# Patient Record
Sex: Female | Born: 1960 | Race: White | Hispanic: No | Marital: Married | Smoking: Former smoker
Health system: Southern US, Community
[De-identification: ages and names within clinical notes are randomized; demographics above are authoritative.]

## PROBLEM LIST (undated history)

## (undated) DIAGNOSIS — I10 Essential (primary) hypertension: Secondary | ICD-10-CM

## (undated) DIAGNOSIS — G629 Polyneuropathy, unspecified: Secondary | ICD-10-CM

## (undated) DIAGNOSIS — K219 Gastro-esophageal reflux disease without esophagitis: Secondary | ICD-10-CM

## (undated) DIAGNOSIS — F419 Anxiety disorder, unspecified: Secondary | ICD-10-CM

## (undated) DIAGNOSIS — J9601 Acute respiratory failure with hypoxia: Secondary | ICD-10-CM

## (undated) DIAGNOSIS — E119 Type 2 diabetes mellitus without complications: Secondary | ICD-10-CM

## (undated) DIAGNOSIS — F329 Major depressive disorder, single episode, unspecified: Secondary | ICD-10-CM

## (undated) DIAGNOSIS — T4145XA Adverse effect of unspecified anesthetic, initial encounter: Secondary | ICD-10-CM

## (undated) DIAGNOSIS — I341 Nonrheumatic mitral (valve) prolapse: Secondary | ICD-10-CM

## (undated) DIAGNOSIS — R6521 Severe sepsis with septic shock: Secondary | ICD-10-CM

## (undated) DIAGNOSIS — D649 Anemia, unspecified: Secondary | ICD-10-CM

## (undated) DIAGNOSIS — D125 Benign neoplasm of sigmoid colon: Secondary | ICD-10-CM

## (undated) DIAGNOSIS — J45909 Unspecified asthma, uncomplicated: Secondary | ICD-10-CM

## (undated) DIAGNOSIS — D696 Thrombocytopenia, unspecified: Secondary | ICD-10-CM

## (undated) DIAGNOSIS — R109 Unspecified abdominal pain: Secondary | ICD-10-CM

## (undated) DIAGNOSIS — R000 Tachycardia, unspecified: Secondary | ICD-10-CM

## (undated) DIAGNOSIS — J449 Chronic obstructive pulmonary disease, unspecified: Secondary | ICD-10-CM

## (undated) DIAGNOSIS — G4730 Sleep apnea, unspecified: Secondary | ICD-10-CM

## (undated) DIAGNOSIS — N200 Calculus of kidney: Secondary | ICD-10-CM

## (undated) DIAGNOSIS — M1990 Unspecified osteoarthritis, unspecified site: Secondary | ICD-10-CM

## (undated) DIAGNOSIS — I493 Ventricular premature depolarization: Secondary | ICD-10-CM

## (undated) DIAGNOSIS — F32A Depression, unspecified: Secondary | ICD-10-CM

## (undated) DIAGNOSIS — A419 Sepsis, unspecified organism: Secondary | ICD-10-CM

## (undated) DIAGNOSIS — K5792 Diverticulitis of intestine, part unspecified, without perforation or abscess without bleeding: Secondary | ICD-10-CM

## (undated) DIAGNOSIS — M5432 Sciatica, left side: Secondary | ICD-10-CM

## (undated) DIAGNOSIS — T8859XA Other complications of anesthesia, initial encounter: Secondary | ICD-10-CM

## (undated) DIAGNOSIS — K5732 Diverticulitis of large intestine without perforation or abscess without bleeding: Secondary | ICD-10-CM

## (undated) DIAGNOSIS — R339 Retention of urine, unspecified: Secondary | ICD-10-CM

## (undated) HISTORY — DX: Diverticulitis of large intestine without perforation or abscess without bleeding: K57.32

## (undated) HISTORY — DX: Acute respiratory failure with hypoxia: J96.01

## (undated) HISTORY — DX: Unspecified abdominal pain: R10.9

## (undated) HISTORY — DX: Ventricular premature depolarization: I49.3

## (undated) HISTORY — PX: IMAGE GUIDED SINUS SURGERY: SHX6570

## (undated) HISTORY — PX: GANGLION CYST EXCISION: SHX1691

## (undated) HISTORY — DX: Calculus of kidney: N20.0

## (undated) HISTORY — DX: Benign neoplasm of sigmoid colon: D12.5

## (undated) HISTORY — DX: Retention of urine, unspecified: R33.9

## (undated) HISTORY — DX: Tachycardia, unspecified: R00.0

## (undated) HISTORY — DX: Gastro-esophageal reflux disease without esophagitis: K21.9

## (undated) HISTORY — DX: Anxiety disorder, unspecified: F41.9

## (undated) HISTORY — DX: Essential (primary) hypertension: I10

## (undated) HISTORY — PX: ABDOMINAL HYSTERECTOMY: SHX81

## (undated) HISTORY — DX: Thrombocytopenia, unspecified: D69.6

## (undated) HISTORY — DX: Anemia, unspecified: D64.9

## (undated) HISTORY — PX: ROTATOR CUFF REPAIR: SHX139

## (undated) HISTORY — DX: Nonrheumatic mitral (valve) prolapse: I34.1

## (undated) HISTORY — PX: COLECTOMY: SHX59

## (undated) HISTORY — DX: Depression, unspecified: F32.A

## (undated) HISTORY — DX: Major depressive disorder, single episode, unspecified: F32.9

---

## 2004-10-25 ENCOUNTER — Ambulatory Visit: Payer: Self-pay | Admitting: Internal Medicine

## 2005-02-24 ENCOUNTER — Emergency Department: Payer: Self-pay | Admitting: Unknown Physician Specialty

## 2005-04-10 ENCOUNTER — Emergency Department: Payer: Self-pay | Admitting: Emergency Medicine

## 2005-04-10 ENCOUNTER — Other Ambulatory Visit: Payer: Self-pay

## 2005-05-03 ENCOUNTER — Emergency Department: Payer: Self-pay | Admitting: Emergency Medicine

## 2005-06-02 ENCOUNTER — Ambulatory Visit (HOSPITAL_COMMUNITY): Admission: RE | Admit: 2005-06-02 | Discharge: 2005-06-02 | Payer: Self-pay | Admitting: Neurosurgery

## 2005-07-02 ENCOUNTER — Ambulatory Visit (HOSPITAL_COMMUNITY): Admission: RE | Admit: 2005-07-02 | Discharge: 2005-07-03 | Payer: Self-pay | Admitting: Neurosurgery

## 2005-08-28 ENCOUNTER — Ambulatory Visit: Payer: Self-pay

## 2005-12-31 ENCOUNTER — Encounter: Admission: RE | Admit: 2005-12-31 | Discharge: 2005-12-31 | Payer: Self-pay | Admitting: Neurosurgery

## 2006-04-01 ENCOUNTER — Ambulatory Visit: Payer: Self-pay

## 2006-06-18 ENCOUNTER — Ambulatory Visit: Payer: Self-pay | Admitting: Internal Medicine

## 2006-08-25 HISTORY — PX: AUGMENTATION MAMMAPLASTY: SUR837

## 2006-10-13 ENCOUNTER — Ambulatory Visit: Payer: Self-pay | Admitting: Specialist

## 2007-02-24 ENCOUNTER — Ambulatory Visit: Payer: Self-pay | Admitting: Urology

## 2007-04-02 ENCOUNTER — Ambulatory Visit: Payer: Self-pay | Admitting: Urology

## 2007-04-02 ENCOUNTER — Emergency Department: Payer: Self-pay | Admitting: Emergency Medicine

## 2007-04-02 ENCOUNTER — Other Ambulatory Visit: Payer: Self-pay

## 2007-04-08 ENCOUNTER — Ambulatory Visit: Payer: Self-pay | Admitting: Urology

## 2007-04-22 ENCOUNTER — Ambulatory Visit: Payer: Self-pay | Admitting: Urology

## 2007-06-22 ENCOUNTER — Ambulatory Visit: Payer: Self-pay | Admitting: Internal Medicine

## 2007-07-15 ENCOUNTER — Ambulatory Visit: Payer: Self-pay | Admitting: Urology

## 2007-07-31 ENCOUNTER — Ambulatory Visit: Payer: Self-pay | Admitting: Cardiothoracic Surgery

## 2007-07-31 ENCOUNTER — Inpatient Hospital Stay (HOSPITAL_COMMUNITY): Admission: EM | Admit: 2007-07-31 | Discharge: 2007-08-02 | Payer: Self-pay | Admitting: Emergency Medicine

## 2007-08-06 ENCOUNTER — Ambulatory Visit: Payer: Self-pay | Admitting: Cardiothoracic Surgery

## 2007-08-06 ENCOUNTER — Encounter: Admission: RE | Admit: 2007-08-06 | Discharge: 2007-08-06 | Payer: Self-pay | Admitting: Cardiothoracic Surgery

## 2007-08-13 ENCOUNTER — Ambulatory Visit: Payer: Self-pay | Admitting: Cardiothoracic Surgery

## 2007-08-13 ENCOUNTER — Encounter: Admission: RE | Admit: 2007-08-13 | Discharge: 2007-08-13 | Payer: Self-pay | Admitting: Cardiothoracic Surgery

## 2007-10-18 ENCOUNTER — Emergency Department: Payer: Self-pay | Admitting: Unknown Physician Specialty

## 2007-12-15 ENCOUNTER — Ambulatory Visit: Payer: Self-pay | Admitting: Urology

## 2008-01-10 ENCOUNTER — Ambulatory Visit: Payer: Self-pay | Admitting: Chiropractor

## 2008-01-10 ENCOUNTER — Emergency Department: Payer: Self-pay | Admitting: Internal Medicine

## 2008-03-28 IMAGING — CR DG ABDOMEN 1V
1 series · 2 of 2 positions shown · non-contrast
Comparison: none

REASON FOR EXAM: NEPHROLITHISIS PT NEED FILMS
COMMENTS:

[Series 1: view not recorded · 0.17mm/px · 2 of 2 slices shown]
[im 1/2]
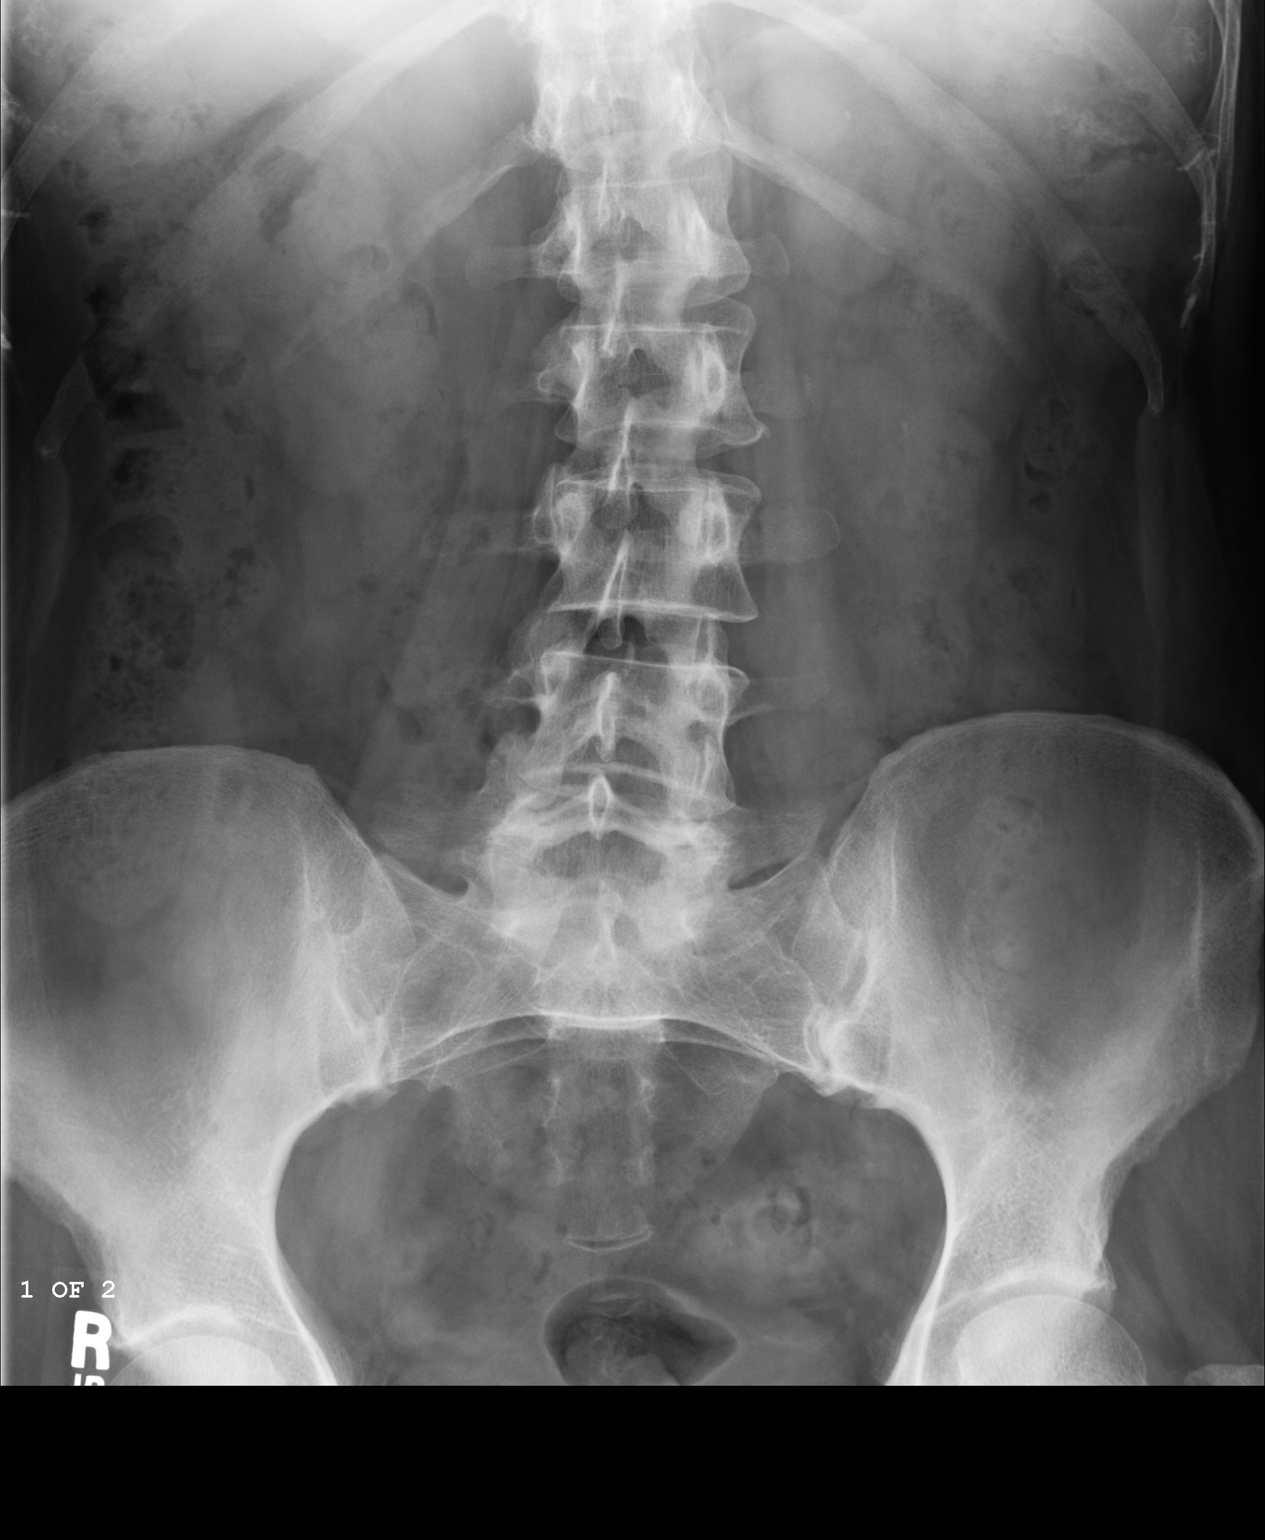
[im 2/2]
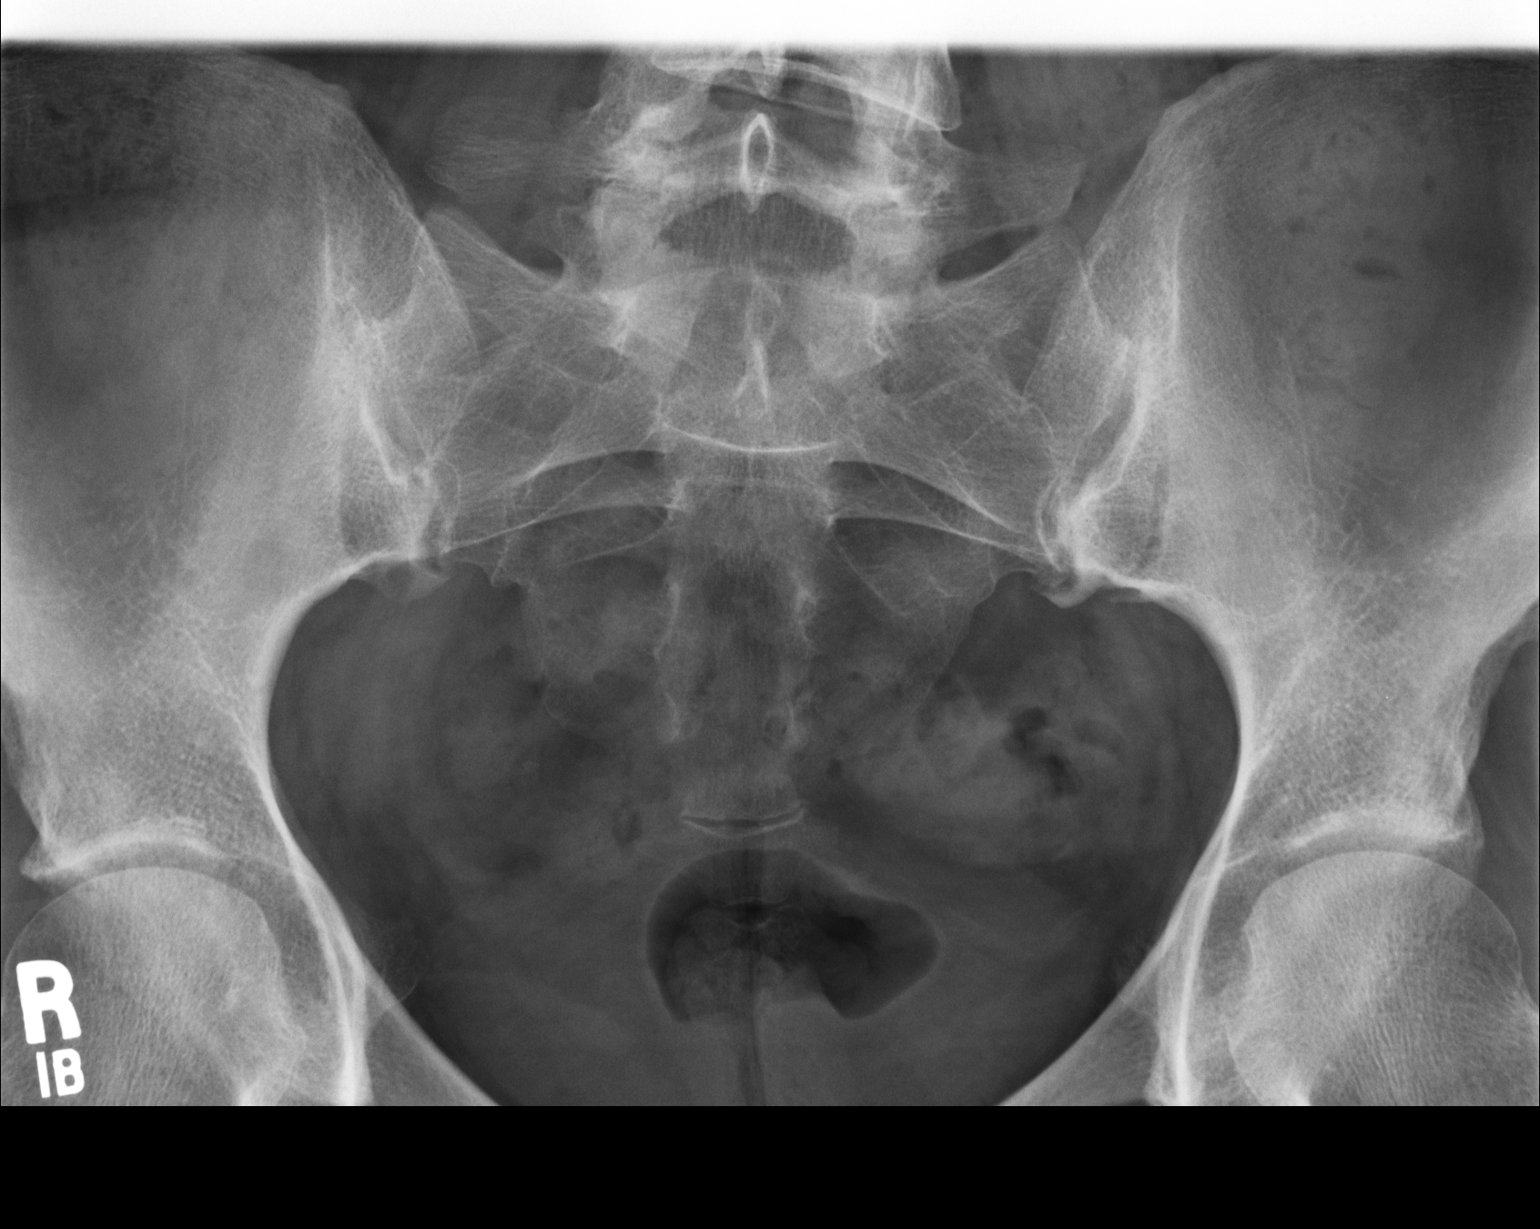

[2 of 2 positions shown; findings below may reference images not displayed]

PROCEDURE:     DXR - DXR KIDNEY URETER BLADDER  - April 22, 2007 [DATE]

RESULT:     Comparison is made to the prior exam of 04/08/07. The previously
present calcifications at the lower pole of the LEFT kidney are less
prominent. There are still a few tiny sand-like calcifications present in
this region.  Additionally, there is again noted a tiny 2-3 mm calcification
in the upper pole of the LEFT kidney. No definite ureteral calcifications
are seen. No RIGHT renal stones are identified on plain film examination.
IMPRESSION: Please see above.

## 2008-05-25 ENCOUNTER — Ambulatory Visit: Payer: Self-pay | Admitting: Urology

## 2008-07-19 IMAGING — CR DG CHEST 2V
2 series · 2 of 2 positions shown · non-contrast
Comparison: 08/06/07.

CLINICAL DATA: Left pneumothorax, short of breath, follow-up.
 CHEST - 2 VIEWS:

[w chest pa]
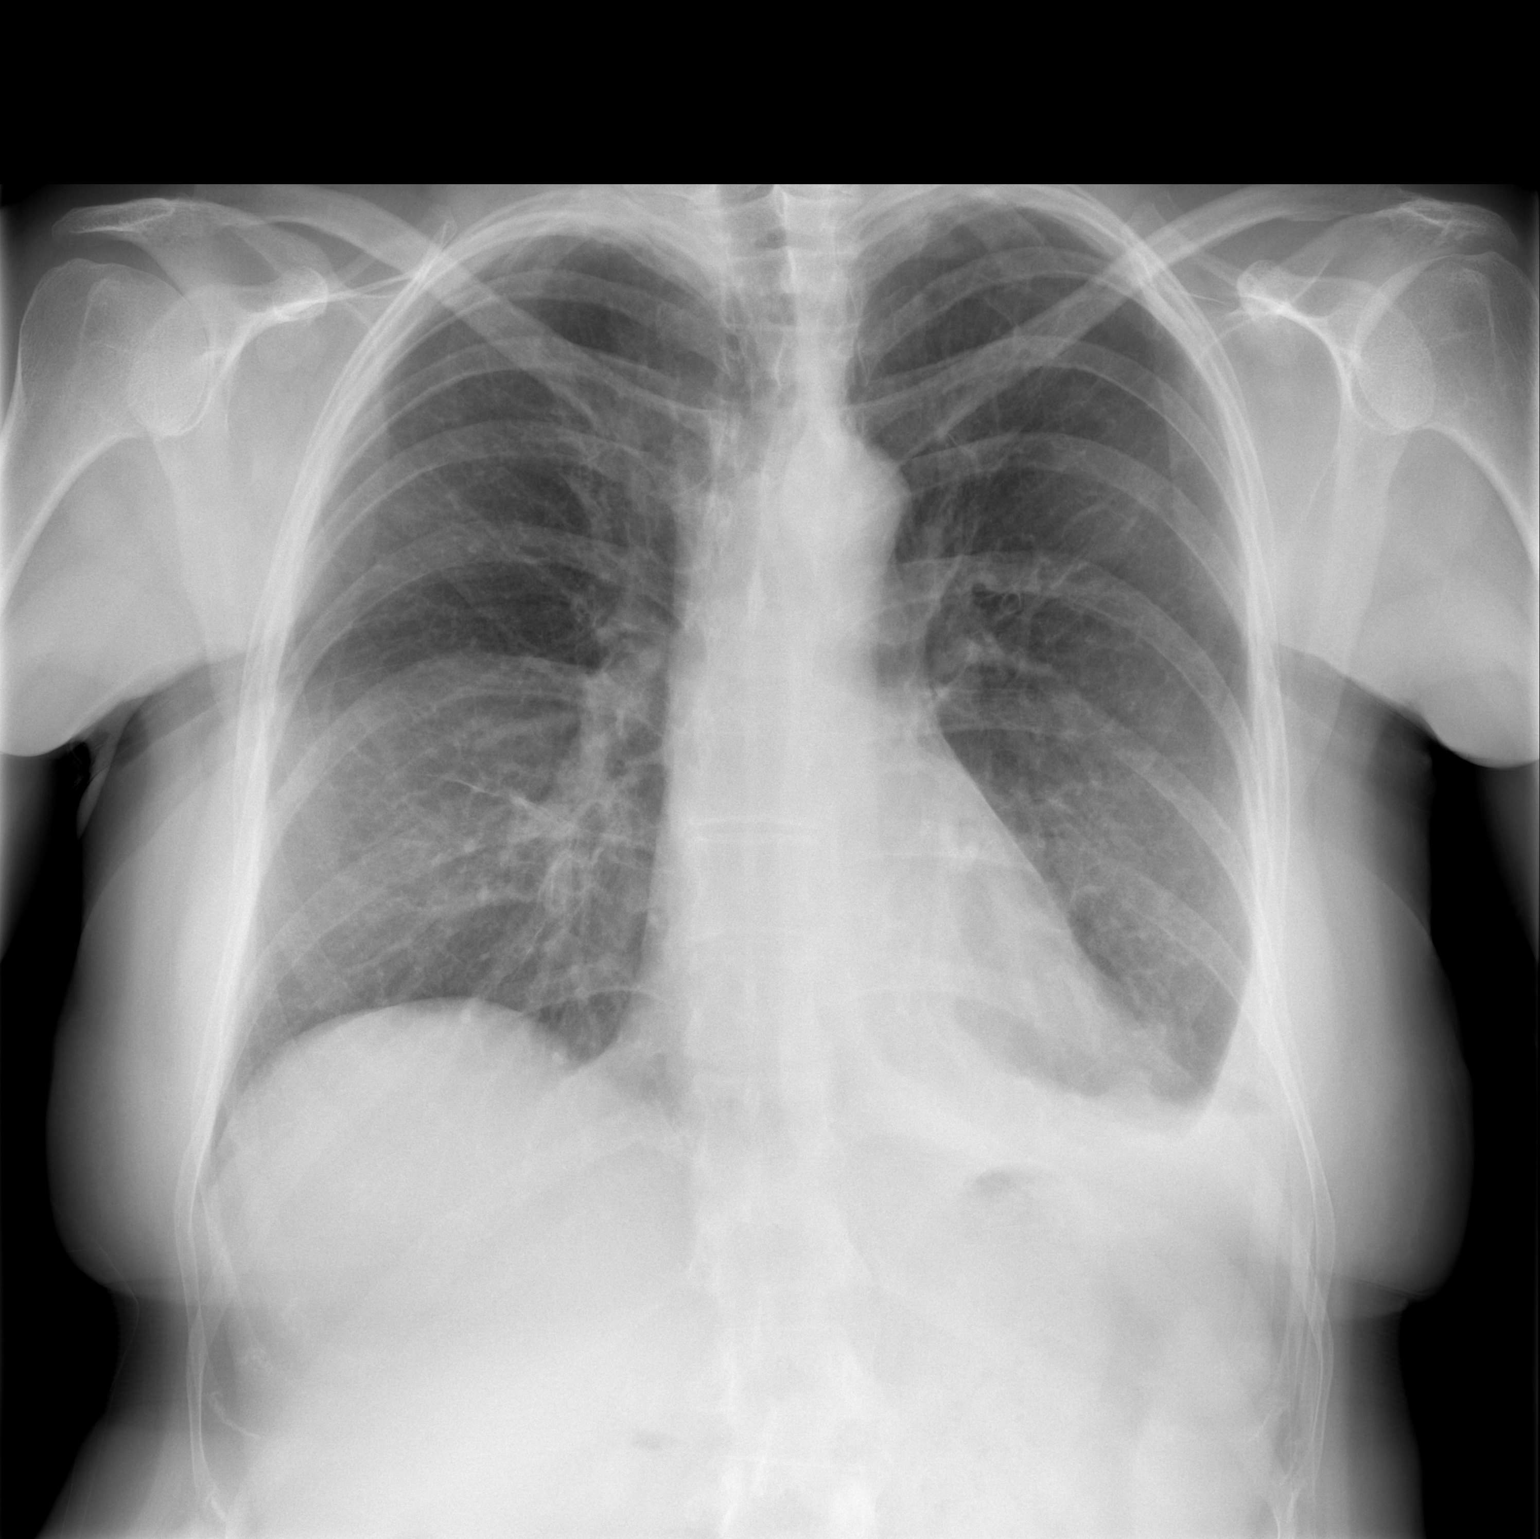

[w chest lat]
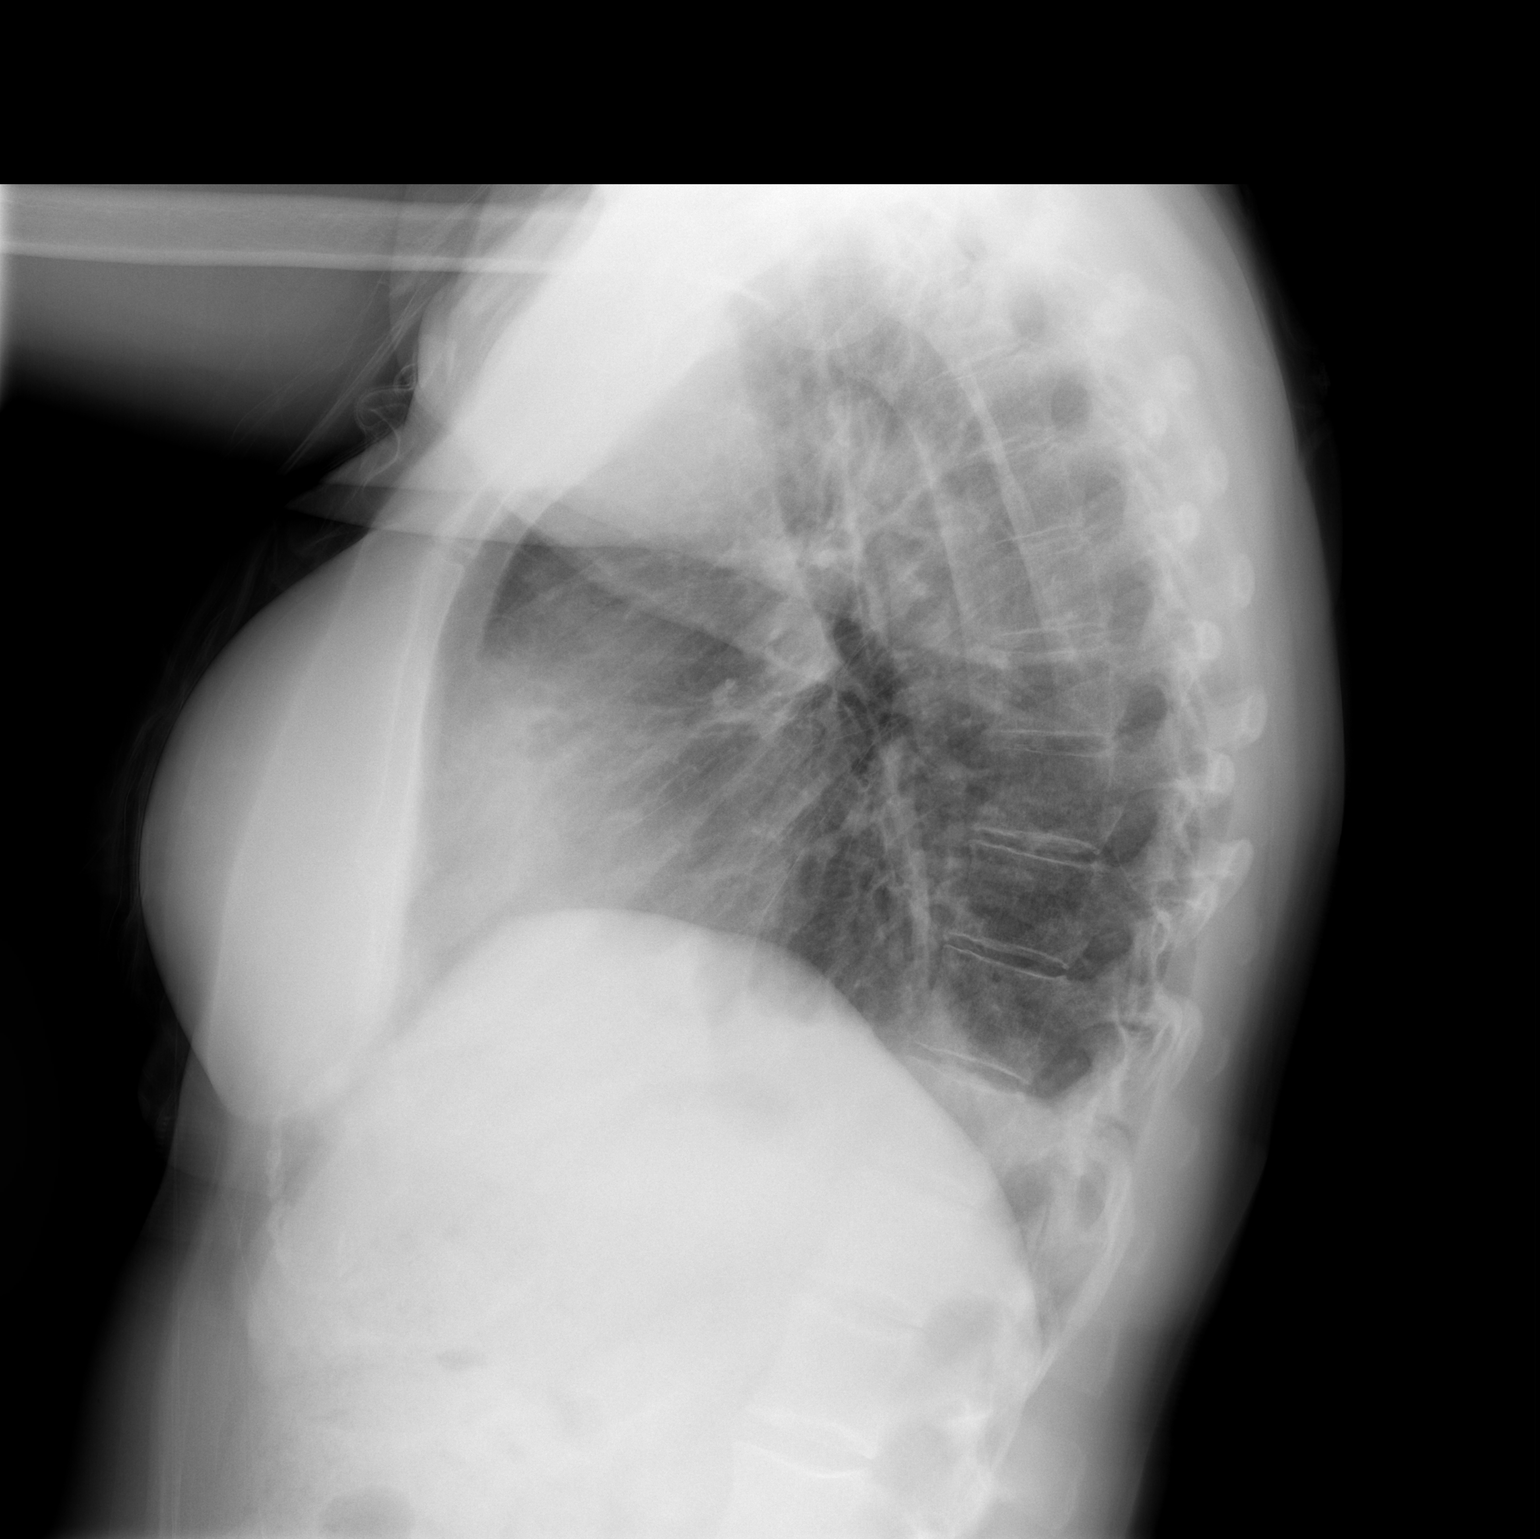

[2 of 2 positions shown; findings below may reference images not displayed]

FINDINGS: Two views of the chest show resolution of the small left pneumothorax noted previously.  Aeration has improved with decreasing basilar atelectasis.  A small left pleural effusion remains.  Heart size is stable.
IMPRESSION: 1.  No pneumothorax.
 2.  Improve aeration.
 3.  Small left effusion remains.

## 2008-12-15 ENCOUNTER — Ambulatory Visit: Payer: Self-pay | Admitting: Internal Medicine

## 2011-01-07 NOTE — Consult Note (Signed)
NAME:  Grace Hester, Grace Hester            ACCOUNT NO.:  [id number]   MEDICAL RECORD NO.:  [id number]          PATIENT TYPE:  INP   LOCATION:  3730                         FACILITY:  [facility]   PHYSICIAN:  Brantley Persons, M.D.DATE OF BIRTH:  Jan 07, 1961   DATE OF CONSULTATION:  07/30/2007  DATE OF DISCHARGE:                                 CONSULTATION   BRIEF HISTORY OF PRESENT ILLNESS:  The patient is a 50 year old  Caucasian female who is status post bilateral augmentation mammoplasty  surgery.  She presents this evening with complaints of some difficulty  breathing, as well as left lower chest/diaphragm pain.  The patient was  sent to the radiology department where a chest x-ray indicates that she  has a small left with pneumothorax.  She is therefore evaluated and will  be admitted.   PAST MEDICAL HISTORY:  1. History of mitral valve prolapse with a tachyarrhythmia that is      well controlled with verapamil.  2. History of kidney stones that in the past have needed removal of      the kidney stones, as well as lithotripsy.  3. History of allergies.  4. History of occasional ankle edema that is controlled with      hydrochlorothiazide.  (Denies other cardiac, lung, liver or kidney disease.)   PAST SURGICAL HISTORY:  1. Excision of ganglion cyst left wrist 8-9 years ago.  2. September 2004 - kidney stone removal.  3. September 2008 - lithotripsy for kidney stones.  4. January 2005 - hysterectomy with removal of one ovary.  5. 2007 - lumbar laminectomy.   CURRENT MEDICATIONS:  1. Verapamil 180 mg p.o. daily.  2. Lexapro 10 mg one p.o. b.i.d.  3. Hydrochlorothiazide 12.5 mg p.o. daily.  4. Fexofenadine 80 mg p.o. daily.  5. Potassium supplement 10 mEq daily.  6. Xanax 0.5 mg p.o. daily p.r.n.  7. Vitamin C 1000 mg p.o. daily.   ALLERGIES:  1. LATEX BAND-AIDS.  2. CATS.  3. DOGS.   SOCIAL HISTORY:  The patient quit smoking about 4 years ago, but she  used to smoke about a  half a pack per day of cigarettes.  Social alcohol  use.   PHYSICAL EXAMINATION:  GENERAL:  Well-developed, well-nourished 46-year-  old Caucasian female in no acute distress.  HEENT:  Normocephalic.  Pupils are equal, round and reactive to light.  Extraocular movements intact.  Oropharynx without erythema.  NECK:  Supple, nontender.  CHEST/LUNGS:  Slightly decreased breath sounds at the left apex with a  few wheezes, but breath sounds are present bilaterally.  Also a few  slightly decreased breath sounds present at the left base.  Right lung  with normal breath sounds.  The patient is wearing her postoperative  support bra from the surgery.  HEART:  Rate and rhythm regular.  EXTREMITIES:  2+ radial and dorsalis pedis pulses present.  NEUROLOGIC:  Cranial nerves II-XII grossly intact.  No focal motor or  sensory deficits.   ACCESSORY CLINICAL DATA:  PA and lateral chest x-ray indicates an  approximately 10-15% left apical pneumothorax, a small amount of fluid  present  at the left lung base, mild atelectasis and some mild increased  pulmonary fluid/edema.   IMPRESSION:  Left pneumothorax with mild pulmonary edema and  atelectasis.  Since the patient is a little clinically symptomatic from  the pneumothorax, it will be appropriate to admit her.  The question is  whether or not to place a chest tube.  Dr. Kathlee Nations Trigt was  consulted, and he agrees that at this time she does not need a chest  tube.  Rather, the patient will be admitted to a 23-hour observation  bed, and her chest x-ray will be repeated in the morning.  She will also  be kept on supplemental oxygen overnight for comfort, as well as to  possibly help the pneumothorax be absorbed.  Due to the pulmonary edema  that is present, I will also give the patient some IV Lasix to help  remove the intraoperative fluid overload that she most likely received.  The patient will be admitted to a telemetry bed to monitor her cardiac   status, as well as she will be kept on pulse oximetry.  When she came in  to the ER, she had an O2 saturation of 92% on room air.  With  supplemental oxygen, she goes up to 96% on 2 liters of oxygen.  If any  point the patient's condition should worsen, then a chest tube may need  to be placed.  She will also be given proper pain relief to assist with  allowing her to breathe better and hopefully help re-expand her lungs.  The patient will also be given an incentive spirometer and encouraged to  use it frequently.  Dr. Donata Clay will be consulting on the patient  while she is in the hospital.  In the ER, she is a comfortable after  being given pain medications, and some of the anxiety that she had with  her shortness of breath has significantly improved.  The patient is  admitted to a 23-hour observation bed, and we will follow her in the  hospital.           ______________________________  Brantley Persons, M.D.     MC/MEDQ  D:  08/01/2007  T:  08/01/2007  Job:  [id number]

## 2011-01-07 NOTE — Discharge Summary (Signed)
NAME:  Grace Hester, Grace Hester            ACCOUNT NO.:  [id number]   MEDICAL RECORD NO.:  [id number]          PATIENT TYPE:  INP   LOCATION:  3730                         FACILITY:  [facility]   PHYSICIAN:  Kerin Perna, M.D.  DATE OF BIRTH:  1961/02/17   DATE OF ADMISSION:  07/30/2007  DATE OF DISCHARGE:                               DISCHARGE SUMMARY   PHYSICIAN REQUESTING CONSULTATION:  Dr. Sherald Hess.   REASON FOR CONSULTATION:  Left pneumothorax.   CHIEF COMPLAINT:  Shortness of breath and chest discomfort.   HISTORY OF PRESENT ILLNESS:  I was asked to evaluate this 50 year old  white female for evaluation and therapy of a recently diagnosed left 10%  to 15% pneumothorax occurring after a bilateral breast augmentation  operation by Dr. Sherald Hess earlier today.  The procedure went  uneventfully under general anesthesia.  There has been no prior history  of pulmonary disease, asthma, thoracic trauma or prior spontaneous  pneumothorax.  Following discharge home, the patient developed some  shortness of breath and left chest discomfort and was evaluated in the  emergency room, where her oxygen saturation on room air was 91%.  A  chest x-ray was taken, which showed a small left apical 10% to 15%  pneumothorax with a small air-fluid level at the costophrenic angle  noted on the lateral view.  There was perhaps some mild interstitial  edema, although the heavy thoracic dressings and breast prostheses  affected the appearance of the lungs on chest x-ray.  The patient was  admitted to the hospital for overnight observation and a followup chest  x-ray was taken this morning.  This shows a stable left 15% pneumothorax  without increased air-fluid level and, in fact, the air-fluid level  appears to be resolving.  She still has discomfort and some pleuritic  pain on the left,  as well as generalized anterior chest wall pain from  the breast implant procedure.  She has a fairly restrictive  thoracic  binder on as well and the incentive spirometer volume is 700-750 mL.   PAST MEDICAL HISTORY:  1. Depression.  2. Mitral valve prolapse.   ALLERGIES:  NO KNOWN DRUG ALLERGIES.   SOCIAL HISTORY:  Nonsmoker, nondrinker, married.   FAMILY HISTORY:  Negative for spontaneous pneumothorax.   REVIEW OF SYSTEMS:  No cardiac arrhythmia associated with mitral valve  prolapse.  No difficulty swallowing or dental problems.  No recent falls  or trauma.  No history of asthma, recent URI or history of abnormal  chest x-ray in the past.  No history of neurologic problems vascular  disease, or endocrine disorder.   PHYSICAL EXAM:  VITAL SIGNS:  Blood pressure is 128/86, pulse 94, sinus,  respirations 20.  Saturation on 2 L is 97% to 98%.  GENERAL APPEARANCE:  This is a middle-aged white female in her hospital  room, anxious and with some pain and discomfort from her chest wall  surgery.  She has a tight thoracic binder around her chest  circumferentially.  HEENT:  Normocephalic.  NECK:  Without crepitus, JVD or mass.  LUNGS:  Breath sounds are clear and equal.  CARDIAC:  Exam reveals regular rhythm without S3 gallop or murmur.  ABDOMEN:  Soft.  EXTREMITIES:  Reveal warm extremities with good pulses and no edema or  tenderness.  NEUROLOGIC:  Exam is intact.   LABORATORY DATA:  I reviewed the chest x-rays from last night and this  morning and she has a stable to small left pneumothorax, probably  related to positive pressure ventilation or increased intrathoracic  pressure that developed during the procedure.   I would recommend conservative management with serial x-rays and  observation and maintain nasal cannula oxygen to help reabsorb the  pneumothorax.  I would not recommend chest tube placement because of the  small size of the pneumothorax, its stable appearance on serial x-ray,  and logistic difficulties of placing a chest tube with the large chest  binder dressing in place.   I feel that the condition should improve over  the next 24-48 hours so that she could followed as an outpatient.   Thank you for the consultation.      Kerin Perna, M.D.  Electronically Signed     PV/MEDQ  D:  07/31/2007  T:  08/01/2007  Job:  [id number]

## 2011-01-07 NOTE — Assessment & Plan Note (Signed)
OFFICE VISIT   Grace Hester, Grace Hester  DOB:  [birth year]                                        August 06, 2007  CHART #:  [id number]   CURRENT PROBLEMS:  1. Left pneumothorax associated with bilateral breast augmentation      07/30/2007 (10-15%).  2. History of depression.  3. History of mitral valve prolapse.   HISTORY OF PRESENT ILLNESS:  The patient is a 50 year old nonsmoker who  underwent bilateral breast augmentation by Dr. Brantley Persons  07/30/2007.  Postoperatively, she had left pleuritic chest pain and  chest x-ray showed a left 10% pneumothorax.  She was hospitalized and  observed over the next 72 hours with improvement in symptoms,  improvement in oxygen saturation, and decreased pleuritic pain.  She  returns now for a followup chest x-ray.  Overall, she feels stronger.  She has been using her incentive spirometer and takes a walk daily.   PHYSICAL EXAM:  Blood pressure 130/80, pulse 98 and regular,  respirations 18, saturation 94% on room air.  She is alert and oriented.  Her breath sounds are clear and equal.  Her pulse is regular and her  neurologic exam is intact.   PA and lateral chest x-ray today shows improved left pneumothorax with a  pneumothorax now at the apex less than 5%.  She has some mild basalar  atelectasis right greater than left lung.   IMPRESSION AND PLAN:  Her spontaneous pneumothorax is gradually  improving.  She can continue her rehab activities, but avoid heavy  lifting, and she will be checked with an x-ray in 1 week.   Kerin Perna, M.Hester.  Electronically Signed   PV/MEDQ  Hester:  08/06/2007  T:  08/06/2007  Job:  [id number]   cc:   Brantley Persons, M.Hester.

## 2011-01-07 NOTE — Assessment & Plan Note (Signed)
OFFICE VISIT   Grace Hester, Grace Hester  DOB:  1961/07/25                                        August 13, 2007  CHART #:  [id number]   Patient presents to our office today for followup of a left pneumothorax  associated with bilateral breast augmentation on July 30, 2007.   Patient is without complaints today.  Denies any chest pain or shortness  of breath.  She is up ambulating well.  Has a follow-up appointment with  Dr. Sherald Hess today.   PHYSICAL EXAMINATION:  Vitals:  Blood pressure 118/72, pulse 107,  respirations 18, O2 sats 96%.  Respiratory:  Clear to auscultation  bilaterally.  Cardiac:  Regular rate and rhythm.  S1 and S2 noted.  Extremities:  All warm to touch.   STUDIES:  Patient's chest x-ray today, August 13, 2007.  The chest x-  ray is clear.  No pneumothorax noted.  No acute findings noted.   IMPRESSION/PLAN:  Patient is seen status post left pneumothorax  associated with bilateral breast augmentation.  On x-ray today, this  appears to have healed completely.  The lung has reexpanded.  Discussed  with patient.  Will release her from our office.  If develops any  complications or questions, such as shortness of breath or chest pain,  contact us.  Patient is to continue following with her plastic surgeon  as directed.   Kerin Perna, M.Hester.  Electronically Signed   KMD/MEDQ  Hester:  08/13/2007  T:  08/14/2007  Job:  [id number]   cc:   Brantley Persons, M.Hester.

## 2011-01-10 NOTE — Op Note (Signed)
NAME:  Grace Hester, Grace Hester NO.:  [id number]   MEDICAL RECORD NO.:  [id number]           PATIENT TYPE:   LOCATION:                                 FACILITY:   PHYSICIAN:  Reinaldo Meeker, M.D.      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   PROCEDURE:  Microdissection L4-5 disk and L5 nerve root.   SURGEON:  Reinaldo Meeker, M.D.   ASSISTANT:  Kathaleen Maser. Pool, M.D.   PROCEDURE IN DETAIL:  After being placed in the prone position, prepped and  draped in the usual sterile fashion. A low-exposure x-ray was taken prior to  incision to identify the appropriate level. A midline incision was made  above the spinous processes of L4 and L5.  Using the Bovie cutting current  the incision was carried down to the spinous processes.  Subperiosteal  dissection was then carried out along the left side of the spinous processes  and lamina and a self-retaining retractor was placed for exposure. X-rays  showed approach at the appropriate level.   Using a high-speed drill the inferior one-half of the L4 lamina and the  medial one-third of the facet joint was removed.  A drill was then used to  remove the superior one-third of the L5 lamina.  The residual bone and  ligamenta flava were removed in a piecemeal fashion.  The microscope was  draped and brought out onto the field, and __________ remainder of the case.   Using micro dissectors, the lateral aspect of the thecal sac and L5 nerve  root were identified.  Physical coagulation was carried out at the L5 nerve  root. At this point, inspection was carried out in all directions for any  evidence of residual compression, and none could be identified.  Large  amounts of irrigation were  carried out.  Interbody staples were placed on  the skin.  Sterile dressing was then applied; and the patient was extubated  and taken to recovery room in stable condition.           ______________________________  Reinaldo Meeker, M.D.     ROK/MEDQ  D:  07/02/2005  T:  07/02/2005  Job:  [id number]

## 2011-01-10 NOTE — Discharge Summary (Signed)
NAME:  Grace Hester, Grace Hester            ACCOUNT NO.:  [id number]   MEDICAL RECORD NO.:  [id number]          PATIENT TYPE:  INP   LOCATION:  3730                         FACILITY:  [facility]   PHYSICIAN:  Brantley Persons, M.D.DATE OF BIRTH:  February 04, 1961   DATE OF ADMISSION:  07/30/2007  DATE OF DISCHARGE:  08/02/2007                               DISCHARGE SUMMARY   ADMITTING DIAGNOSIS:  Left apical pneumothorax.   DISCHARGE DIAGNOSIS:  Residual left apical pneumothorax is clinically  stable.   BRIEF HISTORY OF PRESENT ILLNESS:  The patient is a 50 year old,  Caucasian female, who is status post bilateral augmentation mammoplasty  surgery on July 30, 2007.  She presented on the evening of July 30, 2007, with complaints of difficulty breathing as well as left lower  chest/diaphragmatic pain.  She was seen in the ER for evaluation.  The  procedure went uneventfully under general anesthesia.  After having been  discharged home, the patient had developed some shortness of breath as  well as a left chest discomfort.  On room air, her oxygenation was 91%.  A chest x-ray was taken, which showed a small, apical, 10 to 15%  pneumothorax with a small air fluid level at the left costophrenic angle  noted on the lateral view.  There was also some possible mild  interstitial edema present.  The patient was admitted to the hospital  for overnight observation and a followup chest x-ray was ordered for the  following morning.  She was also placed on cardiac monitoring as well as  supplemental oxygen by nasal cannula.  This helped with reabsorption of  the pneumothorax.   PAST MEDICAL HISTORY:  1. History of mitral valve prolapse with a tachyarrhythmia that is      well-controlled with Verapamil.  2. History of kidney stones and in the past have needed removal of the      kidney stones as well as lithotripsy.  3. History of allergies.  4. History of occasional ankle edema that is controlled  with      hydrochlorothiazide.  5. Denies other cardiac, lung, liver, or kidney disease.   PAST SURGICAL HISTORY:  1. Excision of ganglion cyst, left wrist, eight to nine years ago.  2. September of 2004, kidney stone removal.  3. September of 2008, lithotripsy for kidney stones.  4. January of 2005, hysterectomy with removal of one ovary.  5. 2007, lumbar laminectomy.   CURRENT MEDICATIONS:  1. Verapamil 180 mg p.o. daily.  2. Lexapro 10 mg p.o. b.i.d.  3. Hydrochlorothiazide 12.5 mg p.o. daily.  4. Fexofenadine 80 mg p.o. daily.  5. Potassium supplement 10 mEq daily.  6. Xanax 0.5 mg daily p.o. p.r.n.  7. Vitamin C 1000 mg p.o. daily.   ALLERGIES:  1. LATEX BAND-AID'S.  2. CATS.  3. DOGS.   SOCIAL HISTORY:  The patient quit smoking about four years ago, but she  used to smoke about a half a pack per day of cigarettes.  Social alcohol  use.   HOSPITAL COURSE:  The patient was initially admitted overnight for  observation.  A followup chest x-ray the  morning after admission noted  that she had a persistent left apical pneumothorax along with some mild  pulmonary edema.  She had been given IV Lasix to help with the diuresis,  and although the interstitial edema has been improved, there is still  some present.  However, she did remain a little clinically symptomatic  in that she was still a little short of breath, although she did have  clear breath sounds bilaterally.  She showed no evidence of any cardiac  arrhythmia or instability on her telemetry.  It was decided to change  the patient to a regular admission and continue to monitor her in the  hospital with supplemental nasal oxygen by cannula.  Also, a followup  chest x-ray was going to be once again performed.  We continued with the  patient's ambulation in the hospital.  She did have some issues with  pain control, and once we were able to get her pain under better control  she was able to breathe better and was much  more comfortable.  She did  have surgery on the 5th and so, of course, she had typical pain related  to her breast surgery.  By the morning of August 02, 2007, the patient  was feeling good.  Her pain was under good control.  She was able to  breathe easy and was not short of breath.  She had minimal complaints of  left-sided chest pain and just had appropriate postoperative breast pain  related to her surgery.  On chest x-ray, the left apical pneumothorax  appeared stable and possibly smaller.  She had maintained good oxygen  saturations off of her supplemental oxygen.  Her breath sounds were  clear.  Dr. Donata Clay had been consulted on the patient at the time of  her admission, and he felt that the patient did not need to have a chest  tube inserted as her pneumothorax was stable and the patient was stable,  although she did exhibit some clinical symptoms.  But he felt that it  would be appropriate for her to be discharged home today and that he  would be happy to follow her up clinically in his office later this week  with a repeat chest x-ray and physical exam.  I will follow the patient  as well.  We therefore decided to discharge her home on August 02, 2007.  At the time of her discharge, she was clinically stable without  any difficulty in breathing.  Followup appointment will be with Dr. Donata Clay on Friday, and she will then come to my office on Friday after  having seen Dr. Donata Clay that morning.  The patient is instructed to  call our office or return to the emergency room should she experience  any difficulty in breathing or increase in her left-sided chest pain.           ______________________________  Brantley Persons, M.D.     MC/MEDQ  D:  09/29/2007  T:  09/29/2007  Job:  [id number]

## 2011-04-09 ENCOUNTER — Ambulatory Visit: Payer: Self-pay | Admitting: Family Medicine

## 2011-06-02 LAB — DIFFERENTIAL
Basophils Absolute: 0
Basophils Relative: 0
Eosinophils Absolute: 0 — ABNORMAL LOW
Eosinophils Relative: 0
Lymphocytes Relative: 4 — ABNORMAL LOW
Lymphs Abs: 0.5 — ABNORMAL LOW
Monocytes Absolute: 0.3
Monocytes Relative: 2 — ABNORMAL LOW
Neutro Abs: 13.8 — ABNORMAL HIGH
Neutrophils Relative %: 94 — ABNORMAL HIGH

## 2011-06-02 LAB — BASIC METABOLIC PANEL
BUN: 6
BUN: 7
CO2: 21
CO2: 31
Calcium: 9.3
Calcium: 9.4
Chloride: 101
Chloride: 101
Creatinine, Ser: 0.64
Creatinine, Ser: 0.77
GFR calc Af Amer: >60
GFR calc Af Amer: >60
GFR calc non Af Amer: >60
GFR calc non Af Amer: >60
Glucose, Bld: 106 — ABNORMAL HIGH
Glucose, Bld: 187 — ABNORMAL HIGH
Potassium: 3.4 — ABNORMAL LOW
Potassium: 3.7
Sodium: 134 — ABNORMAL LOW
Sodium: 141

## 2011-06-02 LAB — CBC
HCT: 42
Hemoglobin: 14.6
MCHC: 34.8
MCV: 89.7
Platelets: 222
RBC: 4.68
RDW: 12.6
WBC: 14.7 — ABNORMAL HIGH

## 2011-06-02 LAB — D-DIMER, QUANTITATIVE: D-Dimer, Quant: 1.08 — ABNORMAL HIGH

## 2011-12-19 ENCOUNTER — Ambulatory Visit: Payer: Self-pay | Admitting: Gastroenterology

## 2011-12-22 LAB — PATHOLOGY REPORT

## 2012-01-05 ENCOUNTER — Ambulatory Visit: Payer: Self-pay | Admitting: Family Medicine

## 2012-05-10 ENCOUNTER — Emergency Department: Payer: Self-pay | Admitting: *Deleted

## 2012-05-10 LAB — URINALYSIS, COMPLETE
Bilirubin,UR: NEGATIVE
Blood: NEGATIVE
Glucose,UR: NEGATIVE mg/dL (ref 0–75)
Ketone: NEGATIVE
Nitrite: POSITIVE
Ph: 6 (ref 4.5–8.0)
Protein: 30
RBC,UR: 19 /HPF (ref 0–5)
Specific Gravity: 1.028 (ref 1.003–1.030)
Squamous Epithelial: 1
WBC UR: 46 /HPF (ref 0–5)

## 2012-06-17 ENCOUNTER — Emergency Department: Payer: Self-pay | Admitting: Internal Medicine

## 2012-08-02 ENCOUNTER — Emergency Department: Payer: Self-pay | Admitting: Emergency Medicine

## 2012-08-02 LAB — CBC
HCT: 40.9 % (ref 35.0–47.0)
HGB: 14.4 g/dL (ref 12.0–16.0)
MCH: 30.8 pg (ref 26.0–34.0)
MCHC: 35.3 g/dL (ref 32.0–36.0)
MCV: 87 fL (ref 80–100)
Platelet: 141 10*3/uL — ABNORMAL LOW (ref 150–440)
RBC: 4.69 10*6/uL (ref 3.80–5.20)
RDW: 14.2 % (ref 11.5–14.5)
WBC: 8.1 10*3/uL (ref 3.6–11.0)

## 2012-08-02 LAB — COMPREHENSIVE METABOLIC PANEL
Albumin: 4.1 g/dL (ref 3.4–5.0)
Alkaline Phosphatase: 94 U/L (ref 50–136)
Anion Gap: 9 (ref 7–16)
BUN: 9 mg/dL (ref 7–18)
Bilirubin,Total: 0.7 mg/dL (ref 0.2–1.0)
Calcium, Total: 8.9 mg/dL (ref 8.5–10.1)
Chloride: 108 mmol/L — ABNORMAL HIGH (ref 98–107)
Co2: 25 mmol/L (ref 21–32)
Creatinine: 0.57 mg/dL — ABNORMAL LOW (ref 0.60–1.30)
EGFR (African American): >60
EGFR (Non-African Amer.): >60
Glucose: 157 mg/dL — ABNORMAL HIGH (ref 65–99)
Osmolality: 285 (ref 275–301)
Potassium: 3.5 mmol/L (ref 3.5–5.1)
SGOT(AST): 25 U/L (ref 15–37)
SGPT (ALT): 28 U/L (ref 12–78)
Sodium: 142 mmol/L (ref 136–145)
Total Protein: 7.1 g/dL (ref 6.4–8.2)

## 2012-08-03 LAB — URINALYSIS, COMPLETE
Bilirubin,UR: NEGATIVE
Glucose,UR: NEGATIVE mg/dL (ref 0–75)
Ketone: NEGATIVE
Leukocyte Esterase: NEGATIVE
Nitrite: NEGATIVE
Ph: 6 (ref 4.5–8.0)
Protein: 100
RBC,UR: 55 /HPF (ref 0–5)
Squamous Epithelial: 6
WBC UR: 3 /HPF (ref 0–5)

## 2012-08-03 LAB — CK TOTAL AND CKMB (NOT AT ARMC)
CK, Total: 50 U/L (ref 21–215)
CK-MB: <0.5 ng/mL — ABNORMAL LOW (ref 0.5–3.6)

## 2012-08-03 LAB — LIPASE, BLOOD: Lipase: 149 U/L (ref 73–393)

## 2012-08-03 LAB — TROPONIN I: Troponin-I: <0.02 ng/mL

## 2012-08-06 DIAGNOSIS — N3946 Mixed incontinence: Secondary | ICD-10-CM | POA: Insufficient documentation

## 2012-09-15 DIAGNOSIS — Z87442 Personal history of urinary calculi: Secondary | ICD-10-CM | POA: Insufficient documentation

## 2013-02-07 ENCOUNTER — Ambulatory Visit: Payer: Self-pay | Admitting: Urology

## 2013-02-07 DIAGNOSIS — N200 Calculus of kidney: Secondary | ICD-10-CM

## 2013-02-07 DIAGNOSIS — M549 Dorsalgia, unspecified: Secondary | ICD-10-CM | POA: Insufficient documentation

## 2013-02-07 HISTORY — DX: Calculus of kidney: N20.0

## 2013-03-01 ENCOUNTER — Ambulatory Visit: Payer: Self-pay | Admitting: Orthopedic Surgery

## 2013-07-15 ENCOUNTER — Ambulatory Visit: Payer: Self-pay | Admitting: Podiatry

## 2013-08-04 ENCOUNTER — Ambulatory Visit: Payer: Self-pay | Admitting: Podiatry

## 2013-08-04 HISTORY — PX: FLEXOR TENDON REPAIR: SHX1651

## 2013-08-15 ENCOUNTER — Ambulatory Visit: Payer: Self-pay | Admitting: Urology

## 2013-08-25 DIAGNOSIS — A419 Sepsis, unspecified organism: Secondary | ICD-10-CM

## 2013-08-25 HISTORY — PX: KIDNEY STONE SURGERY: SHX686

## 2013-08-25 HISTORY — DX: Sepsis, unspecified organism: A41.9

## 2013-10-16 ENCOUNTER — Emergency Department: Payer: Self-pay | Admitting: Emergency Medicine

## 2013-10-16 LAB — CBC
HCT: 39.2 % (ref 35.0–47.0)
HGB: 13.6 g/dL (ref 12.0–16.0)
MCH: 30.3 pg (ref 26.0–34.0)
MCHC: 34.7 g/dL (ref 32.0–36.0)
MCV: 87 fL (ref 80–100)
Platelet: 128 10*3/uL — ABNORMAL LOW (ref 150–440)
RBC: 4.5 10*6/uL (ref 3.80–5.20)
RDW: 13.8 % (ref 11.5–14.5)
WBC: 5.2 10*3/uL (ref 3.6–11.0)

## 2013-10-16 LAB — COMPREHENSIVE METABOLIC PANEL
Albumin: 4 g/dL (ref 3.4–5.0)
Alkaline Phosphatase: 76 U/L
Anion Gap: 5 — ABNORMAL LOW (ref 7–16)
BUN: 9 mg/dL (ref 7–18)
Bilirubin,Total: 0.6 mg/dL (ref 0.2–1.0)
Calcium, Total: 8.7 mg/dL (ref 8.5–10.1)
Chloride: 103 mmol/L (ref 98–107)
Co2: 30 mmol/L (ref 21–32)
Creatinine: 0.8 mg/dL (ref 0.60–1.30)
EGFR (African American): >60
EGFR (Non-African Amer.): >60
Glucose: 133 mg/dL — ABNORMAL HIGH (ref 65–99)
Osmolality: 276 (ref 275–301)
Potassium: 3.3 mmol/L — ABNORMAL LOW (ref 3.5–5.1)
SGOT(AST): 31 U/L (ref 15–37)
SGPT (ALT): 36 U/L (ref 12–78)
Sodium: 138 mmol/L (ref 136–145)
Total Protein: 7.1 g/dL (ref 6.4–8.2)

## 2014-01-04 DIAGNOSIS — K219 Gastro-esophageal reflux disease without esophagitis: Secondary | ICD-10-CM

## 2014-01-04 DIAGNOSIS — R079 Chest pain, unspecified: Secondary | ICD-10-CM | POA: Insufficient documentation

## 2014-01-04 HISTORY — DX: Gastro-esophageal reflux disease without esophagitis: K21.9

## 2014-04-17 DIAGNOSIS — R000 Tachycardia, unspecified: Secondary | ICD-10-CM

## 2014-04-17 DIAGNOSIS — R531 Weakness: Secondary | ICD-10-CM | POA: Insufficient documentation

## 2014-04-17 HISTORY — DX: Tachycardia, unspecified: R00.0

## 2014-07-05 DIAGNOSIS — R300 Dysuria: Secondary | ICD-10-CM | POA: Insufficient documentation

## 2014-07-05 DIAGNOSIS — R339 Retention of urine, unspecified: Secondary | ICD-10-CM | POA: Insufficient documentation

## 2014-07-05 HISTORY — DX: Retention of urine, unspecified: R33.9

## 2014-07-07 DIAGNOSIS — N3000 Acute cystitis without hematuria: Secondary | ICD-10-CM | POA: Insufficient documentation

## 2014-07-17 ENCOUNTER — Emergency Department: Payer: Self-pay | Admitting: Emergency Medicine

## 2014-07-17 LAB — TSH: Thyroid Stimulating Horm: 1.39 u[IU]/mL

## 2014-07-17 LAB — CBC WITH DIFFERENTIAL/PLATELET
Basophil #: 0 10*3/uL (ref 0.0–0.1)
Basophil %: 0.5 %
Eosinophil #: 0.1 10*3/uL (ref 0.0–0.7)
Eosinophil %: 1.6 %
HCT: 42.5 % (ref 35.0–47.0)
HGB: 14.7 g/dL (ref 12.0–16.0)
Lymphocyte #: 1.4 10*3/uL (ref 1.0–3.6)
Lymphocyte %: 26.3 %
MCH: 30.7 pg (ref 26.0–34.0)
MCHC: 34.5 g/dL (ref 32.0–36.0)
MCV: 89 fL (ref 80–100)
Monocyte #: 0.3 x10 3/mm (ref 0.2–0.9)
Monocyte %: 6.7 %
Neutrophil #: 3.4 10*3/uL (ref 1.4–6.5)
Neutrophil %: 64.9 %
Platelet: 145 10*3/uL — ABNORMAL LOW (ref 150–440)
RBC: 4.79 10*6/uL (ref 3.80–5.20)
RDW: 13.9 % (ref 11.5–14.5)
WBC: 5.2 10*3/uL (ref 3.6–11.0)

## 2014-07-17 LAB — BASIC METABOLIC PANEL
Anion Gap: 7 (ref 7–16)
BUN: 9 mg/dL (ref 7–18)
Calcium, Total: 9.4 mg/dL (ref 8.5–10.1)
Chloride: 104 mmol/L (ref 98–107)
Co2: 28 mmol/L (ref 21–32)
Creatinine: 0.71 mg/dL (ref 0.60–1.30)
EGFR (African American): >60
EGFR (Non-African Amer.): >60
Glucose: 142 mg/dL — ABNORMAL HIGH (ref 65–99)
Osmolality: 279 (ref 275–301)
Potassium: 3.9 mmol/L (ref 3.5–5.1)
Sodium: 139 mmol/L (ref 136–145)

## 2014-07-17 LAB — URINALYSIS, COMPLETE
Bacteria: NONE SEEN
Bilirubin,UR: NEGATIVE
Blood: NEGATIVE
Glucose,UR: NEGATIVE mg/dL (ref 0–75)
Hyaline Cast: 2
Ketone: NEGATIVE
Leukocyte Esterase: NEGATIVE
Nitrite: NEGATIVE
Ph: 7 (ref 4.5–8.0)
Protein: NEGATIVE
RBC,UR: 13 /HPF (ref 0–5)
Specific Gravity: 1.013 (ref 1.003–1.030)
Squamous Epithelial: <1
WBC UR: 1 /HPF (ref 0–5)

## 2014-07-17 LAB — CLOSTRIDIUM DIFFICILE(ARMC)

## 2014-07-17 LAB — TROPONIN I: Troponin-I: <0.02 ng/mL

## 2014-09-18 DIAGNOSIS — R6521 Severe sepsis with septic shock: Secondary | ICD-10-CM

## 2014-09-18 DIAGNOSIS — A419 Sepsis, unspecified organism: Secondary | ICD-10-CM | POA: Insufficient documentation

## 2014-09-20 DIAGNOSIS — D696 Thrombocytopenia, unspecified: Secondary | ICD-10-CM | POA: Insufficient documentation

## 2014-09-20 DIAGNOSIS — J9601 Acute respiratory failure with hypoxia: Secondary | ICD-10-CM

## 2014-09-20 HISTORY — DX: Acute respiratory failure with hypoxia: J96.01

## 2014-09-20 HISTORY — DX: Thrombocytopenia, unspecified: D69.6

## 2014-09-27 DIAGNOSIS — A498 Other bacterial infections of unspecified site: Secondary | ICD-10-CM | POA: Insufficient documentation

## 2015-01-10 DIAGNOSIS — I10 Essential (primary) hypertension: Secondary | ICD-10-CM

## 2015-01-10 HISTORY — DX: Essential (primary) hypertension: I10

## 2015-02-28 ENCOUNTER — Other Ambulatory Visit: Payer: Self-pay | Admitting: Family Medicine

## 2015-02-28 DIAGNOSIS — Z9882 Breast implant status: Secondary | ICD-10-CM

## 2015-02-28 DIAGNOSIS — Z1231 Encounter for screening mammogram for malignant neoplasm of breast: Secondary | ICD-10-CM

## 2015-03-02 ENCOUNTER — Other Ambulatory Visit: Payer: Self-pay | Admitting: Family Medicine

## 2015-03-02 ENCOUNTER — Ambulatory Visit
Admission: RE | Admit: 2015-03-02 | Discharge: 2015-03-02 | Disposition: A | Discharge status: Home / Self Care | Payer: 59 | Source: Ambulatory Visit | Attending: Family Medicine | Admitting: Family Medicine

## 2015-03-02 DIAGNOSIS — Z9882 Breast implant status: Secondary | ICD-10-CM | POA: Diagnosis not present

## 2015-03-02 DIAGNOSIS — Z1231 Encounter for screening mammogram for malignant neoplasm of breast: Secondary | ICD-10-CM | POA: Insufficient documentation

## 2015-03-26 ENCOUNTER — Ambulatory Visit
Admission: RE | Admit: 2015-03-26 | Discharge: 2015-03-26 | Disposition: A | Discharge status: Home / Self Care | Payer: 59 | Source: Ambulatory Visit | Attending: Chiropractor | Admitting: Chiropractor

## 2015-03-26 ENCOUNTER — Other Ambulatory Visit: Payer: Self-pay | Admitting: Chiropractor

## 2015-03-26 DIAGNOSIS — M25512 Pain in left shoulder: Secondary | ICD-10-CM | POA: Diagnosis not present

## 2015-03-26 DIAGNOSIS — M25511 Pain in right shoulder: Secondary | ICD-10-CM

## 2015-03-26 DIAGNOSIS — I10 Essential (primary) hypertension: Secondary | ICD-10-CM | POA: Insufficient documentation

## 2015-04-28 DIAGNOSIS — J189 Pneumonia, unspecified organism: Secondary | ICD-10-CM | POA: Insufficient documentation

## 2015-05-16 ENCOUNTER — Other Ambulatory Visit: Payer: Self-pay | Admitting: Family Medicine

## 2015-05-16 ENCOUNTER — Ambulatory Visit
Admission: RE | Admit: 2015-05-16 | Discharge: 2015-05-16 | Disposition: A | Discharge status: Home / Self Care | Payer: 59 | Source: Ambulatory Visit | Attending: Family Medicine | Admitting: Family Medicine

## 2015-05-16 DIAGNOSIS — Z8701 Personal history of pneumonia (recurrent): Secondary | ICD-10-CM | POA: Insufficient documentation

## 2015-05-16 DIAGNOSIS — J189 Pneumonia, unspecified organism: Secondary | ICD-10-CM

## 2015-05-16 DIAGNOSIS — Z09 Encounter for follow-up examination after completed treatment for conditions other than malignant neoplasm: Secondary | ICD-10-CM | POA: Diagnosis not present

## 2015-07-12 DIAGNOSIS — N3020 Other chronic cystitis without hematuria: Secondary | ICD-10-CM | POA: Insufficient documentation

## 2015-10-25 ENCOUNTER — Ambulatory Visit
Admission: RE | Admit: 2015-10-25 | Discharge: 2015-10-25 | Disposition: A | Discharge status: Home / Self Care | Payer: 59 | Source: Ambulatory Visit | Attending: Family Medicine | Admitting: Family Medicine

## 2015-10-25 ENCOUNTER — Other Ambulatory Visit: Payer: Self-pay | Admitting: Family Medicine

## 2015-10-25 DIAGNOSIS — R05 Cough: Secondary | ICD-10-CM

## 2015-10-25 DIAGNOSIS — R059 Cough, unspecified: Secondary | ICD-10-CM

## 2016-01-15 DIAGNOSIS — K591 Functional diarrhea: Secondary | ICD-10-CM | POA: Insufficient documentation

## 2016-01-15 DIAGNOSIS — R1011 Right upper quadrant pain: Secondary | ICD-10-CM | POA: Insufficient documentation

## 2016-02-13 ENCOUNTER — Other Ambulatory Visit: Payer: Self-pay

## 2016-02-13 DIAGNOSIS — D649 Anemia, unspecified: Secondary | ICD-10-CM | POA: Insufficient documentation

## 2016-02-13 DIAGNOSIS — I493 Ventricular premature depolarization: Secondary | ICD-10-CM | POA: Insufficient documentation

## 2016-02-13 DIAGNOSIS — I341 Nonrheumatic mitral (valve) prolapse: Secondary | ICD-10-CM | POA: Insufficient documentation

## 2016-02-13 DIAGNOSIS — F419 Anxiety disorder, unspecified: Secondary | ICD-10-CM | POA: Insufficient documentation

## 2016-02-14 ENCOUNTER — Ambulatory Visit (INDEPENDENT_AMBULATORY_CARE_PROVIDER_SITE_OTHER): Payer: 59 | Admitting: Gastroenterology

## 2016-02-14 ENCOUNTER — Encounter: Payer: Self-pay | Admitting: Gastroenterology

## 2016-02-14 ENCOUNTER — Other Ambulatory Visit: Payer: Self-pay

## 2016-02-14 VITALS — BP 148/86 | HR 86 | Temp 97.0°F | Ht 71.0 in | Wt 226.0 lb

## 2016-02-14 DIAGNOSIS — R197 Diarrhea, unspecified: Secondary | ICD-10-CM | POA: Diagnosis not present

## 2016-02-14 DIAGNOSIS — R112 Nausea with vomiting, unspecified: Secondary | ICD-10-CM

## 2016-02-14 DIAGNOSIS — R1011 Right upper quadrant pain: Secondary | ICD-10-CM

## 2016-02-14 NOTE — Progress Notes (Signed)
Gastroenterology Consultation  Referring Provider:     Lynnell Jude, MD Primary Care Physician:  Lynnell Jude, MD Primary Gastroenterologist:  Dr. Allen Norris     Reason for Consultation:     Multiple GI symptoms        HPI:   Grace Hester is a 55 y.o. y/o female referred for consultation & management of Multiple GI symptoms by Dr. Clemmie Krill, Lynnell Jude, MD.  This patient comes in today with a history of nausea and vomiting with fatty foods or greasy foods. The patient also reports that she had an ultrasound that showed an enlarged liver. The patient has had diarrhea for the last 6 months. She denies any unexplained weight loss. The patient's history includes lithotripsy about a year ago which resulted in sepsis and admission to the ICU. The patient states that she now has a watery bowel movement every one to 1-1/2 hours. She also reports that she will wake up a few times during the night to have a bowel movement. She will sometimes lose some liquid when she passes gas. The patient also has abdominal pain in the right side of her abdomen. The patient states she had an ultrasound that showed an enlarged liver. This pain is made better by her gabapentin which she takes for her leg. There is no report of any black stools or bloody stools. She also denies any fevers or chills.  Past Medical History  Diagnosis Date  . Abdominal pain   . Anemia   . Anxiety   . Hypertension   . Kidney stones   . GERD (gastroesophageal reflux disease)   . Billowing mitral valve   . PVC (premature ventricular contraction)   . Depression     Past Surgical History  Procedure Laterality Date  . Augmentation mammaplasty  2008    Prior to Admission medications   Medication Sig Start Date End Date Taking? Authorizing Provider  ARIPiprazole (ABILIFY) 2 MG tablet Take 2 mg by mouth daily.  06/18/15  Yes Historical Provider, MD  esomeprazole (NEXIUM) 20 MG capsule Take 20 mg by mouth daily at 12 noon.    Yes  Historical Provider, MD  fexofenadine (ALLEGRA) 180 MG tablet Take 180 mg by mouth daily.    Yes Historical Provider, MD  gabapentin (NEURONTIN) 300 MG capsule Take 300 mg by mouth 3 (three) times daily.  10/30/14  Yes Historical Provider, MD  hydrochlorothiazide (HYDRODIURIL) 12.5 MG tablet Take 12.5 mg by mouth 2 (two) times daily.  04/08/15  Yes Historical Provider, MD  metoprolol succinate (TOPROL-XL) 50 MG 24 hr tablet Take 50 mg by mouth daily.  12/29/14  Yes Historical Provider, MD  sertraline (ZOLOFT) 100 MG tablet Take 100 mg by mouth at bedtime.  10/30/14  Yes Historical Provider, MD    Family History  Problem Relation Age of Onset  . Heart disease Mother   . Stroke Mother   . Heart disease Father      Social History  Substance Use Topics  . Smoking status: Former Smoker    Quit date: 01/27/2000  . Smokeless tobacco: Never Used  . Alcohol Use: No    Allergies as of 02/14/2016 - Review Complete 02/14/2016  Allergen Reaction Noted  . Latex Other (See Comments) 02/13/2016  . Tape  02/13/2016    Review of Systems:    All systems reviewed and negative except where noted in HPI.   Physical Exam:  BP 148/86 mmHg  Pulse 86  Temp(Src) 97  F (36.1 C) (Oral)  Ht 5\' 11"  (1.803 m)  Wt 226 lb (102.513 kg)  BMI 31.53 kg/m2 No LMP recorded. Patient has had a hysterectomy. Psych:  Alert and cooperative. Normal mood and affect. General:   Alert,  Well-developed, well-nourished, pleasant and cooperative in NAD Head:  Normocephalic and atraumatic. Eyes:  Sclera clear, no icterus.   Conjunctiva pink. Ears:  Normal auditory acuity. Nose:  No deformity, discharge, or lesions. Mouth:  No deformity or lesions,oropharynx pink & moist. Neck:  Supple; no masses or thyromegaly. Lungs:  Respirations even and unlabored.  Clear throughout to auscultation.   No wheezes, crackles, or rhonchi. No acute distress. Heart:  Regular rate and rhythm; no murmurs, clicks, rubs, or gallops. Abdomen:   Normal bowel sounds.  No bruits.  Soft, Positive tenderness with 1 finger palpation while raising the legs 6 inches above the exam table, nd non-distended without masses, hepatosplenomegaly or hernias noted.  No guarding or rebound tenderness.  Positive Carnett sign.   Rectal:  Deferred.  Msk:  Symmetrical without gross deformities.  Good, equal movement & strength bilaterally. Pulses:  Normal pulses noted. Extremities:  No clubbing or edema.  No cyanosis. Neurologic:  Alert and oriented x3;  grossly normal neurologically. Skin:  Intact without significant lesions or rashes.  No jaundice. Lymph Nodes:  No significant cervical adenopathy. Psych:  Alert and cooperative. Normal mood and affect.  Imaging Studies: No results found.  Assessment and Plan:   [name] is a 55 y.o. y/o female who comes in today with abdominal pain that is clearly musculoskeletal and reproducible with 1 finger palpation with raising the legs of the patient 6 inches above the exam table. The patient's pain is exacerbated with just the straight leg lift without even palpating the abdominal wall. The patient does have nausea and vomiting to fatty foods and has had 6 months of diarrhea. For this the patient will be set up for an EGD and colonoscopy. The patient will also have a gallbladder emptying study to see if her gallbladder is doing well. She will also have her blood sent off for possible causes of abnormal liver ultrasound.   Note: This dictation was prepared with Dragon dictation along with smaller phrase technology. Any transcriptional errors that result from this process are unintentional.

## 2016-02-15 ENCOUNTER — Other Ambulatory Visit: Payer: Self-pay

## 2016-02-15 LAB — HEPATIC FUNCTION PANEL
ALT: 29 IU/L (ref 0–32)
AST: 27 IU/L (ref 0–40)
Albumin: 4.7 g/dL (ref 3.5–5.5)
Alkaline Phosphatase: 78 IU/L (ref 39–117)
BILIRUBIN TOTAL: 0.5 mg/dL (ref 0.0–1.2)
Bilirubin, Direct: 0.14 mg/dL (ref 0.00–0.40)
Total Protein: 6.6 g/dL (ref 6.0–8.5)

## 2016-02-15 LAB — MITOCHONDRIAL ANTIBODIES: Mitochondrial Ab: 5.6 Units (ref 0.0–20.0)

## 2016-02-15 LAB — HEPATITIS A ANTIBODY, TOTAL: HEP A TOTAL AB: POSITIVE — AB

## 2016-02-15 LAB — ANTI-SMOOTH MUSCLE ANTIBODY, IGG: SMOOTH MUSCLE AB: 13 U (ref 0–19)

## 2016-02-15 LAB — ALPHA-1-ANTITRYPSIN: A1 ANTITRYPSIN: 141 mg/dL (ref 90–200)

## 2016-02-15 LAB — HEPATITIS B SURFACE ANTIBODY,QUALITATIVE: HEP B SURFACE AB, QUAL: NONREACTIVE

## 2016-02-15 LAB — HEPATITIS B SURFACE ANTIGEN: HEP B S AG: NEGATIVE

## 2016-02-15 LAB — HEPATITIS C ANTIBODY

## 2016-02-15 LAB — ANA: Anti Nuclear Antibody(ANA): NEGATIVE

## 2016-02-15 LAB — CERULOPLASMIN: CERULOPLASMIN: 40.6 mg/dL — AB (ref 19.0–39.0)

## 2016-02-20 ENCOUNTER — Telehealth: Payer: Self-pay

## 2016-02-20 NOTE — Telephone Encounter (Signed)
-----   Message from Lucilla Lame, MD sent at 02/18/2016 12:43 PM EDT ----- Let the patient know that her liver enzymes were normal as was all the other blood work for her liver disease.

## 2016-02-20 NOTE — Telephone Encounter (Signed)
LVM with lab results. Advised pt to return my call with any questions.

## 2016-02-27 ENCOUNTER — Ambulatory Visit: Payer: 59 | Admitting: Gastroenterology

## 2016-03-01 IMAGING — CR DG SHOULDER 2+V*L*
1 series · 3 of 3 positions shown · non-contrast
Comparison: Chest x-ray dated October 16, 2013 which revealed
portions of the shoulders

CLINICAL DATA: Bilateral shoulder pain numbness and weakness with
week grip and limited range of motion

EXAM:
LEFT SHOULDER - 2+ VIEW; RIGHT SHOULDER - 2+ VIEW

[Series 1: dg shoulder left · 0.14mm/px · 3 of 3 slices shown]
[im 1/3]
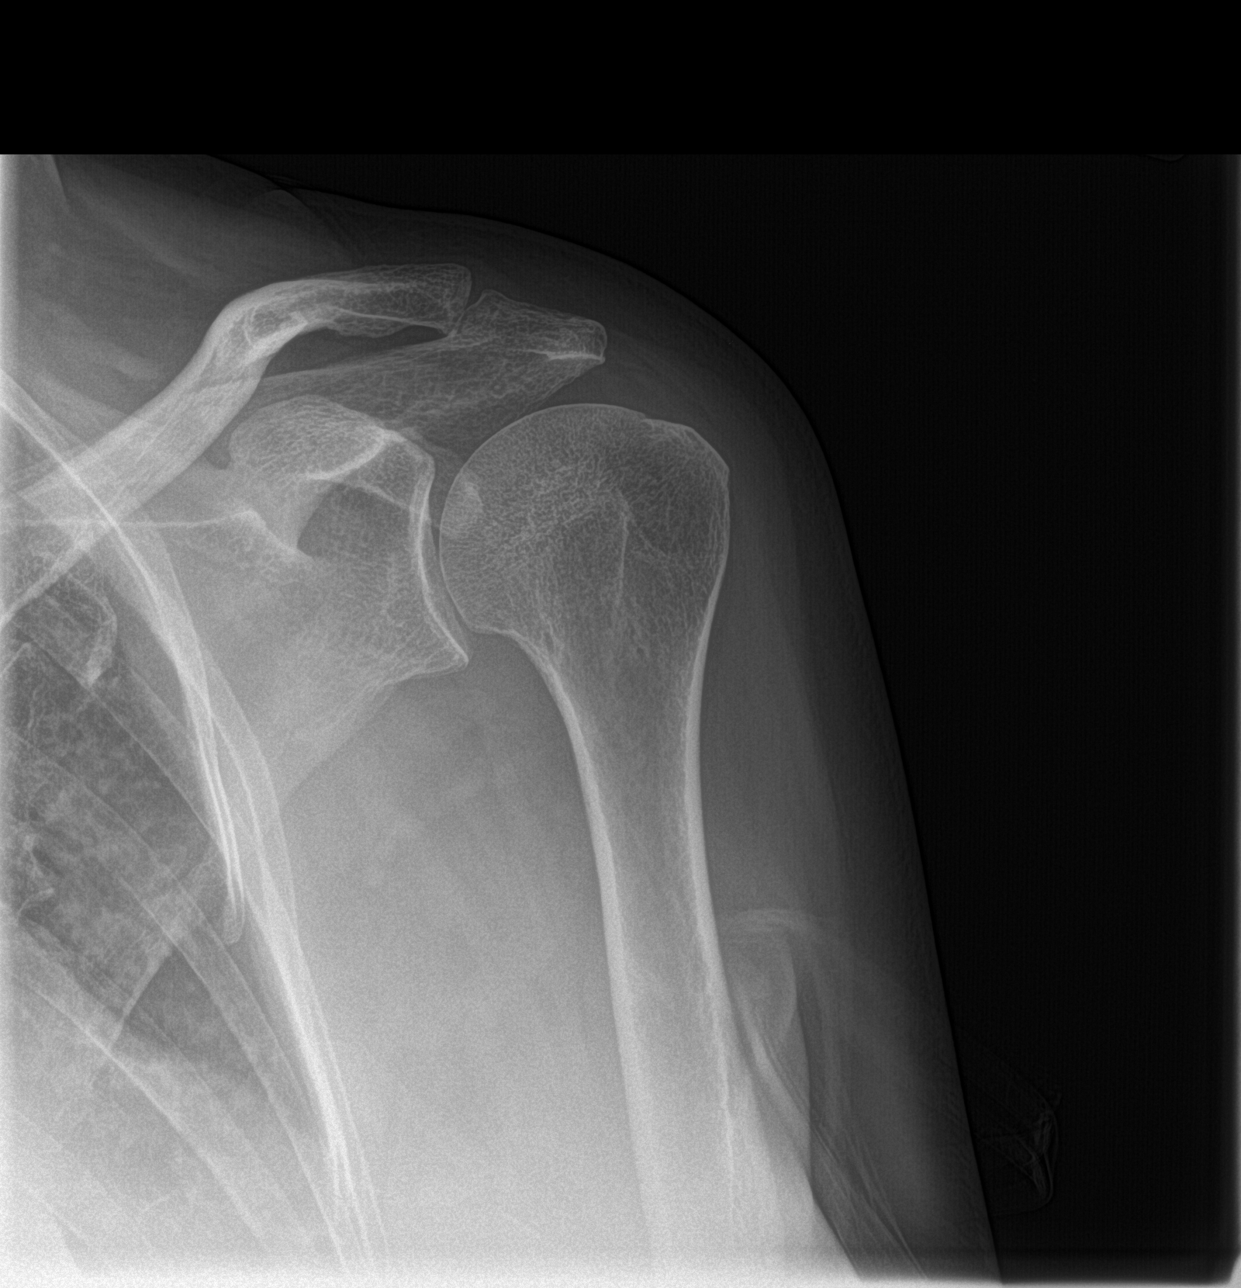
[im 2/3]
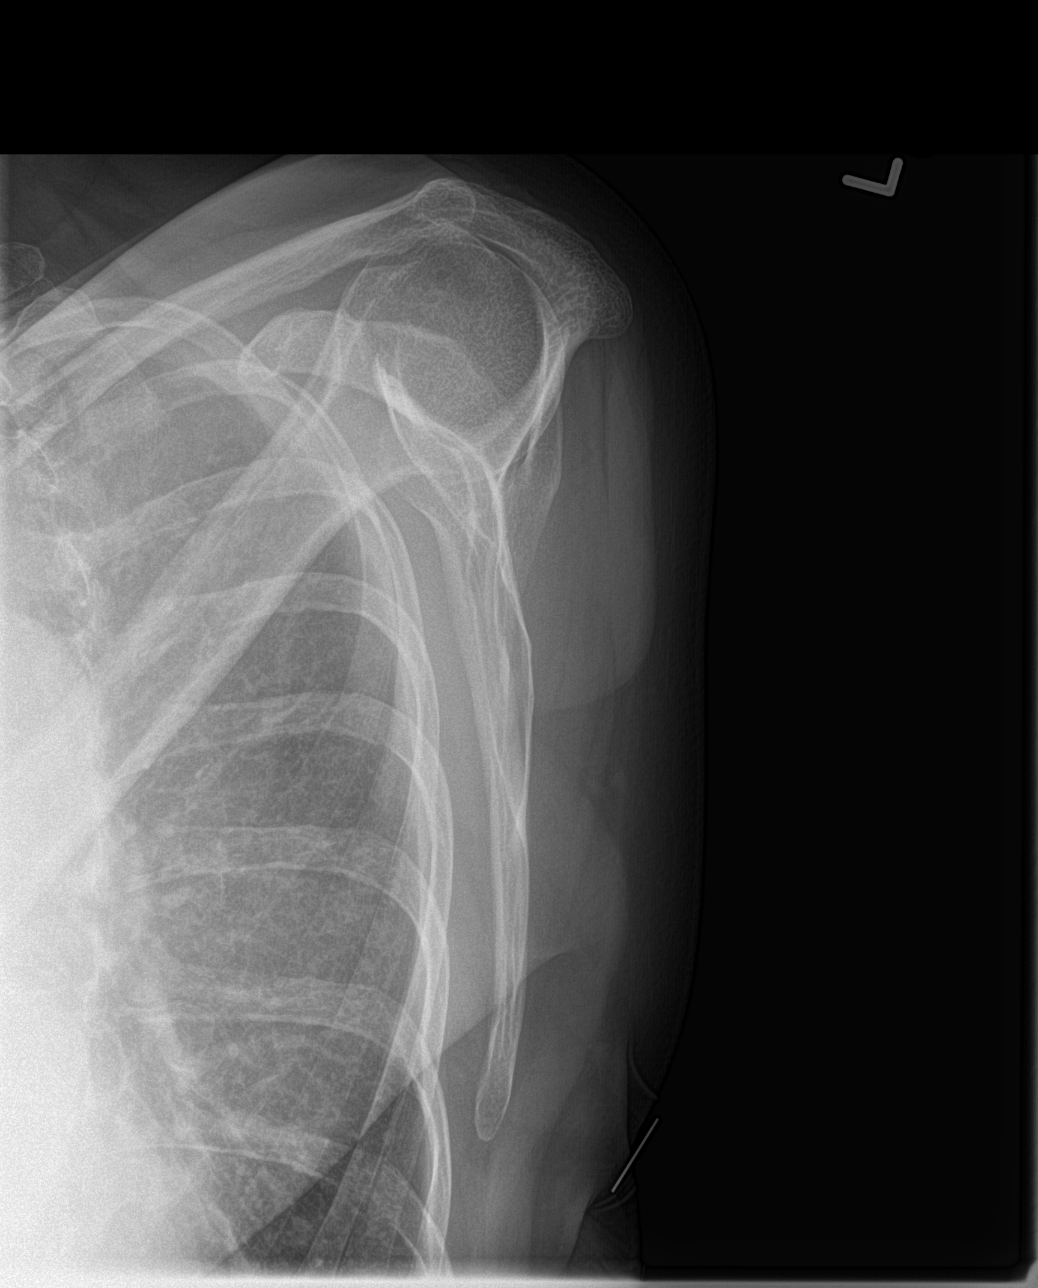
[im 3/3]
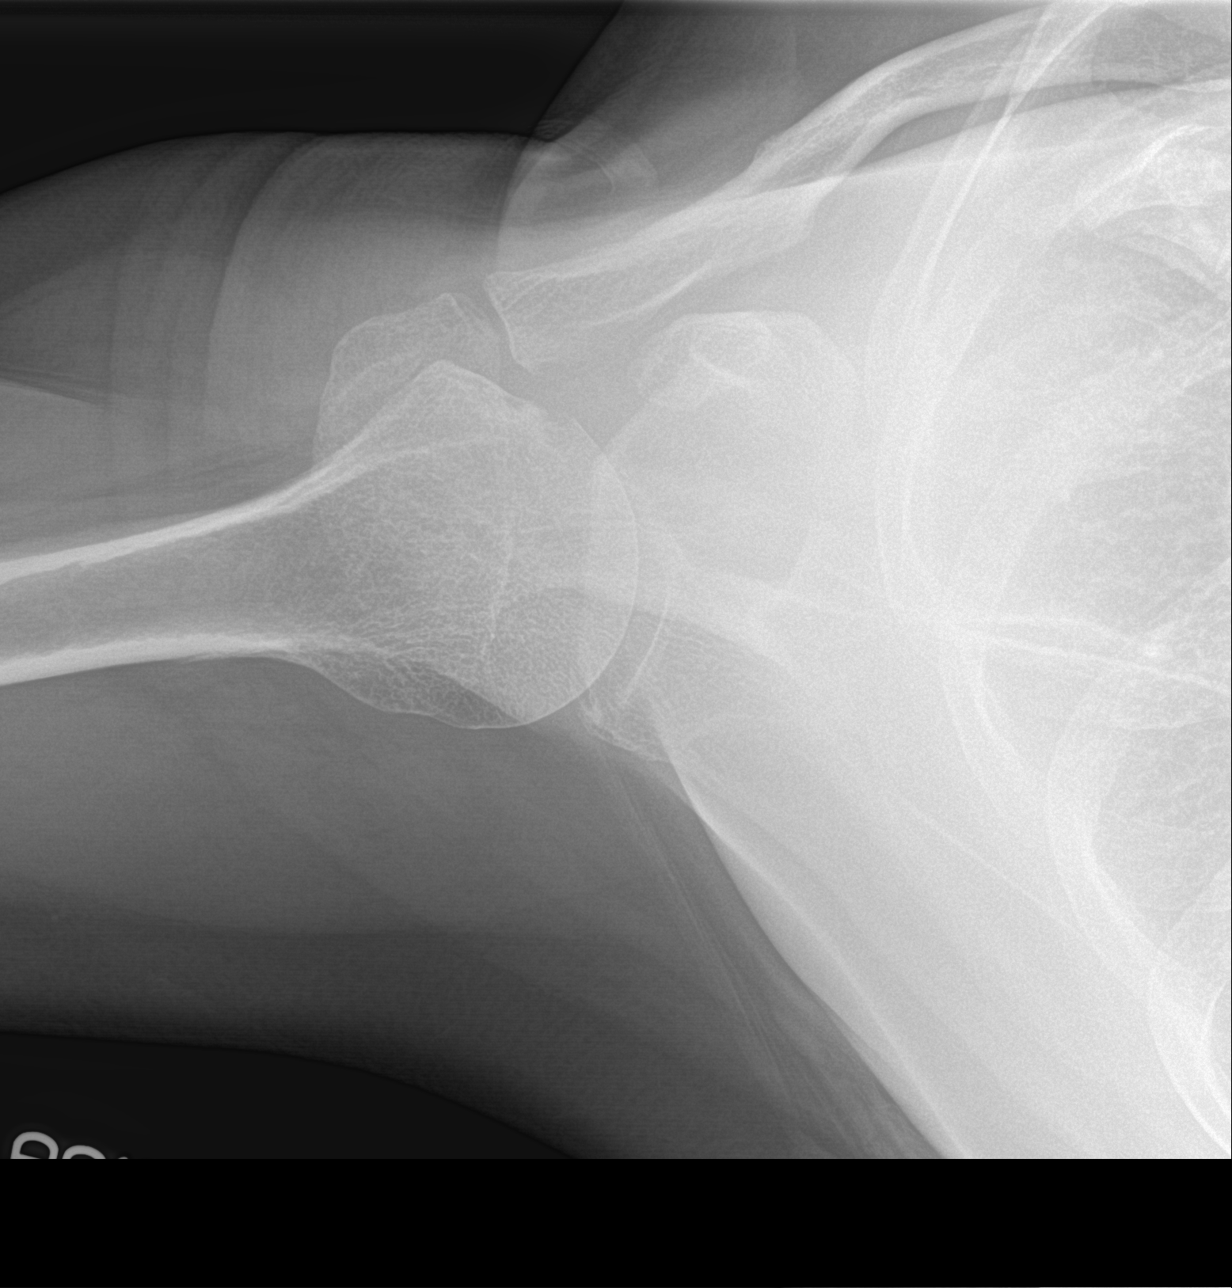

[3 of 3 positions shown; findings below may reference images not displayed]

FINDINGS: Right shoulder: The bones are adequately mineralized. The joint
spaces are preserved. The subacromial subdeltoid space is normal.
The observed portions of the right clavicle and upper right ribs are
normal. No scapular abnormality is observed.

Left shoulder: The bones are adequately mineralized. There is no
fracture nor dislocation. The joint spaces are reasonably well
maintained subacromial subdeltoid space is normal. The observed
portions of the left clavicle and upper left ribs are normal. The
scapula is unremarkable.
IMPRESSION: There is no acute or significant chronic bony abnormality of the
right shoulder.

## 2016-03-07 ENCOUNTER — Encounter: Payer: Self-pay | Admitting: *Deleted

## 2016-03-13 NOTE — Discharge Instructions (Signed)

## 2016-03-14 ENCOUNTER — Ambulatory Visit
Admission: RE | Admit: 2016-03-14 | Discharge: 2016-03-14 | Disposition: A | Discharge status: Home / Self Care | Payer: 59 | Source: Ambulatory Visit | Attending: Gastroenterology | Admitting: Gastroenterology

## 2016-03-14 ENCOUNTER — Encounter
Admission: RE | Disposition: A | Discharge status: Home / Self Care | Payer: Self-pay | Source: Ambulatory Visit | Attending: Gastroenterology

## 2016-03-14 ENCOUNTER — Ambulatory Visit: Payer: 59 | Admitting: Anesthesiology

## 2016-03-14 DIAGNOSIS — Z87442 Personal history of urinary calculi: Secondary | ICD-10-CM | POA: Insufficient documentation

## 2016-03-14 DIAGNOSIS — F329 Major depressive disorder, single episode, unspecified: Secondary | ICD-10-CM | POA: Insufficient documentation

## 2016-03-14 DIAGNOSIS — K5730 Diverticulosis of large intestine without perforation or abscess without bleeding: Secondary | ICD-10-CM | POA: Diagnosis not present

## 2016-03-14 DIAGNOSIS — K3189 Other diseases of stomach and duodenum: Secondary | ICD-10-CM | POA: Diagnosis not present

## 2016-03-14 DIAGNOSIS — R197 Diarrhea, unspecified: Secondary | ICD-10-CM | POA: Insufficient documentation

## 2016-03-14 DIAGNOSIS — Z87891 Personal history of nicotine dependence: Secondary | ICD-10-CM | POA: Insufficient documentation

## 2016-03-14 DIAGNOSIS — D125 Benign neoplasm of sigmoid colon: Secondary | ICD-10-CM | POA: Insufficient documentation

## 2016-03-14 DIAGNOSIS — K219 Gastro-esophageal reflux disease without esophagitis: Secondary | ICD-10-CM | POA: Insufficient documentation

## 2016-03-14 DIAGNOSIS — K641 Second degree hemorrhoids: Secondary | ICD-10-CM | POA: Insufficient documentation

## 2016-03-14 DIAGNOSIS — K2950 Unspecified chronic gastritis without bleeding: Secondary | ICD-10-CM | POA: Insufficient documentation

## 2016-03-14 DIAGNOSIS — K2970 Gastritis, unspecified, without bleeding: Secondary | ICD-10-CM | POA: Diagnosis not present

## 2016-03-14 DIAGNOSIS — I10 Essential (primary) hypertension: Secondary | ICD-10-CM | POA: Diagnosis not present

## 2016-03-14 DIAGNOSIS — F419 Anxiety disorder, unspecified: Secondary | ICD-10-CM | POA: Diagnosis not present

## 2016-03-14 DIAGNOSIS — Z862 Personal history of diseases of the blood and blood-forming organs and certain disorders involving the immune mechanism: Secondary | ICD-10-CM | POA: Insufficient documentation

## 2016-03-14 DIAGNOSIS — G709 Myoneural disorder, unspecified: Secondary | ICD-10-CM | POA: Diagnosis not present

## 2016-03-14 DIAGNOSIS — K529 Noninfective gastroenteritis and colitis, unspecified: Secondary | ICD-10-CM | POA: Diagnosis present

## 2016-03-14 DIAGNOSIS — Z91040 Latex allergy status: Secondary | ICD-10-CM | POA: Diagnosis not present

## 2016-03-14 HISTORY — DX: Severe sepsis with septic shock: R65.21

## 2016-03-14 HISTORY — PX: COLONOSCOPY WITH PROPOFOL: SHX5780

## 2016-03-14 HISTORY — DX: Adverse effect of unspecified anesthetic, initial encounter: T41.45XA

## 2016-03-14 HISTORY — DX: Sepsis, unspecified organism: A41.9

## 2016-03-14 HISTORY — PX: POLYPECTOMY: SHX5525

## 2016-03-14 HISTORY — PX: ESOPHAGOGASTRODUODENOSCOPY (EGD) WITH PROPOFOL: SHX5813

## 2016-03-14 HISTORY — DX: Sciatica, left side: M54.32

## 2016-03-14 HISTORY — DX: Other complications of anesthesia, initial encounter: T88.59XA

## 2016-03-14 SURGERY — COLONOSCOPY WITH PROPOFOL
Anesthesia: Monitor Anesthesia Care | Wound class: Contaminated

## 2016-03-14 MED ORDER — LACTATED RINGERS IV SOLN
INTRAVENOUS | Status: DC
  Administered 2016-03-14: 09:00:00 via INTRAVENOUS

## 2016-03-14 MED ORDER — GLYCOPYRROLATE 0.2 MG/ML IJ SOLN
INTRAMUSCULAR | Status: DC | PRN
  Administered 2016-03-14: 0.2 mg via INTRAVENOUS

## 2016-03-14 MED ORDER — ACETAMINOPHEN 325 MG PO TABS: 325.0000 mg | ORAL_TABLET | ORAL | Status: DC | PRN

## 2016-03-14 MED ORDER — ACETAMINOPHEN 160 MG/5ML PO SOLN: 325.0000 mg | ORAL | Status: DC | PRN

## 2016-03-14 MED ORDER — ONDANSETRON HCL 4 MG/2ML IJ SOLN: 4.0000 mg | Freq: Once | INTRAMUSCULAR | Status: DC | PRN

## 2016-03-14 MED ORDER — PROPOFOL 10 MG/ML IV BOLUS
INTRAVENOUS | Status: DC | PRN
  Administered 2016-03-14 (×2): 30 mg via INTRAVENOUS
  Administered 2016-03-14: 20 mg via INTRAVENOUS
  Administered 2016-03-14: 40 mg via INTRAVENOUS
  Administered 2016-03-14 (×3): 30 mg via INTRAVENOUS
  Administered 2016-03-14 (×3): 20 mg via INTRAVENOUS
  Administered 2016-03-14: 30 mg via INTRAVENOUS
  Administered 2016-03-14: 20 mg via INTRAVENOUS
  Administered 2016-03-14: 70 mg via INTRAVENOUS
  Administered 2016-03-14: 30 mg via INTRAVENOUS
  Administered 2016-03-14: 10 mg via INTRAVENOUS
  Administered 2016-03-14: 20 mg via INTRAVENOUS
  Administered 2016-03-14: 30 mg via INTRAVENOUS
  Administered 2016-03-14 (×2): 40 mg via INTRAVENOUS
  Administered 2016-03-14: 20 mg via INTRAVENOUS
  Administered 2016-03-14 (×2): 30 mg via INTRAVENOUS
  Administered 2016-03-14: 10 mg via INTRAVENOUS
  Administered 2016-03-14: 20 mg via INTRAVENOUS
  Administered 2016-03-14: 30 mg via INTRAVENOUS

## 2016-03-14 MED ORDER — STERILE WATER FOR IRRIGATION IR SOLN
Status: DC | PRN
  Administered 2016-03-14: 11:00:00

## 2016-03-14 MED ORDER — LIDOCAINE HCL (CARDIAC) 20 MG/ML IV SOLN
INTRAVENOUS | Status: DC | PRN
  Administered 2016-03-14: 50 mg via INTRAVENOUS

## 2016-03-14 SURGICAL SUPPLY — 35 items
BALLN DILATOR 10-12 8 (BALLOONS)
BALLN DILATOR 12-15 8 (BALLOONS)
BALLN DILATOR 15-18 8 (BALLOONS)
BALLN DILATOR CRE 0-12 8 (BALLOONS)
BALLN DILATOR ESOPH 8 10 CRE (MISCELLANEOUS) IMPLANT
BALLOON DILATOR 12-15 8 (BALLOONS) IMPLANT
BALLOON DILATOR 15-18 8 (BALLOONS) IMPLANT
BALLOON DILATOR CRE 0-12 8 (BALLOONS) IMPLANT
BLOCK BITE 60FR ADLT L/F GRN (MISCELLANEOUS) ×4 IMPLANT
CANISTER SUCT 1200ML W/VALVE (MISCELLANEOUS) ×4 IMPLANT
CLIP HMST 235XBRD CATH ROT (MISCELLANEOUS) IMPLANT
CLIP RESOLUTION 360 11X235 (MISCELLANEOUS)
FCP ESCP3.2XJMB 240X2.8X (MISCELLANEOUS)
FORCEPS BIOP RAD 4 LRG CAP 4 (CUTTING FORCEPS) ×2 IMPLANT
FORCEPS BIOP RJ4 240 W/NDL (MISCELLANEOUS)
FORCEPS ESCP3.2XJMB 240X2.8X (MISCELLANEOUS) IMPLANT
GOWN CVR UNV OPN BCK APRN NK (MISCELLANEOUS) ×4 IMPLANT
GOWN ISOL THUMB LOOP REG UNIV (MISCELLANEOUS) ×8
INJECTOR VARIJECT VIN23 (MISCELLANEOUS) IMPLANT
KIT DEFENDO VALVE AND CONN (KITS) IMPLANT
KIT ENDO PROCEDURE OLY (KITS) ×4 IMPLANT
MARKER SPOT ENDO TATTOO 5ML (MISCELLANEOUS) IMPLANT
PAD GROUND ADULT SPLIT (MISCELLANEOUS) ×2 IMPLANT
PROBE APC STR FIRE (PROBE) IMPLANT
RETRIEVER NET PLAT FOOD (MISCELLANEOUS) IMPLANT
RETRIEVER NET ROTH 2.5X230 LF (MISCELLANEOUS) ×4 IMPLANT
SNARE SHORT THROW 13M SML OVAL (MISCELLANEOUS) ×2 IMPLANT
SNARE SHORT THROW 30M LRG OVAL (MISCELLANEOUS) IMPLANT
SNARE SNG USE RND 15MM (INSTRUMENTS) IMPLANT
SPOT EX ENDOSCOPIC TATTOO (MISCELLANEOUS)
SYR INFLATION 60ML (SYRINGE) IMPLANT
TRAP ETRAP POLY (MISCELLANEOUS) ×2 IMPLANT
VARIJECT INJECTOR VIN23 (MISCELLANEOUS)
WATER STERILE IRR 250ML POUR (IV SOLUTION) ×4 IMPLANT
WIRE CRE 18-20MM 8CM F G (MISCELLANEOUS) IMPLANT

## 2016-03-14 NOTE — Op Note (Signed)
[facility] Gastroenterology Patient Name: Grace Hester Procedure Date: 03/14/2016 10:26 AM MRN: [id number] Account #: [id number] Date of Birth: Jul 05, 1961 Admit Type: Outpatient Age: 55 Room: [facility] OR ROOM 01 Gender: Female Note Status: Finalized Procedure:            Colonoscopy Indications:          Chronic diarrhea Providers:            Lucilla Lame MD, MD Referring MD:         Lynnell Jude (Referring MD) Medicines:            Propofol per Anesthesia Complications:        No immediate complications. Procedure:            Pre-Anesthesia Assessment:                       - Prior to the procedure, a History and Physical was                        performed, and patient medications and allergies were                        reviewed. The patient's tolerance of previous                        anesthesia was also reviewed. The risks and benefits of                        the procedure and the sedation options and risks were                        discussed with the patient. All questions were                        answered, and informed consent was obtained. Prior                        Anticoagulants: The patient has taken no previous                        anticoagulant or antiplatelet agents. ASA Grade                        Assessment: II - A patient with mild systemic disease.                        After reviewing the risks and benefits, the patient was                        deemed in satisfactory condition to undergo the                        procedure.                       After obtaining informed consent, the colonoscope was                        passed under direct vision. Throughout the procedure,  the patient's blood pressure, pulse, and oxygen                        saturations were monitored continuously. The Olympus CF                        H180AL colonoscope (S#: [id number]) was introduced through   the anus and advanced to the the terminal ileum. The                        colonoscopy was performed without difficulty. The                        patient tolerated the procedure well. The quality of                        the bowel preparation was excellent. Findings:      The perianal and digital rectal examinations were normal.      A 7 mm polyp was found in the sigmoid colon. The polyp was sessile. The       polyp was removed with a hot snare. Resection and retrieval were       complete.      A 3 mm polyp was found in the sigmoid colon. The polyp was sessile. The       polyp was removed with a cold biopsy forceps. Resection and retrieval       were complete.      Multiple small-mouthed diverticula were found in the sigmoid colon.      Non-bleeding internal hemorrhoids were found during retroflexion. The       hemorrhoids were Grade II (internal hemorrhoids that prolapse but reduce       spontaneously). Impression:           - One 7 mm polyp in the sigmoid colon, removed with a                        hot snare. Resected and retrieved.                       - One 3 mm polyp in the sigmoid colon, removed with a                        cold biopsy forceps. Resected and retrieved.                       - Diverticulosis in the sigmoid colon.                       - Non-bleeding internal hemorrhoids. Recommendation:       - Await pathology results. Procedure Code(s):    --- Professional ---                       [phone], Colonoscopy, flexible; with removal of tumor(s),                        polyp(s), or other lesion(s) by snare technique                       [id number], 59, Colonoscopy, flexible; with biopsy, single  or multiple Diagnosis Code(s):    --- Professional ---                       K52.9, Noninfective gastroenteritis and colitis,                        unspecified                       D12.5, Benign neoplasm of sigmoid colon CPT copyright 2016 American  Medical Association. All rights reserved. The codes documented in this report are preliminary and upon coder review may  be revised to meet current compliance requirements. Lucilla Lame MD, MD 03/14/2016 11:08:15 AM This report has been signed electronically. Number of Addenda: 0 Note Initiated On: 03/14/2016 10:26 AM Scope Withdrawal Time: 0 hours 13 minutes 7 seconds  Total Procedure Duration: 0 hours 19 minutes 57 seconds       [facility]

## 2016-03-14 NOTE — Anesthesia Preprocedure Evaluation (Signed)
Anesthesia Evaluation  Patient identified by MRN, date of birth, ID band  Reviewed: Allergy & Precautions, NPO status , Patient's Chart, lab work & pertinent test results  Airway Mallampati: II  TM Distance: >3 FB Neck ROM: Full    Dental no notable dental hx.    Pulmonary former smoker,    Pulmonary exam normal        Cardiovascular hypertension, Pt. on home beta blockers and Pt. on medications Normal cardiovascular exam     Neuro/Psych PSYCHIATRIC DISORDERS Anxiety Depression  Neuromuscular disease    GI/Hepatic GERD  Medicated and Controlled,  Endo/Other  negative endocrine ROS  Renal/GU negative Renal ROS     Musculoskeletal negative musculoskeletal ROS (+)   Abdominal   Peds  Hematology negative hematology ROS (+)   Anesthesia Other Findings   Reproductive/Obstetrics                             Anesthesia Physical Anesthesia Plan  ASA: II  Anesthesia Plan: MAC   Post-op Pain Management:    Induction: Intravenous  Airway Management Planned:   Additional Equipment:   Intra-op Plan:   Post-operative Plan:   Informed Consent: I have reviewed the patients History and Physical, chart, labs and discussed the procedure including the risks, benefits and alternatives for the proposed anesthesia with the patient or authorized representative who has indicated his/her understanding and acceptance.     Plan Discussed with: CRNA  Anesthesia Plan Comments:         Anesthesia Quick Evaluation

## 2016-03-14 NOTE — H&P (Signed)
Grace Lame, MD [address] [place], [place] [address] Phone: [phone] Fax : [phone]  Primary Care Physician:  Grace Jude, MD Primary Gastroenterologist:  Dr. Allen Hester  Pre-Procedure History & Physical: HPI:  Grace Hester is a 55 y.o. female is here for an endoscopy and colonoscopy.   Past Medical History  Diagnosis Date  . Abdominal pain   . Anemia   . Anxiety   . Hypertension   . Kidney stones   . GERD (gastroesophageal reflux disease)   . Billowing mitral valve   . PVC (premature ventricular contraction)   . Depression   . Complication of anesthesia     pneumonia after anesthesia  . Septic shock ([place]) 2015  . Sciatica of left side     Past Surgical History  Procedure Laterality Date  . Augmentation mammaplasty  2008  . Abdominal hysterectomy    . Rotator cuff repair Left   . Flexor tendon repair Left 08/04/13    Dr. Elvina Mattes, [facility]  . Kidney stone surgery  2015    Prior to Admission medications   Medication Sig Start Date End Date Taking? Authorizing Provider  ARIPiprazole (ABILIFY) 2 MG tablet Take 2 mg by mouth daily.  06/18/15  Yes Historical Provider, MD  esomeprazole (NEXIUM) 20 MG capsule Take 20 mg by mouth daily at 12 noon.    Yes Historical Provider, MD  fexofenadine (ALLEGRA) 180 MG tablet Take 180 mg by mouth daily.    Yes Historical Provider, MD  gabapentin (NEURONTIN) 300 MG capsule Take 300 mg by mouth 3 (three) times daily.  10/30/14  Yes Historical Provider, MD  hydrochlorothiazide (HYDRODIURIL) 12.5 MG tablet Take 12.5 mg by mouth 2 (two) times daily.  04/08/15  Yes Historical Provider, MD  metoprolol succinate (TOPROL-XL) 50 MG 24 hr tablet Take 50 mg by mouth daily.  12/29/14  Yes Historical Provider, MD  sertraline (ZOLOFT) 100 MG tablet Take 100 mg by mouth at bedtime.  10/30/14  Yes Historical Provider, MD    Allergies as of 02/15/2016 - Review Complete 02/14/2016  Allergen Reaction Noted  . Latex Other (See Comments)  02/13/2016  . Tape  02/13/2016    Family History  Problem Relation Age of Onset  . Heart disease Mother   . Stroke Mother   . Heart disease Father     Social History   Social History  . Marital Status: Married    Spouse Name: N/A  . Number of Children: N/A  . Years of Education: N/A   Occupational History  . Not on file.   Social History Main Topics  . Smoking status: Former Smoker    Quit date: 01/27/2000  . Smokeless tobacco: Never Used  . Alcohol Use: No  . Drug Use: No  . Sexual Activity: Not on file   Other Topics Concern  . Not on file   Social History Narrative    Review of Systems: See HPI, otherwise negative ROS  Physical Exam: BP 118/83 mmHg  Pulse 81  Temp(Src) 97.8 F (36.6 C) (Temporal)  Ht 5\' 11"  (1.803 m)  Wt 216 lb (97.977 kg)  BMI 30.14 kg/m2  SpO2 96% General:   Alert,  pleasant and cooperative in NAD Head:  Normocephalic and atraumatic. Neck:  Supple; no masses or thyromegaly. Lungs:  Clear throughout to auscultation.    Heart:  Regular rate and rhythm. Abdomen:  Soft, nontender and nondistended. Normal bowel sounds, without guarding, and without rebound.   Neurologic:  Alert  and  oriented x4;  grossly normal neurologically.  Impression/Plan: Grace Hester is here for an endoscopy and colonoscopy to be performed for nausea and diarrhea  Risks, benefits, limitations, and alternatives regarding  endoscopy and colonoscopy have been reviewed with the patient.  Questions have been answered.  All parties agreeable.   Grace Lame, MD  03/14/2016, 10:15 AM

## 2016-03-14 NOTE — Op Note (Signed)
[facility] Gastroenterology Patient Name: Grace Hester Procedure Date: 03/14/2016 10:27 AM MRN: [id number] Account #: [id number] Date of Birth: 05/21/1961 Admit Type: Outpatient Age: 55 Room: [facility] OR ROOM 01 Gender: Female Note Status: Finalized Procedure:            Upper GI endoscopy Indications:          Diarrhea Providers:            Lucilla Lame MD, MD Referring MD:         Lynnell Jude (Referring MD) Medicines:            Propofol per Anesthesia Complications:        No immediate complications. Procedure:            Pre-Anesthesia Assessment:                       - Prior to the procedure, a History and Physical was                        performed, and patient medications and allergies were                        reviewed. The patient's tolerance of previous                        anesthesia was also reviewed. The risks and benefits of                        the procedure and the sedation options and risks were                        discussed with the patient. All questions were                        answered, and informed consent was obtained. Prior                        Anticoagulants: The patient has taken no previous                        anticoagulant or antiplatelet agents. ASA Grade                        Assessment: II - A patient with mild systemic disease.                        After reviewing the risks and benefits, the patient was                        deemed in satisfactory condition to undergo the                        procedure.                       After obtaining informed consent, the endoscope was                        passed under direct vision. Throughout the procedure,  the patient's blood pressure, pulse, and oxygen                        saturations were monitored continuously. The was                        introduced through the mouth, and advanced to the                        second part of  duodenum. The upper GI endoscopy was                        accomplished without difficulty. The patient tolerated                        the procedure well. Findings:      The examined esophagus was normal.      Localized mild inflammation characterized by erythema was found in the       gastric antrum. Biopsies were taken with a cold forceps for histology.      Diffuse mildly erythematous mucosa without active bleeding and with no       stigmata of bleeding was found in the duodenal bulb. Impression:           - Normal esophagus.                       - Gastritis. Biopsied.                       - Erythematous duodenopathy. Recommendation:       - Await pathology results. Procedure Code(s):    --- Professional ---                       [phone], Esophagogastroduodenoscopy, flexible, transoral;                        with biopsy, single or multiple Diagnosis Code(s):    --- Professional ---                       R19.7, Diarrhea, unspecified                       K29.70, Gastritis, unspecified, without bleeding                       K31.89, Other diseases of stomach and duodenum CPT copyright 2016 American Medical Association. All rights reserved. The codes documented in this report are preliminary and upon coder review may  be revised to meet current compliance requirements. Lucilla Lame MD, MD 03/14/2016 12:24:15 PM This report has been signed electronically. Number of Addenda: 0 Note Initiated On: 03/14/2016 10:27 AM Total Procedure Duration: 0 hours 5 minutes 17 seconds       [facility]

## 2016-03-14 NOTE — Anesthesia Procedure Notes (Signed)
Procedure Name: MAC Performed by: Katora Fini Pre-anesthesia Checklist: Patient identified, Emergency Drugs available, Suction available, Timeout performed and Patient being monitored Patient Re-evaluated:Patient Re-evaluated prior to inductionOxygen Delivery Method: Nasal cannula Placement Confirmation: positive ETCO2       

## 2016-03-14 NOTE — Anesthesia Postprocedure Evaluation (Signed)
Anesthesia Post Note  Patient: Grace Hester  Procedure(s) Performed: Procedure(s) (LRB): COLONOSCOPY WITH PROPOFOL (N/A) ESOPHAGOGASTRODUODENOSCOPY (EGD) WITH PROPOFOL (N/A) POLYPECTOMY  Patient location during evaluation: PACU Anesthesia Type: MAC Level of consciousness: awake and alert and oriented Pain management: pain level controlled Vital Signs Assessment: post-procedure vital signs reviewed and stable Respiratory status: spontaneous breathing and nonlabored ventilation Cardiovascular status: stable Postop Assessment: no signs of nausea or vomiting and adequate PO intake Anesthetic complications: no    Estill Batten

## 2016-03-14 NOTE — Transfer of Care (Signed)
Immediate Anesthesia Transfer of Care Note  Patient: Grace Hester  Procedure(s) Performed: Procedure(s): COLONOSCOPY WITH PROPOFOL (N/A) ESOPHAGOGASTRODUODENOSCOPY (EGD) WITH PROPOFOL (N/A) POLYPECTOMY  Patient Location: PACU  Anesthesia Type: MAC  Level of Consciousness: awake, alert  and patient cooperative  Airway and Oxygen Therapy: Patient Spontanous Breathing and Patient connected to supplemental oxygen  Post-op Assessment: Post-op Vital signs reviewed, Patient's Cardiovascular Status Stable, Respiratory Function Stable, Patent Airway and No signs of Nausea or vomiting  Post-op Vital Signs: Reviewed and stable  Complications: No apparent anesthesia complications

## 2016-03-17 ENCOUNTER — Encounter: Payer: Self-pay | Admitting: Gastroenterology

## 2016-03-18 ENCOUNTER — Encounter: Payer: Self-pay | Admitting: Gastroenterology

## 2016-03-21 ENCOUNTER — Telehealth: Payer: Self-pay

## 2016-03-21 NOTE — Telephone Encounter (Signed)
-----   Message from Lucilla Lame, MD sent at 03/19/2016  7:55 AM EDT ----- The patient had a adenoma in the colon and multiple other findings that needs reviewed in the office. She needs a repeat colonoscopy in 5 years.

## 2016-03-21 NOTE — Telephone Encounter (Signed)
Pt scheduled for a follow up appt with Dr. Allen Norris on 05/01/16.

## 2016-04-03 ENCOUNTER — Ambulatory Visit
Admission: EM | Admit: 2016-04-03 | Discharge: 2016-04-03 | Disposition: A | Discharge status: Home / Self Care | Payer: 59 | Attending: Family Medicine | Admitting: Family Medicine

## 2016-04-03 ENCOUNTER — Encounter: Payer: Self-pay | Admitting: Emergency Medicine

## 2016-04-03 DIAGNOSIS — J069 Acute upper respiratory infection, unspecified: Secondary | ICD-10-CM | POA: Diagnosis not present

## 2016-04-03 MED ORDER — ALBUTEROL SULFATE HFA 108 (90 BASE) MCG/ACT IN AERS: 1.0000 | INHALATION_SPRAY | Freq: Four times a day (QID) | RESPIRATORY_TRACT | 0 refills | Status: AC | PRN

## 2016-04-03 MED ORDER — HYDROCOD POLST-CPM POLST ER 10-8 MG/5ML PO SUER: 5.0000 mL | Freq: Two times a day (BID) | ORAL | 0 refills | Status: DC

## 2016-04-03 MED ORDER — BENZONATATE 200 MG PO CAPS: 200.0000 mg | ORAL_CAPSULE | Freq: Three times a day (TID) | ORAL | 0 refills | Status: DC

## 2016-04-03 NOTE — ED Triage Notes (Signed)
Patient c/o cough and chest congestion, sinus pressure and congestion and HAs for the past 3 days.

## 2016-04-03 NOTE — ED Provider Notes (Signed)
CSN: [id number]     Arrival date & time 04/03/16  1340 History   First MD Initiated Contact with Patient 04/03/16 1444     Chief Complaint  Patient presents with  . Facial Pain  . Nasal Congestion   (Consider location/radiation/quality/duration/timing/severity/associated sxs/prior Treatment) HPI   This a 55 year old female who presents with 2 day history of cough and chest congestion with sinus pressure and headaches. This started about 3 days ago. She states that prior to this her brother was sick and they had accompanied each other on a 6 hour trip to the beach. At that time he was coughing and sneezing using an inhaler. She has not had a fever or chills. She states that her right sinus is tender and painful particularly when she bends forward. Her primary care physician is Dr. Clemmie Krill who is out of town this week   Past Medical History:  Diagnosis Date  . Abdominal pain   . Anemia   . Anxiety   . Billowing mitral valve   . Complication of anesthesia    pneumonia after anesthesia  . Depression   . GERD (gastroesophageal reflux disease)   . Hypertension   . Kidney stones   . PVC (premature ventricular contraction)   . Sciatica of left side   . Septic shock ([place]) 2015   Past Surgical History:  Procedure Laterality Date  . ABDOMINAL HYSTERECTOMY    . AUGMENTATION MAMMAPLASTY  2008  . COLONOSCOPY WITH PROPOFOL N/A 03/14/2016   Procedure: COLONOSCOPY WITH PROPOFOL;  Surgeon: Lucilla Lame, MD;  Location: [place];  Service: Endoscopy;  Laterality: N/A;  . ESOPHAGOGASTRODUODENOSCOPY (EGD) WITH PROPOFOL N/A 03/14/2016   Procedure: ESOPHAGOGASTRODUODENOSCOPY (EGD) WITH PROPOFOL;  Surgeon: Lucilla Lame, MD;  Location: [place];  Service: Endoscopy;  Laterality: N/A;  . FLEXOR TENDON REPAIR Left 08/04/13   Dr. Elvina Mattes, [facility]  . KIDNEY STONE SURGERY  2015  . POLYPECTOMY  03/14/2016   Procedure: POLYPECTOMY;  Surgeon: Lucilla Lame, MD;  Location: [place];   Service: Endoscopy;;  . ROTATOR CUFF REPAIR Left    Family History  Problem Relation Age of Onset  . Heart disease Mother   . Stroke Mother   . Heart disease Father    Social History  Substance Use Topics  . Smoking status: Former Smoker    Quit date: 01/27/2000  . Smokeless tobacco: Never Used  . Alcohol use No   OB History    No data available     Review of Systems  Constitutional: Positive for activity change and fatigue. Negative for chills and fever.  HENT: Positive for congestion, postnasal drip, rhinorrhea and sinus pressure.   Respiratory: Positive for cough and shortness of breath.   All other systems reviewed and are negative.   Allergies  Latex and Tape  Home Medications   Prior to Admission medications   Medication Sig Start Date End Date Taking? Authorizing Provider  albuterol (PROVENTIL HFA;VENTOLIN HFA) 108 (90 Base) MCG/ACT inhaler Inhale 1-2 puffs into the lungs every 6 (six) hours as needed for wheezing or shortness of breath. 04/03/16   Lorin Picket, PA-C  ARIPiprazole (ABILIFY) 2 MG tablet Take 2 mg by mouth daily.  06/18/15   Historical Provider, MD  benzonatate (TESSALON) 200 MG capsule Take 1 capsule (200 mg total) by mouth every 8 (eight) hours. 04/03/16   Lorin Picket, PA-C  chlorpheniramine-HYDROcodone (TUSSIONEX PENNKINETIC ER) 10-8 MG/5ML SUER Take 5 mLs by mouth 2 (two) times daily. 04/03/16  Lorin Picket, PA-C  esomeprazole (NEXIUM) 20 MG capsule Take 20 mg by mouth daily at 12 noon.     Historical Provider, MD  fexofenadine (ALLEGRA) 180 MG tablet Take 180 mg by mouth daily.     Historical Provider, MD  gabapentin (NEURONTIN) 300 MG capsule Take 300 mg by mouth 3 (three) times daily.  10/30/14   Historical Provider, MD  hydrochlorothiazide (HYDRODIURIL) 12.5 MG tablet Take 12.5 mg by mouth 2 (two) times daily.  04/08/15   Historical Provider, MD  metoprolol succinate (TOPROL-XL) 50 MG 24 hr tablet Take 50 mg by mouth daily.  12/29/14    Historical Provider, MD  sertraline (ZOLOFT) 100 MG tablet Take 100 mg by mouth at bedtime.  10/30/14   Historical Provider, MD   Meds Ordered and Administered this Visit  Medications - No data to display  BP (!) 142/77 (BP Location: Left Arm)   Pulse 85   Temp 97.2 F (36.2 C) (Tympanic)   Resp 16   SpO2 96%  No data found.   Physical Exam  Constitutional: She is oriented to person, place, and time. She appears well-developed and well-nourished. No distress.  HENT:  Head: Normocephalic and atraumatic.  Right Ear: External ear normal.  Left Ear: External ear normal.  Nose: Nose normal.  Mouth/Throat: Oropharynx is clear and moist. No oropharyngeal exudate.  Eyes: Conjunctivae and EOM are normal. Pupils are equal, round, and reactive to light. Right eye exhibits no discharge. Left eye exhibits no discharge.  Neck: Normal range of motion. Neck supple.  Pulmonary/Chest: Breath sounds normal. She is in respiratory distress. She has no wheezes. She has no rales.  Musculoskeletal: Normal range of motion. She exhibits no edema, tenderness or deformity.  Lymphadenopathy:    She has no cervical adenopathy.  Neurological: She is alert and oriented to person, place, and time.  Skin: Skin is warm and dry. She is not diaphoretic.  Psychiatric: She has a normal mood and affect. Her behavior is normal. Judgment and thought content normal.  Nursing note and vitals reviewed.   Urgent Care Course   Clinical Course    Procedures (including critical care time)  Labs Review Labs Reviewed - No data to display  Imaging Review No results found.   Visual Acuity Review  Right Eye Distance:   Left Eye Distance:   Bilateral Distance:    Right Eye Near:   Left Eye Near:    Bilateral Near:         MDM   1. URI (upper respiratory infection)    Discharge Medication List as of 04/03/2016  3:00 PM    START taking these medications   Details  albuterol (PROVENTIL HFA;VENTOLIN HFA)  108 (90 Base) MCG/ACT inhaler Inhale 1-2 puffs into the lungs every 6 (six) hours as needed for wheezing or shortness of breath., Starting Thu 04/03/2016, Normal    benzonatate (TESSALON) 200 MG capsule Take 1 capsule (200 mg total) by mouth every 8 (eight) hours., Starting Thu 04/03/2016, Normal    chlorpheniramine-HYDROcodone (TUSSIONEX PENNKINETIC ER) 10-8 MG/5ML SUER Take 5 mLs by mouth 2 (two) times daily., Starting Thu 04/03/2016, Print      Plan: 1. Test/x-ray results and diagnosis reviewed with patient 2. rx as per orders; risks, benefits, potential side effects reviewed with patient 3. Recommend supportive treatment with Increased fluids and rest. Told her that this is most likely a viral illness and does not require antibiotics at this time. However if she does develop a very  productive cough or high fevers she should contact us with her primary care physician Dr. Clemmie Krill. 4. F/u prn if symptoms worsen or don't improve     Lorin Picket, PA-C 04/03/16 1509

## 2016-05-01 ENCOUNTER — Other Ambulatory Visit: Payer: Self-pay

## 2016-05-01 ENCOUNTER — Encounter: Payer: Self-pay | Admitting: Gastroenterology

## 2016-05-01 ENCOUNTER — Ambulatory Visit (INDEPENDENT_AMBULATORY_CARE_PROVIDER_SITE_OTHER): Payer: 59 | Admitting: Gastroenterology

## 2016-05-01 VITALS — BP 143/81 | HR 83 | Temp 98.6°F | Ht 71.0 in | Wt 227.0 lb

## 2016-05-01 DIAGNOSIS — R197 Diarrhea, unspecified: Secondary | ICD-10-CM | POA: Diagnosis not present

## 2016-05-02 NOTE — Progress Notes (Signed)
Primary Care Physician: Lynnell Jude, MD  Primary Gastroenterologist:  Dr. Lucilla Lame  Chief Complaint  Patient presents with  . Follow up Colonoscopy and EGD results    HPI: Grace Hester is a 55 y.o. female here for follow-up after having an EGD and colonoscopy.  The patient had these procedures done because of her diarrhea.  The patient continues to have diarrhea but has not had any weight loss.  The patient's colonoscopy showed some inflammation in the terminal ileum that was reported as nonspecific and not wholly consistent with Inflammatory bowel disease.  Current Outpatient Prescriptions  Medication Sig Dispense Refill  . albuterol (PROVENTIL HFA;VENTOLIN HFA) 108 (90 Base) MCG/ACT inhaler Inhale 1-2 puffs into the lungs every 6 (six) hours as needed for wheezing or shortness of breath. 1 Inhaler 0  . ALPRAZolam (XANAX) 0.5 MG tablet TAKE 1 TABLET BY MOUTH 3 TIMES A DAY AS NEEDED FOR ANXIETY    . ARIPiprazole (ABILIFY) 2 MG tablet Take 2 mg by mouth daily.     Marland Kitchen esomeprazole (NEXIUM) 20 MG capsule Take 20 mg by mouth daily at 12 noon.     . fexofenadine (ALLEGRA) 180 MG tablet Take 180 mg by mouth daily.     Marland Kitchen gabapentin (NEURONTIN) 300 MG capsule Take 300 mg by mouth 3 (three) times daily.     . hydrochlorothiazide (HYDRODIURIL) 12.5 MG tablet Take 12.5 mg by mouth 2 (two) times daily.     . metoprolol succinate (TOPROL-XL) 50 MG 24 hr tablet Take 50 mg by mouth daily.     . sertraline (ZOLOFT) 100 MG tablet Take 100 mg by mouth at bedtime.      No current facility-administered medications for this visit.     Allergies as of 05/01/2016 - Review Complete 05/01/2016  Allergen Reaction Noted  . Latex Other (See Comments) 02/13/2016  . Tape Itching 02/13/2016    ROS:  General: Negative for anorexia, weight loss, fever, chills, fatigue, weakness. ENT: Negative for hoarseness, difficulty swallowing , nasal congestion. CV: Negative for chest pain, angina,  palpitations, dyspnea on exertion, peripheral edema.  Respiratory: Negative for dyspnea at rest, dyspnea on exertion, cough, sputum, wheezing.  GI: See history of present illness. GU:  Negative for dysuria, hematuria, urinary incontinence, urinary frequency, nocturnal urination.  Endo: Negative for unusual weight change.    Physical Examination:   BP (!) 143/81   Pulse 83   Temp 98.6 F (37 C) (Oral)   Ht 5\' 11"  (1.803 m)   Wt 227 lb (103 kg)   BMI 31.66 kg/m   General: Well-nourished, well-developed in no acute distress.  Eyes: No icterus. Conjunctivae pink. Mouth: Oropharyngeal mucosa moist and pink , no lesions erythema or exudate. Lungs: Clear to auscultation bilaterally. Non-labored. Heart: Regular rate and rhythm, no murmurs rubs or gallops.  Abdomen: Bowel sounds are normal, nontender, nondistended, no hepatosplenomegaly or masses, no abdominal bruits or hernia , no rebound or guarding.   Extremities: No lower extremity edema. No clubbing or deformities. Neuro: Alert and oriented x 3.  Grossly intact. Skin: Warm and dry, no jaundice.   Psych: Alert and cooperative, normal mood and affect.  Labs:    Imaging Studies: No results found.  Assessment and Plan:   Grace Hester is a 55 y.o. y/o female Who comes in today with a history of diarrhea that she states has not gotten better.  The patient had a colonoscopy and upper endoscopy with the findings not suggestive of celiac  sprue with some acute inflammation  In the terminal ileum that was reported not to be consistent with  Inflammatory bowel disease. The patient does drink milk at home and has been told to avoid milk products and dairy products for 1 week to see if that helps her symptoms.  If they do not she may need to be sent off for blood test to evaluate for possible markers for inflammatory bowel disease.  The patient has been explained the plan and will call the office in a week to let us know if the lactose free  diet was beneficial.   Note: This dictation was prepared with Dragon dictation along with smaller phrase technology. Any transcriptional errors that result from this process are unintentional.

## 2016-10-28 ENCOUNTER — Inpatient Hospital Stay
Admission: EM | Admit: 2016-10-28 | Discharge: 2016-10-31 | DRG: 392 | Disposition: A | Discharge status: Home / Self Care | Payer: 59 | Attending: Internal Medicine | Admitting: Internal Medicine

## 2016-10-28 ENCOUNTER — Emergency Department: Payer: 59

## 2016-10-28 DIAGNOSIS — E876 Hypokalemia: Secondary | ICD-10-CM | POA: Diagnosis present

## 2016-10-28 DIAGNOSIS — Z91048 Other nonmedicinal substance allergy status: Secondary | ICD-10-CM

## 2016-10-28 DIAGNOSIS — F329 Major depressive disorder, single episode, unspecified: Secondary | ICD-10-CM | POA: Diagnosis present

## 2016-10-28 DIAGNOSIS — Z87891 Personal history of nicotine dependence: Secondary | ICD-10-CM | POA: Diagnosis not present

## 2016-10-28 DIAGNOSIS — F419 Anxiety disorder, unspecified: Secondary | ICD-10-CM | POA: Diagnosis present

## 2016-10-28 DIAGNOSIS — K219 Gastro-esophageal reflux disease without esophagitis: Secondary | ICD-10-CM | POA: Diagnosis present

## 2016-10-28 DIAGNOSIS — Z87442 Personal history of urinary calculi: Secondary | ICD-10-CM | POA: Diagnosis not present

## 2016-10-28 DIAGNOSIS — K5792 Diverticulitis of intestine, part unspecified, without perforation or abscess without bleeding: Secondary | ICD-10-CM | POA: Diagnosis present

## 2016-10-28 DIAGNOSIS — G4730 Sleep apnea, unspecified: Secondary | ICD-10-CM | POA: Diagnosis present

## 2016-10-28 DIAGNOSIS — Z91040 Latex allergy status: Secondary | ICD-10-CM

## 2016-10-28 DIAGNOSIS — Z823 Family history of stroke: Secondary | ICD-10-CM | POA: Diagnosis not present

## 2016-10-28 DIAGNOSIS — I10 Essential (primary) hypertension: Secondary | ICD-10-CM | POA: Diagnosis present

## 2016-10-28 DIAGNOSIS — Z86010 Personal history of colonic polyps: Secondary | ICD-10-CM | POA: Diagnosis not present

## 2016-10-28 DIAGNOSIS — Z90710 Acquired absence of both cervix and uterus: Secondary | ICD-10-CM

## 2016-10-28 DIAGNOSIS — N390 Urinary tract infection, site not specified: Secondary | ICD-10-CM | POA: Diagnosis present

## 2016-10-28 DIAGNOSIS — R1032 Left lower quadrant pain: Secondary | ICD-10-CM | POA: Diagnosis present

## 2016-10-28 DIAGNOSIS — K5732 Diverticulitis of large intestine without perforation or abscess without bleeding: Principal | ICD-10-CM | POA: Diagnosis present

## 2016-10-28 DIAGNOSIS — Z8249 Family history of ischemic heart disease and other diseases of the circulatory system: Secondary | ICD-10-CM

## 2016-10-28 HISTORY — DX: Unspecified asthma, uncomplicated: J45.909

## 2016-10-28 LAB — COMPREHENSIVE METABOLIC PANEL
ALT: 32 U/L (ref 14–54)
ANION GAP: 9 (ref 5–15)
AST: 28 U/L (ref 15–41)
Albumin: 4.6 g/dL (ref 3.5–5.0)
Alkaline Phosphatase: 71 U/L (ref 38–126)
BUN: 8 mg/dL (ref 6–20)
CHLORIDE: 102 mmol/L (ref 101–111)
CO2: 28 mmol/L (ref 22–32)
Calcium: 9.3 mg/dL (ref 8.9–10.3)
Creatinine, Ser: 0.61 mg/dL (ref 0.44–1.00)
GFR calc non Af Amer: >60 mL/min (ref >60)
Glucose, Bld: 132 mg/dL — ABNORMAL HIGH (ref 65–99)
POTASSIUM: 3.4 mmol/L — AB (ref 3.5–5.1)
Sodium: 139 mmol/L (ref 135–145)
Total Bilirubin: 0.7 mg/dL (ref 0.3–1.2)
Total Protein: 7.2 g/dL (ref 6.5–8.1)

## 2016-10-28 LAB — URINALYSIS, COMPLETE (UACMP) WITH MICROSCOPIC
BILIRUBIN URINE: NEGATIVE
GLUCOSE, UA: NEGATIVE mg/dL
HGB URINE DIPSTICK: NEGATIVE
Ketones, ur: NEGATIVE mg/dL
Leukocytes, UA: NEGATIVE
NITRITE: NEGATIVE
Protein, ur: NEGATIVE mg/dL
SPECIFIC GRAVITY, URINE: 1.01 (ref 1.005–1.030)
pH: 7 (ref 5.0–8.0)

## 2016-10-28 LAB — CBC
HCT: 40.3 % (ref 35.0–47.0)
Hemoglobin: 14 g/dL (ref 12.0–16.0)
MCH: 30.7 pg (ref 26.0–34.0)
MCHC: 34.8 g/dL (ref 32.0–36.0)
MCV: 88.4 fL (ref 80.0–100.0)
Platelets: 137 10*3/uL — ABNORMAL LOW (ref 150–440)
RBC: 4.56 MIL/uL (ref 3.80–5.20)
RDW: 14.1 % (ref 11.5–14.5)
WBC: 7.2 10*3/uL (ref 3.6–11.0)

## 2016-10-28 LAB — RAPID HIV SCREEN (HIV 1/2 AB+AG)
HIV 1/2 Antibodies: NONREACTIVE
HIV-1 P24 ANTIGEN - HIV24: NONREACTIVE

## 2016-10-28 MED ORDER — GABAPENTIN 300 MG PO CAPS: 300.0000 mg | ORAL_CAPSULE | Freq: Three times a day (TID) | ORAL | Status: DC

## 2016-10-28 MED ORDER — POLYETHYLENE GLYCOL 3350 17 G PO PACK: 17.0000 g | PACK | Freq: Every day | ORAL | Status: DC | PRN

## 2016-10-28 MED ORDER — HYDROCODONE-ACETAMINOPHEN 5-325 MG PO TABS
ORAL_TABLET | ORAL | Status: AC
  Administered 2016-10-28: 2 via ORAL
  Filled 2016-10-28: qty 2

## 2016-10-28 MED ORDER — ENOXAPARIN SODIUM 40 MG/0.4ML ~~LOC~~ SOLN
40.0000 mg | SUBCUTANEOUS | Status: DC
  Administered 2016-10-28 – 2016-10-30 (×3): 40 mg via SUBCUTANEOUS
  Filled 2016-10-28 (×3): qty 0.4

## 2016-10-28 MED ORDER — SODIUM CHLORIDE 0.9% FLUSH
3.0000 mL | Freq: Two times a day (BID) | INTRAVENOUS | Status: DC
  Administered 2016-10-29 – 2016-10-30 (×3): 3 mL via INTRAVENOUS

## 2016-10-28 MED ORDER — METRONIDAZOLE 500 MG PO TABS
ORAL_TABLET | ORAL | Status: AC
  Administered 2016-10-28: 500 mg via ORAL
  Filled 2016-10-28: qty 1

## 2016-10-28 MED ORDER — SODIUM CHLORIDE 0.9 % IV SOLN
INTRAVENOUS | Status: AC
  Administered 2016-10-28 – 2016-10-29 (×3): via INTRAVENOUS

## 2016-10-28 MED ORDER — GABAPENTIN 300 MG PO CAPS
300.0000 mg | ORAL_CAPSULE | Freq: Two times a day (BID) | ORAL | Status: DC
  Administered 2016-10-29 – 2016-10-31 (×5): 300 mg via ORAL
  Filled 2016-10-28 (×5): qty 1

## 2016-10-28 MED ORDER — METRONIDAZOLE IN NACL 5-0.79 MG/ML-% IV SOLN
INTRAVENOUS | Status: AC
  Administered 2016-10-28: 500 mg via INTRAVENOUS
  Filled 2016-10-28: qty 100

## 2016-10-28 MED ORDER — IOPAMIDOL (ISOVUE-300) INJECTION 61%
30.0000 mL | Freq: Once | INTRAVENOUS | Status: AC | PRN
  Administered 2016-10-28: 30 mL via ORAL
  Filled 2016-10-28: qty 30

## 2016-10-28 MED ORDER — GABAPENTIN 300 MG PO CAPS
900.0000 mg | ORAL_CAPSULE | Freq: Every day | ORAL | Status: DC
  Administered 2016-10-28 – 2016-10-30 (×3): 900 mg via ORAL
  Filled 2016-10-28 (×3): qty 3

## 2016-10-28 MED ORDER — CIPROFLOXACIN HCL 500 MG PO TABS
500.0000 mg | ORAL_TABLET | Freq: Once | ORAL | Status: AC
  Administered 2016-10-28: 500 mg via ORAL

## 2016-10-28 MED ORDER — METRONIDAZOLE 500 MG PO TABS: 500.0000 mg | ORAL_TABLET | Freq: Three times a day (TID) | ORAL | 0 refills | Status: DC

## 2016-10-28 MED ORDER — ONDANSETRON HCL 4 MG/2ML IJ SOLN
INTRAMUSCULAR | Status: AC
  Administered 2016-10-28: 4 mg via INTRAVENOUS
  Filled 2016-10-28: qty 2

## 2016-10-28 MED ORDER — ONDANSETRON 4 MG PO TBDP: 4.0000 mg | ORAL_TABLET | Freq: Three times a day (TID) | ORAL | 0 refills | Status: DC | PRN

## 2016-10-28 MED ORDER — METRONIDAZOLE 500 MG PO TABS
500.0000 mg | ORAL_TABLET | Freq: Once | ORAL | Status: AC
  Administered 2016-10-28: 500 mg via ORAL

## 2016-10-28 MED ORDER — ALPRAZOLAM 0.5 MG PO TABS: 0.5000 mg | ORAL_TABLET | Freq: Two times a day (BID) | ORAL | Status: DC | PRN

## 2016-10-28 MED ORDER — ONDANSETRON HCL 4 MG/2ML IJ SOLN
4.0000 mg | Freq: Once | INTRAMUSCULAR | Status: AC
  Administered 2016-10-28: 4 mg via INTRAVENOUS
  Filled 2016-10-28: qty 2

## 2016-10-28 MED ORDER — HYDROCODONE-ACETAMINOPHEN 5-325 MG PO TABS: 1.0000 | ORAL_TABLET | ORAL | 0 refills | Status: DC | PRN

## 2016-10-28 MED ORDER — METRONIDAZOLE IN NACL 5-0.79 MG/ML-% IV SOLN
500.0000 mg | Freq: Three times a day (TID) | INTRAVENOUS | Status: DC
Start: 2016-10-28 — End: 2016-10-31
  Administered 2016-10-28 – 2016-10-31 (×8): 500 mg via INTRAVENOUS
  Filled 2016-10-28 (×9): qty 100

## 2016-10-28 MED ORDER — HYDROCODONE-ACETAMINOPHEN 5-325 MG PO TABS
1.0000 | ORAL_TABLET | ORAL | Status: DC | PRN
  Administered 2016-10-28 – 2016-10-29 (×3): 2 via ORAL
  Filled 2016-10-28 (×3): qty 2

## 2016-10-28 MED ORDER — CIPROFLOXACIN HCL 500 MG PO TABS
ORAL_TABLET | ORAL | Status: AC
  Administered 2016-10-28: 500 mg via ORAL
  Filled 2016-10-28: qty 1

## 2016-10-28 MED ORDER — ONDANSETRON HCL 4 MG PO TABS: 4.0000 mg | ORAL_TABLET | Freq: Four times a day (QID) | ORAL | Status: DC | PRN

## 2016-10-28 MED ORDER — ONDANSETRON HCL 4 MG/2ML IJ SOLN
4.0000 mg | Freq: Once | INTRAMUSCULAR | Status: AC
  Administered 2016-10-28: 4 mg via INTRAVENOUS

## 2016-10-28 MED ORDER — ACETAMINOPHEN 325 MG RE SUPP
650.0000 mg | Freq: Four times a day (QID) | RECTAL | Status: DC | PRN
  Filled 2016-10-28: qty 2

## 2016-10-28 MED ORDER — LORATADINE 10 MG PO TABS
10.0000 mg | ORAL_TABLET | Freq: Every day | ORAL | Status: DC
  Administered 2016-10-29 – 2016-10-31 (×3): 10 mg via ORAL
  Filled 2016-10-28 (×3): qty 1

## 2016-10-28 MED ORDER — ALBUTEROL SULFATE (2.5 MG/3ML) 0.083% IN NEBU: 3.0000 mL | INHALATION_SOLUTION | Freq: Four times a day (QID) | RESPIRATORY_TRACT | Status: DC | PRN

## 2016-10-28 MED ORDER — SODIUM CHLORIDE 0.9 % IV BOLUS (SEPSIS)
1000.0000 mL | Freq: Once | INTRAVENOUS | Status: AC
  Administered 2016-10-28: 1000 mL via INTRAVENOUS

## 2016-10-28 MED ORDER — SERTRALINE HCL 50 MG PO TABS
200.0000 mg | ORAL_TABLET | Freq: Every day | ORAL | Status: DC
  Administered 2016-10-28 – 2016-10-30 (×3): 200 mg via ORAL
  Filled 2016-10-28 (×3): qty 4

## 2016-10-28 MED ORDER — METOPROLOL SUCCINATE ER 50 MG PO TB24
50.0000 mg | ORAL_TABLET | Freq: Every day | ORAL | Status: DC
  Administered 2016-10-28 – 2016-10-31 (×4): 50 mg via ORAL
  Filled 2016-10-28 (×5): qty 1

## 2016-10-28 MED ORDER — PANTOPRAZOLE SODIUM 40 MG PO TBEC
40.0000 mg | DELAYED_RELEASE_TABLET | Freq: Every day | ORAL | Status: DC
  Administered 2016-10-28 – 2016-10-31 (×4): 40 mg via ORAL
  Filled 2016-10-28 (×4): qty 1

## 2016-10-28 MED ORDER — ACETAMINOPHEN 325 MG PO TABS: 650.0000 mg | ORAL_TABLET | Freq: Four times a day (QID) | ORAL | Status: DC | PRN

## 2016-10-28 MED ORDER — CIPROFLOXACIN IN D5W 400 MG/200ML IV SOLN
INTRAVENOUS | Status: AC
  Administered 2016-10-28: 400 mg via INTRAVENOUS
  Filled 2016-10-28: qty 200

## 2016-10-28 MED ORDER — MORPHINE SULFATE (PF) 4 MG/ML IV SOLN
4.0000 mg | Freq: Once | INTRAVENOUS | Status: AC
  Administered 2016-10-28: 4 mg via INTRAVENOUS
  Filled 2016-10-28: qty 1

## 2016-10-28 MED ORDER — ORAL CARE MOUTH RINSE
15.0000 mL | Freq: Two times a day (BID) | OROMUCOSAL | Status: DC
  Administered 2016-10-29 – 2016-10-30 (×2): 15 mL via OROMUCOSAL

## 2016-10-28 MED ORDER — CIPROFLOXACIN HCL 500 MG PO TABS: 500.0000 mg | ORAL_TABLET | Freq: Two times a day (BID) | ORAL | 0 refills | Status: DC

## 2016-10-28 MED ORDER — ONDANSETRON HCL 4 MG/2ML IJ SOLN
4.0000 mg | Freq: Four times a day (QID) | INTRAMUSCULAR | Status: DC | PRN
  Administered 2016-10-29: 4 mg via INTRAVENOUS
  Filled 2016-10-28: qty 2

## 2016-10-28 MED ORDER — IOPAMIDOL (ISOVUE-300) INJECTION 61%
100.0000 mL | Freq: Once | INTRAVENOUS | Status: AC | PRN
  Administered 2016-10-28: 100 mL via INTRAVENOUS
  Filled 2016-10-28: qty 100

## 2016-10-28 MED ORDER — HYDROCODONE-ACETAMINOPHEN 5-325 MG PO TABS
2.0000 | ORAL_TABLET | Freq: Once | ORAL | Status: AC
  Administered 2016-10-28: 2 via ORAL

## 2016-10-28 MED ORDER — ARIPIPRAZOLE 2 MG PO TABS
2.0000 mg | ORAL_TABLET | Freq: Every day | ORAL | Status: DC
  Administered 2016-10-28 – 2016-10-31 (×4): 2 mg via ORAL
  Filled 2016-10-28 (×5): qty 1

## 2016-10-28 MED ORDER — CIPROFLOXACIN IN D5W 400 MG/200ML IV SOLN
400.0000 mg | Freq: Two times a day (BID) | INTRAVENOUS | Status: DC
Start: 2016-10-28 — End: 2016-10-31
  Administered 2016-10-28 – 2016-10-31 (×6): 400 mg via INTRAVENOUS
  Filled 2016-10-28 (×6): qty 200

## 2016-10-28 MED ORDER — CHLORHEXIDINE GLUCONATE 0.12 % MT SOLN
15.0000 mL | Freq: Two times a day (BID) | OROMUCOSAL | Status: DC
Start: 2016-10-28 — End: 2016-10-31
  Administered 2016-10-29 – 2016-10-30 (×4): 15 mL via OROMUCOSAL
  Filled 2016-10-28 (×4): qty 15

## 2016-10-28 NOTE — ED Notes (Signed)
Pt reports threw up in bathroom.

## 2016-10-28 NOTE — ED Notes (Signed)
Attempted IV access x 2. Unsuccessful. 

## 2016-10-28 NOTE — H&P (Signed)
[place] [facility] [place] NAME: Grace Hester    MR#:  [id number]  DATE OF BIRTH:  1961/04/20  DATE OF ADMISSION:  10/28/2016  PRIMARY CARE PHYSICIAN: BLISS, Lynnell Jude, MD   REQUESTING/REFERRING PHYSICIAN: Dr. Kerman Passey  CHIEF COMPLAINT:   Chief Complaint  Patient presents with  . Urinary Frequency    HISTORY OF PRESENT ILLNESS:  Grace Hester  is a 56 y.o. female with a known history of Hypertension, sleep apnea presents to the emergency room complaining of 3 days of left lower quadrant pain and dysuria. Patient thought this was a urinary tract infection. Thought this would get better but today her pain acutely worsened at work and presented to the emergency room. She is also had on and off vomiting. Temperature of 100.1 on arrival in the emergency room. CT scan of the abdomen and pelvis showed acute sigmoid diverticulitis. Patient received oral antibiotics here and had vomiting and unable to keep anything down and is being admitted to the hospitalist service.  She had colonoscopy 3 months back which showed 2 polyps which have been resected and sigmoid diverticulosis with internal hemorrhoids.  PAST MEDICAL HISTORY:   Past Medical History:  Diagnosis Date  . Abdominal pain   . Anemia   . Anxiety   . Asthma   . Billowing mitral valve   . Complication of anesthesia    pneumonia after anesthesia  . Depression   . GERD (gastroesophageal reflux disease)   . Hypertension   . Kidney stones   . PVC (premature ventricular contraction)   . Sciatica of left side   . Septic shock ([place]) 2015    PAST SURGICAL HISTORY:   Past Surgical History:  Procedure Laterality Date  . ABDOMINAL HYSTERECTOMY    . AUGMENTATION MAMMAPLASTY  2008  . COLONOSCOPY WITH PROPOFOL N/A 03/14/2016   Procedure: COLONOSCOPY WITH PROPOFOL;  Surgeon: Lucilla Lame, MD;  Location: [place];  Service: Endoscopy;  Laterality: N/A;  . ESOPHAGOGASTRODUODENOSCOPY  (EGD) WITH PROPOFOL N/A 03/14/2016   Procedure: ESOPHAGOGASTRODUODENOSCOPY (EGD) WITH PROPOFOL;  Surgeon: Lucilla Lame, MD;  Location: [place];  Service: Endoscopy;  Laterality: N/A;  . FLEXOR TENDON REPAIR Left 08/04/13   Dr. Elvina Mattes, [facility]  . KIDNEY STONE SURGERY  2015  . POLYPECTOMY  03/14/2016   Procedure: POLYPECTOMY;  Surgeon: Lucilla Lame, MD;  Location: [place];  Service: Endoscopy;;  . ROTATOR CUFF REPAIR Left     SOCIAL HISTORY:   Social History  Substance Use Topics  . Smoking status: Former Smoker    Quit date: 01/27/2000  . Smokeless tobacco: Never Used  . Alcohol use No    FAMILY HISTORY:   Family History  Problem Relation Age of Onset  . Heart disease Mother   . Stroke Mother   . Heart disease Father     DRUG ALLERGIES:   Allergies  Allergen Reactions  . Latex Other (See Comments)    Burning, raw skin from underwear elastic  . Tape Itching    Blisters and tears skin    REVIEW OF SYSTEMS:   ROS  MEDICATIONS AT HOME:   Prior to Admission medications   Medication Sig Start Date End Date Taking? Authorizing Provider  albuterol (PROVENTIL HFA;VENTOLIN HFA) 108 (90 Base) MCG/ACT inhaler Inhale 1-2 puffs into the lungs every 6 (six) hours as needed for wheezing or shortness of breath. 04/03/16   Lorin Picket, PA-C  ALPRAZolam Duanne Moron) 0.5 MG tablet TAKE 1 TABLET  BY MOUTH 3 TIMES A DAY AS NEEDED FOR ANXIETY 06/18/15   Historical Provider, MD  ARIPiprazole (ABILIFY) 2 MG tablet Take 2 mg by mouth daily.  06/18/15   Historical Provider, MD  ciprofloxacin (CIPRO) 500 MG tablet Take 1 tablet (500 mg total) by mouth 2 (two) times daily. 10/28/16 11/11/16  Harvest Dark, MD  esomeprazole (NEXIUM) 20 MG capsule Take 20 mg by mouth daily at 12 noon.     Historical Provider, MD  fexofenadine (ALLEGRA) 180 MG tablet Take 180 mg by mouth daily.     Historical Provider, MD  gabapentin (NEURONTIN) 300 MG capsule Take 300 mg by mouth 3 (three) times  daily.  10/30/14   Historical Provider, MD  hydrochlorothiazide (HYDRODIURIL) 12.5 MG tablet Take 12.5 mg by mouth 2 (two) times daily.  04/08/15   Historical Provider, MD  HYDROcodone-acetaminophen (NORCO/VICODIN) 5-325 MG tablet Take 1 tablet by mouth every 4 (four) hours as needed for moderate pain. 10/28/16 10/28/17  Harvest Dark, MD  metoprolol succinate (TOPROL-XL) 50 MG 24 hr tablet Take 50 mg by mouth daily.  12/29/14   Historical Provider, MD  metroNIDAZOLE (FLAGYL) 500 MG tablet Take 1 tablet (500 mg total) by mouth 3 (three) times daily. 10/28/16 11/11/16  Harvest Dark, MD  ondansetron (ZOFRAN ODT) 4 MG disintegrating tablet Take 1 tablet (4 mg total) by mouth every 8 (eight) hours as needed for nausea or vomiting. 10/28/16   Harvest Dark, MD  sertraline (ZOLOFT) 100 MG tablet Take 100 mg by mouth at bedtime.  10/30/14   Historical Provider, MD     VITAL SIGNS:  Blood pressure (!) 157/82, pulse 99, temperature 99.2 F (37.3 C), temperature source Oral, resp. rate 17, height 5\' 11"  (1.803 m), weight 104.8 kg (231 lb), SpO2 97 %.  PHYSICAL EXAMINATION:  Physical Exam  GENERAL:  56 y.o.-year-old patient lying in the bed with no acute distress.  EYES: Pupils equal, round, reactive to light and accommodation. No scleral icterus. Extraocular muscles intact.  HEENT: Head atraumatic, normocephalic. Oropharynx and nasopharynx clear. No oropharyngeal erythema, moist oral mucosa  NECK:  Supple, no jugular venous distention. No thyroid enlargement, no tenderness.  LUNGS: Normal breath sounds bilaterally, no wheezing, rales, rhonchi. No use of accessory muscles of respiration.  CARDIOVASCULAR: S1, S2 normal. No murmurs, rubs, or gallops.  ABDOMEN: Soft, nondistended. Bowel sounds present. No organomegaly or mass. Left lower quadrant tenderness EXTREMITIES: No pedal edema, cyanosis, or clubbing. + 2 pedal & radial pulses b/l.   NEUROLOGIC: Cranial nerves II through XII are intact. No focal Motor  or sensory deficits appreciated b/l PSYCHIATRIC: The patient is alert and oriented x 3. Good affect.  SKIN: No obvious rash, lesion, or ulcer.   LABORATORY PANEL:   CBC  Recent Labs Lab 10/28/16 1236  WBC 7.2  HGB 14.0  HCT 40.3  PLT 137*   ------------------------------------------------------------------------------------------------------------------  Chemistries   Recent Labs Lab 10/28/16 1236  NA 139  K 3.4*  CL 102  CO2 28  GLUCOSE 132*  BUN 8  CREATININE 0.61  CALCIUM 9.3  AST 28  ALT 32  ALKPHOS 71  BILITOT 0.7   ------------------------------------------------------------------------------------------------------------------  Cardiac Enzymes No results for input(s): TROPONINI in the last 168 hours. ------------------------------------------------------------------------------------------------------------------  RADIOLOGY:  Ct Abdomen Pelvis W Contrast  Result Date: 10/28/2016 CLINICAL DATA:  Pain with urination.  No low back pain or fever. EXAM: CT ABDOMEN AND PELVIS WITH CONTRAST TECHNIQUE: Multidetector CT imaging of the abdomen and pelvis was performed using the standard  protocol following bolus administration of intravenous contrast. CONTRAST:  132mL ISOVUE-300 IOPAMIDOL (ISOVUE-300) INJECTION 61% COMPARISON:  None. FINDINGS: Lower chest: Lung bases are clear. Coronary artery atherosclerosis in the LAD. Hepatobiliary: Low attenuation of the liver as can be seen with hepatic steatosis. No focal hepatic mass. Normal gallbladder. Pancreas: Unremarkable. No pancreatic ductal dilatation or surrounding inflammatory changes. Spleen: Mild splenomegaly without focal abnormality. Adrenals/Urinary Tract: Adrenal glands are unremarkable. Kidneys are normal, without renal calculi, focal lesion, or hydronephrosis. Bladder is unremarkable. Stomach/Bowel: No bowel dilatation to suggest obstruction. No pneumatosis, pneumoperitoneum or portal venous gas. Sigmoid diverticulosis  with bowel wall thickening and perisigmoid ule inflammatory changes consistent with acute sigmoid diverticulitis. No focal fluid collection to suggest an abscess. No abdominal or pelvic free fluid. Vascular/Lymphatic: No significant vascular findings are present. No enlarged abdominal or pelvic lymph nodes. Reproductive: Status post hysterectomy. No adnexal masses. Other: No abdominal wall hernia or abnormality. No abdominopelvic ascites. Musculoskeletal: No acute osseous abnormality. No lytic or sclerotic osseous lesion. Bilateral facet arthropathy at L4-5 and L5-S1. IMPRESSION: 1. Acute sigmoid diverticulitis. No focal fluid collection to suggest an abscess. 2. Hepatic steatosis. Electronically Signed   By: Kathreen Devoid   On: 10/28/2016 15:56     IMPRESSION AND PLAN:   * Acute sigmoid diverticulitis Unable to tolerate by mouth medications due to vomiting. Febrile in the emergency room. Admit to medical bed. Start IV ciprofloxacin and Flagyl. Zofran as needed. Clear liquid diet. Had recent colonoscopy which showed sigmoid diverticulosis. Pain medications as needed  * Hypertension Continue home medications  * Sleep apnea CPAP at night  * Possible UTI with dysuria Urine cultures pending. Patient is on ciprofloxacin.  * DVT prophylaxis with Lovenox  All the records are reviewed and case discussed with ED provider. Management plans discussed with the patient, family and they are in agreement.  CODE STATUS: FULL  TOTAL TIME TAKING CARE OF THIS PATIENT: 40 minutes.   Hillary Bow R M.D on 10/28/2016 at 6:35 PM  Between 7am to 6pm - Pager - [phone]  After 6pm go to [email or web address] - password [email or web address] [facility]  Office  [phone]  CC: Primary care physician; Lynnell Jude, MD  Note: This dictation was prepared with Dragon dictation along with smaller phrase technology. Any transcriptional errors that result from this process are unintentional.

## 2016-10-28 NOTE — Progress Notes (Signed)
Assisted patient placing CPAP on.

## 2016-10-28 NOTE — ED Triage Notes (Addendum)
Pt reports pain with urination and frequency since Friday. Pt denies lower back pain or fever.

## 2016-10-28 NOTE — ED Provider Notes (Signed)
[facility] Emergency Department Provider Note  Time seen: 2:18 PM  I have reviewed the triage vital signs and the nursing notes.   HISTORY  Chief Complaint Urinary Frequency    HPI Grace Hester is a 56 y.o. female with a past medical history of gastric reflux, hypertension, kidney stones, presents the emergency department with left lower quadrant abdominal pain and dysuria. According to the patient for the past 4 days she has been experiencing pain in her lower abdomen especially the left lower quadrant. Dates she is having some pain when she urinates. She states nausea with dry heaving also multiple episodes of diarrhea. Denies black or bloody stool. Denies vaginal discharge or bleeding. Describes her pain as aching pain 9/10 in severity.  Past Medical History:  Diagnosis Date  . Abdominal pain   . Anemia   . Anxiety   . Billowing mitral valve   . Complication of anesthesia    pneumonia after anesthesia  . Depression   . GERD (gastroesophageal reflux disease)   . Hypertension   . Kidney stones   . PVC (premature ventricular contraction)   . Sciatica of left side   . Septic shock ([place]) 2015    Patient Active Problem List   Diagnosis Date Noted  . Benign neoplasm of sigmoid colon   . Other diseases of stomach and duodenum   . Noninfectious gastroenteritis, unspecified   . Diarrhea   . Gastritis   . Absolute anemia 02/13/2016  . Anxiety 02/13/2016  . Billowing mitral valve 02/13/2016  . Beat, premature ventricular 02/13/2016  . Diarrhea, functional 01/15/2016  . Benign essential HTN 03/26/2015  . Essential (primary) hypertension 01/10/2015  . Acute respiratory failure with hypoxemia ([place]) 09/20/2014  . Acute cystitis 07/07/2014  . Acid reflux 01/04/2014  . Back ache 02/07/2013  . Calculus of kidney 02/07/2013    Past Surgical History:  Procedure Laterality Date  . ABDOMINAL HYSTERECTOMY    . AUGMENTATION MAMMAPLASTY  2008  .  COLONOSCOPY WITH PROPOFOL N/A 03/14/2016   Procedure: COLONOSCOPY WITH PROPOFOL;  Surgeon: Lucilla Lame, MD;  Location: [place];  Service: Endoscopy;  Laterality: N/A;  . ESOPHAGOGASTRODUODENOSCOPY (EGD) WITH PROPOFOL N/A 03/14/2016   Procedure: ESOPHAGOGASTRODUODENOSCOPY (EGD) WITH PROPOFOL;  Surgeon: Lucilla Lame, MD;  Location: [place];  Service: Endoscopy;  Laterality: N/A;  . FLEXOR TENDON REPAIR Left 08/04/13   Dr. Elvina Mattes, [facility]  . KIDNEY STONE SURGERY  2015  . POLYPECTOMY  03/14/2016   Procedure: POLYPECTOMY;  Surgeon: Lucilla Lame, MD;  Location: [place];  Service: Endoscopy;;  . ROTATOR CUFF REPAIR Left     Prior to Admission medications   Medication Sig Start Date End Date Taking? Authorizing Provider  albuterol (PROVENTIL HFA;VENTOLIN HFA) 108 (90 Base) MCG/ACT inhaler Inhale 1-2 puffs into the lungs every 6 (six) hours as needed for wheezing or shortness of breath. 04/03/16   Lorin Picket, PA-C  ALPRAZolam (XANAX) 0.5 MG tablet TAKE 1 TABLET BY MOUTH 3 TIMES A DAY AS NEEDED FOR ANXIETY 06/18/15   Historical Provider, MD  ARIPiprazole (ABILIFY) 2 MG tablet Take 2 mg by mouth daily.  06/18/15   Historical Provider, MD  esomeprazole (NEXIUM) 20 MG capsule Take 20 mg by mouth daily at 12 noon.     Historical Provider, MD  fexofenadine (ALLEGRA) 180 MG tablet Take 180 mg by mouth daily.     Historical Provider, MD  gabapentin (NEURONTIN) 300 MG capsule Take 300 mg by mouth 3 (three)  times daily.  10/30/14   Historical Provider, MD  hydrochlorothiazide (HYDRODIURIL) 12.5 MG tablet Take 12.5 mg by mouth 2 (two) times daily.  04/08/15   Historical Provider, MD  metoprolol succinate (TOPROL-XL) 50 MG 24 hr tablet Take 50 mg by mouth daily.  12/29/14   Historical Provider, MD  sertraline (ZOLOFT) 100 MG tablet Take 100 mg by mouth at bedtime.  10/30/14   Historical Provider, MD    Allergies  Allergen Reactions  . Latex Other (See Comments)    Burning, raw skin  from underwear elastic  . Tape Itching    Blisters and tears skin    Family History  Problem Relation Age of Onset  . Heart disease Mother   . Stroke Mother   . Heart disease Father     Social History Social History  Substance Use Topics  . Smoking status: Former Smoker    Quit date: 01/27/2000  . Smokeless tobacco: Never Used  . Alcohol use No    Review of Systems Constitutional: Negative for fever. Cardiovascular: Negative for chest pain. Respiratory: Negative for shortness of breath. Gastrointestinal: Lower abdominal pain. Nausea, dry heaving, diarrhea Genitourinary: Mild dysuria Musculoskeletal: Negative for back pain Neurological: Negative for headache 10-point ROS otherwise negative.  ____________________________________________   PHYSICAL EXAM:  VITAL SIGNS: ED Triage Vitals  Enc Vitals Group     BP 10/28/16 1233 (!) 161/81     Pulse Rate 10/28/16 1233 (!) 110     Resp 10/28/16 1233 18     Temp 10/28/16 1233 100.2 F (37.9 C)     Temp Source 10/28/16 1233 Oral     SpO2 10/28/16 1233 96 %     Weight 10/28/16 1230 231 lb (104.8 kg)     Height 10/28/16 1230 5\' 11"  (1.803 m)     Head Circumference --      Peak Flow --      Pain Score 10/28/16 1230 8     Pain Loc --      Pain Edu? --      Excl. in [place]? --     Constitutional: Alert and oriented. Well appearing and in no distress. Eyes: Normal exam ENT   Head: Normocephalic and atraumatic.   Mouth/Throat: Mucous membranes are moist. Cardiovascular: Normal rate, regular rhythm. No murmur Respiratory: Normal respiratory effort without tachypnea nor retractions. Breath sounds are clear  Gastrointestinal: Soft, moderate left lower quadrant abdominal tenderness to palpation with mild suprapubic tenderness, otherwise benign abdomen, no rebound guarding or distention. No CVA tenderness Musculoskeletal: Nontender with normal range of motion in all extremities.  Neurologic:  Normal speech and language. No  gross focal neurologic deficits Skin:  Skin is warm, dry and intact.  Psychiatric: Mood and affect are normal.   ____________________________________________   RADIOLOGY  CT consistent with acute diverticulitis  ____________________________________________   INITIAL IMPRESSION / ASSESSMENT AND PLAN / ED COURSE  Pertinent labs & imaging results that were available during my care of the patient were reviewed by me and considered in my medical decision making (see chart for details).  The patient presents the emergency department with left lower quadrant abdominal pain for the past 4 days. She has a fever of 100.2 in the emergency department with mild tachycardia 110 bpm. Patient has moderate left lower quadrant and mild suprapubic tenderness palpation. Patient's labs are largely within normal limits including normal white blood cell count, negative urinalysis. Given the patient's tenderness to palpation with fever and tachycardia we will obtain a CT  of the abdomen/pelvis, treat with IV fluids morphine and Zofran.  Patient CT is consistent with acute diverticulitis. Uncomplicated. No signs of abscess or perforation. I discussed the results with the patient would prefer to go home. She'll attempt oral medications in the emergency department. As Long as the patient can tolerate oral medication she'll be discharged home with pcp follow-up.  The patient continues with nausea and vomiting. Unable to keep down oral medications. We'll admit to the hospital for further treatment.  ____________________________________________   FINAL CLINICAL IMPRESSION(S) / ED DIAGNOSES  Left lower quadrant abdominal pain acute diverticulitis    Harvest Dark, MD 10/28/16 1806

## 2016-10-29 LAB — URINE CULTURE

## 2016-10-29 LAB — C DIFFICILE QUICK SCREEN W PCR REFLEX
C DIFFICILE (CDIFF) INTERP: NOT DETECTED
C Diff antigen: NEGATIVE
C Diff toxin: NEGATIVE

## 2016-10-29 MED ORDER — HYDROCODONE-ACETAMINOPHEN 5-325 MG PO TABS
1.0000 | ORAL_TABLET | ORAL | Status: DC | PRN
Start: 2016-10-29 — End: 2016-10-31
  Administered 2016-10-29 – 2016-10-31 (×4): 2 via ORAL
  Filled 2016-10-29 (×4): qty 2

## 2016-10-29 NOTE — Progress Notes (Signed)
[place] at [place] NAME: Grace Hester    MR#:  [id number]  DATE OF BIRTH:  1961/01/17  SUBJECTIVE:  CHIEF COMPLAINT:   Chief Complaint  Patient presents with  . Urinary Frequency  Feeling somewhat better. still has some abdominal pain, REVIEW OF SYSTEMS:  Review of Systems  Constitutional: Negative for chills, fever and weight loss.  HENT: Negative for nosebleeds and sore throat.   Eyes: Negative for blurred vision.  Respiratory: Negative for cough, shortness of breath and wheezing.   Cardiovascular: Negative for chest pain, orthopnea, leg swelling and PND.  Gastrointestinal: Positive for abdominal pain. Negative for constipation, diarrhea, heartburn, nausea and vomiting.  Genitourinary: Negative for dysuria and urgency.  Musculoskeletal: Negative for back pain.  Skin: Negative for rash.  Neurological: Negative for dizziness, speech change, focal weakness and headaches.  Endo/Heme/Allergies: Does not bruise/bleed easily.  Psychiatric/Behavioral: Negative for depression.    DRUG ALLERGIES:   Allergies  Allergen Reactions  . Latex Other (See Comments)    Burning, raw skin from underwear elastic  . Tape Itching    Blisters and tears skin   VITALS:  Blood pressure 121/73, pulse 80, temperature 98.3 F (36.8 C), temperature source Oral, resp. rate 18, height 5\' 11"  (1.803 m), weight 104.8 kg (231 lb), SpO2 95 %. PHYSICAL EXAMINATION:  Physical Exam  Constitutional: She is oriented to person, place, and time and well-developed, well-nourished, and in no distress.  HENT:  Head: Normocephalic and atraumatic.  Eyes: Conjunctivae and EOM are normal. Pupils are equal, round, and reactive to light.  Neck: Normal range of motion. Neck supple. No tracheal deviation present. No thyromegaly present.  Cardiovascular: Normal rate, regular rhythm and normal heart sounds.   Pulmonary/Chest: Effort normal and breath sounds normal. No  respiratory distress. She has no wheezes. She exhibits no tenderness.  Abdominal: Soft. Bowel sounds are normal. She exhibits no distension. There is tenderness in the right lower quadrant.  Musculoskeletal: Normal range of motion.  Neurological: She is alert and oriented to person, place, and time. No cranial nerve deficit.  Skin: Skin is warm and dry. No rash noted.  Psychiatric: Mood and affect normal.   LABORATORY PANEL:  Female CBC  Recent Labs Lab 10/28/16 1236  WBC 7.2  HGB 14.0  HCT 40.3  PLT 137*   ------------------------------------------------------------------------------------------------------------------ Chemistries   Recent Labs Lab 10/28/16 1236  NA 139  K 3.4*  CL 102  CO2 28  GLUCOSE 132*  BUN 8  CREATININE 0.61  CALCIUM 9.3  AST 28  ALT 32  ALKPHOS 71  BILITOT 0.7   RADIOLOGY:  No results found. ASSESSMENT AND PLAN:   * Acute sigmoid diverticulitis -Continue IV ciprofloxacin and Flagyl. Zofran as needed.  Tolerated clear liquid diet ->   Advance to soft diet Had recent colonoscopy which showed sigmoid diverticulosis. Pain medications as needed  * Hypertension Continue home medications  * Sleep apnea CPAP at night  * Possible UTI with dysuria Urine cultures pending. continue ciprofloxacin.  *Hypokalemia Replete and recheck-   DVT prophylaxis with Lovenox     All the records are reviewed and case discussed with Care Management/Social Worker. Management plans discussed with the patient, family and they are in agreement.  CODE STATUS: Full Code  TOTAL TIME TAKING CARE OF THIS PATIENT: 35 minutes.   More than 50% of the time was spent in counseling/coordination of care: YES  POSSIBLE D/C IN 1-2 DAYS, DEPENDING ON CLINICAL CONDITION.  Max Sane M.D on 10/29/2016 at 4:59 PM  Between 7am to 6pm - Pager - [phone]  After 6pm go to [email or web address] - Proofreader  [employer] [facility] [place]  Office   [phone]  CC: Primary care physician; Lynnell Jude, MD  Note: This dictation was prepared with Dragon dictation along with smaller phrase technology. Any transcriptional errors that result from this process are unintentional.

## 2016-10-30 LAB — BASIC METABOLIC PANEL
ANION GAP: 5 (ref 5–15)
BUN: 8 mg/dL (ref 6–20)
CHLORIDE: 106 mmol/L (ref 101–111)
CO2: 27 mmol/L (ref 22–32)
CREATININE: 0.5 mg/dL (ref 0.44–1.00)
Calcium: 8.9 mg/dL (ref 8.9–10.3)
GFR calc non Af Amer: >60 mL/min (ref >60)
Glucose, Bld: 119 mg/dL — ABNORMAL HIGH (ref 65–99)
Potassium: 3.4 mmol/L — ABNORMAL LOW (ref 3.5–5.1)
SODIUM: 138 mmol/L (ref 135–145)

## 2016-10-30 LAB — GASTROINTESTINAL PANEL BY PCR, STOOL (REPLACES STOOL CULTURE)
ASTROVIRUS: NOT DETECTED
Adenovirus F40/41: NOT DETECTED
CAMPYLOBACTER SPECIES: NOT DETECTED
CYCLOSPORA CAYETANENSIS: NOT DETECTED
Cryptosporidium: NOT DETECTED
ENTEROTOXIGENIC E COLI (ETEC): NOT DETECTED
Entamoeba histolytica: NOT DETECTED
Enteroaggregative E coli (EAEC): NOT DETECTED
Enteropathogenic E coli (EPEC): NOT DETECTED
Giardia lamblia: NOT DETECTED
NOROVIRUS GI/GII: NOT DETECTED
PLESIMONAS SHIGELLOIDES: NOT DETECTED
Rotavirus A: NOT DETECTED
SALMONELLA SPECIES: NOT DETECTED
SAPOVIRUS (I, II, IV, AND V): NOT DETECTED
SHIGA LIKE TOXIN PRODUCING E COLI (STEC): NOT DETECTED
SHIGELLA/ENTEROINVASIVE E COLI (EIEC): NOT DETECTED
VIBRIO SPECIES: NOT DETECTED
Vibrio cholerae: NOT DETECTED
Yersinia enterocolitica: NOT DETECTED

## 2016-10-30 LAB — CBC
HEMATOCRIT: 35 % (ref 35.0–47.0)
HEMOGLOBIN: 11.8 g/dL — AB (ref 12.0–16.0)
MCH: 29.7 pg (ref 26.0–34.0)
MCHC: 33.7 g/dL (ref 32.0–36.0)
MCV: 88.1 fL (ref 80.0–100.0)
Platelets: 109 10*3/uL — ABNORMAL LOW (ref 150–440)
RBC: 3.97 MIL/uL (ref 3.80–5.20)
RDW: 14.4 % (ref 11.5–14.5)
WBC: 4.7 10*3/uL (ref 3.6–11.0)

## 2016-10-30 MED ORDER — POTASSIUM CHLORIDE CRYS ER 20 MEQ PO TBCR
40.0000 meq | EXTENDED_RELEASE_TABLET | Freq: Once | ORAL | Status: AC
  Administered 2016-10-30: 40 meq via ORAL
  Filled 2016-10-30: qty 2

## 2016-10-30 NOTE — Progress Notes (Signed)
[place] at [place] NAME: Grace Hester    MR#:  [id number]  DATE OF BIRTH:  April 13, 1961  SUBJECTIVE:  CHIEF COMPLAINT:   Chief Complaint  Patient presents with  . Urinary Frequency  still having abd pain, threw up once after soft lunch y'day, none since then REVIEW OF SYSTEMS:  Review of Systems  Constitutional: Negative for chills, fever and weight loss.  HENT: Negative for nosebleeds and sore throat.   Eyes: Negative for blurred vision.  Respiratory: Negative for cough, shortness of breath and wheezing.   Cardiovascular: Negative for chest pain, orthopnea, leg swelling and PND.  Gastrointestinal: Positive for abdominal pain. Negative for constipation, diarrhea, heartburn, nausea and vomiting.  Genitourinary: Negative for dysuria and urgency.  Musculoskeletal: Negative for back pain.  Skin: Negative for rash.  Neurological: Negative for dizziness, speech change, focal weakness and headaches.  Endo/Heme/Allergies: Does not bruise/bleed easily.  Psychiatric/Behavioral: Negative for depression.   DRUG ALLERGIES:   Allergies  Allergen Reactions  . Latex Other (See Comments)    Burning, raw skin from underwear elastic  . Tape Itching    Blisters and tears skin   VITALS:  Blood pressure 131/74, pulse 79, temperature 98.6 F (37 C), temperature source Oral, resp. rate 18, height 5\' 11"  (1.803 m), weight 105.3 kg (232 lb 3.2 oz), SpO2 92 %. PHYSICAL EXAMINATION:  Physical Exam  Constitutional: She is oriented to person, place, and time and well-developed, well-nourished, and in no distress.  HENT:  Head: Normocephalic and atraumatic.  Eyes: Conjunctivae and EOM are normal. Pupils are equal, round, and reactive to light.  Neck: Normal range of motion. Neck supple. No tracheal deviation present. No thyromegaly present.  Cardiovascular: Normal rate, regular rhythm and normal heart sounds.   Pulmonary/Chest: Effort normal and  breath sounds normal. No respiratory distress. She has no wheezes. She exhibits no tenderness.  Abdominal: Soft. Bowel sounds are normal. She exhibits no distension. There is tenderness in the right lower quadrant.  Musculoskeletal: Normal range of motion.  Neurological: She is alert and oriented to person, place, and time. No cranial nerve deficit.  Skin: Skin is warm and dry. No rash noted.  Psychiatric: Mood and affect normal.   LABORATORY PANEL:  Female CBC  Recent Labs Lab 10/30/16 0502  WBC 4.7  HGB 11.8*  HCT 35.0  PLT 109*   ------------------------------------------------------------------------------------------------------------------ Chemistries   Recent Labs Lab 10/28/16 1236 10/30/16 0502  NA 139 138  K 3.4* 3.4*  CL 102 106  CO2 28 27  GLUCOSE 132* 119*  BUN 8 8  CREATININE 0.61 0.50  CALCIUM 9.3 8.9  AST 28  --   ALT 32  --   ALKPHOS 71  --   BILITOT 0.7  --    RADIOLOGY:  No results found. ASSESSMENT AND PLAN:   * Acute sigmoid diverticulitis -Continue IV ciprofloxacin and Flagyl. Zofran as needed.  continue soft diet Had recent colonoscopy which showed sigmoid diverticulosis. Pain medications as needed - if no improvement by tomorrow, will consider GI c/s (Not sure whether they will add much at this point)  * Hypertension Continue home medications  * Sleep apnea CPAP at night  * Possible UTI with dysuria Urine cultures pending. continue ciprofloxacin.  *Hypokalemia Replete and recheck   DVT prophylaxis with Lovenox     All the records are reviewed and case discussed with Care Management/Social Worker. Management plans discussed with the patient, Nursing and they are in agreement.  CODE STATUS: Full Code  TOTAL TIME TAKING CARE OF THIS PATIENT: 35 minutes.   More than 50% of the time was spent in counseling/coordination of care: YES  POSSIBLE D/C IN 1-2 DAYS, DEPENDING ON CLINICAL CONDITION.   Max Sane M.D on 10/30/2016  at 1:04 PM  Between 7am to 6pm - Pager - [phone]  After 6pm go to [email or web address] - Proofreader  [facility] [place] [facility]  Office  [phone]  CC: Primary care physician; Lynnell Jude, MD  Note: This dictation was prepared with Dragon dictation along with smaller phrase technology. Any transcriptional errors that result from this process are unintentional.

## 2016-10-31 MED ORDER — METRONIDAZOLE 500 MG PO TABS: 500.0000 mg | ORAL_TABLET | Freq: Three times a day (TID) | ORAL | 0 refills | Status: AC

## 2016-10-31 MED ORDER — CIPROFLOXACIN HCL 500 MG PO TABS: 500.0000 mg | ORAL_TABLET | Freq: Two times a day (BID) | ORAL | 0 refills | Status: AC

## 2016-10-31 MED ORDER — ONDANSETRON 4 MG PO TBDP: 4.0000 mg | ORAL_TABLET | Freq: Three times a day (TID) | ORAL | 0 refills | Status: DC | PRN

## 2016-10-31 NOTE — Progress Notes (Signed)
Patient discharged to home, Prescription sent electronically to pharmacy patient educated on diverticulitis, diet, importance of completing antibiotics as ordered. IV discontinued, site clean dry and intact. Patient is alert and oriented, taken out to awaiting car in a wheelchair.

## 2016-10-31 NOTE — Discharge Instructions (Signed)

## 2016-11-02 NOTE — Discharge Summary (Signed)
Grace Hester at [place] NAME: Grace Hester    MR#:  [id number]  DATE OF BIRTH:  Dec 23, 1960  DATE OF ADMISSION:  10/28/2016   ADMITTING PHYSICIAN: Grace Bow, MD  DATE OF DISCHARGE: 10/31/2016 11:47 AM  PRIMARY CARE PHYSICIAN: BLISS, Lynnell Jude, MD   ADMISSION DIAGNOSIS:  Diverticulitis of large intestine without perforation or abscess without bleeding [K57.32] DISCHARGE DIAGNOSIS:  Active Problems:   Acute diverticulitis  SECONDARY DIAGNOSIS:   Past Medical History:  Diagnosis Date  . Abdominal pain   . Anemia   . Anxiety   . Asthma   . Billowing mitral valve   . Complication of anesthesia    pneumonia after anesthesia  . Depression   . GERD (gastroesophageal reflux disease)   . Hypertension   . Kidney stones   . PVC (premature ventricular contraction)   . Sciatica of left side   . Septic shock ([place]) 2015   HOSPITAL COURSE:  56 y.o. female with a known history of Hypertension, sleep apnea admitted for complaining of 3 days of left lower quadrant abd pain and dysuria.  * Acute sigmoid diverticulitis -improved with conservative mgmt with cipro & flagyl - slowly advanced diet and she tolerated fine.  * Simple UTI with dysuria Treated with Abx. Urine c/s no growth  *Hypokalemia Repleted DISCHARGE CONDITIONS:  stable CONSULTS OBTAINED:   DRUG ALLERGIES:   Allergies  Allergen Reactions  . Latex Other (See Comments)    Burning, raw skin from underwear elastic  . Tape Itching    Blisters and tears skin   DISCHARGE MEDICATIONS:   Allergies as of 10/31/2016      Reactions   Latex Other (See Comments)   Burning, raw skin from underwear elastic   Tape Itching   Blisters and tears skin      Medication List    TAKE these medications   albuterol 108 (90 Base) MCG/ACT inhaler Commonly known as:  PROVENTIL HFA;VENTOLIN HFA Inhale 1-2 puffs into the lungs every 6 (six) hours as needed for wheezing or shortness of  breath.   ALPRAZolam 0.5 MG tablet Commonly known as:  XANAX TAKE 1 TABLET BY MOUTH 3 TIMES A DAY AS NEEDED FOR ANXIETY   ARIPiprazole 2 MG tablet Commonly known as:  ABILIFY Take 2 mg by mouth daily.   ciprofloxacin 500 MG tablet Commonly known as:  CIPRO Take 1 tablet (500 mg total) by mouth 2 (two) times daily.   esomeprazole 20 MG capsule Commonly known as:  NEXIUM Take 20 mg by mouth daily at 12 noon.   fexofenadine 180 MG tablet Commonly known as:  ALLEGRA Take 180 mg by mouth daily.   gabapentin 300 MG capsule Commonly known as:  NEURONTIN Take 300-900 mg by mouth 3 (three) times daily. Take 1 cap qam. 1 in the afternoon and 3 caps qhs   hydrochlorothiazide 12.5 MG tablet Commonly known as:  HYDRODIURIL Take 12.5 mg by mouth daily.   metoprolol succinate 50 MG 24 hr tablet Commonly known as:  TOPROL-XL Take 50 mg by mouth daily.   metroNIDAZOLE 500 MG tablet Commonly known as:  FLAGYL Take 1 tablet (500 mg total) by mouth 3 (three) times daily.   ondansetron 4 MG disintegrating tablet Commonly known as:  ZOFRAN ODT Take 1 tablet (4 mg total) by mouth every 8 (eight) hours as needed for nausea or vomiting.   sertraline 100 MG tablet Commonly known as:  ZOLOFT Take 200 mg by  mouth at bedtime.      DISCHARGE INSTRUCTIONS:   DIET:  Regular diet DISCHARGE CONDITION:  Good ACTIVITY:  Activity as tolerated OXYGEN:  Home Oxygen: No.  Oxygen Delivery: room air DISCHARGE LOCATION:  home   If you experience worsening of your admission symptoms, develop shortness of breath, life threatening emergency, suicidal or homicidal thoughts you must seek medical attention immediately by calling 911 or calling your MD immediately  if symptoms less severe.  You Must read complete instructions/literature along with all the possible adverse reactions/side effects for all the Medicines you take and that have been prescribed to you. Take any new Medicines after you have  completely understood and accpet all the possible adverse reactions/side effects.   Please note  You were cared for by a hospitalist during your hospital stay. If you have any questions about your discharge medications or the care you received while you were in the hospital after you are discharged, you can call the unit and asked to speak with the hospitalist on call if the hospitalist that took care of you is not available. Once you are discharged, your primary care physician will handle any further medical issues. Please note that NO REFILLS for any discharge medications will be authorized once you are discharged, as it is imperative that you return to your primary care physician (or establish a relationship with a primary care physician if you do not have one) for your aftercare needs so that they can reassess your need for medications and monitor your lab values.    On the day of Discharge:  VITAL SIGNS:  Blood pressure (!) 150/85, pulse 74, temperature 98.5 F (36.9 C), temperature source Oral, resp. rate 20, height 5\' 11"  (1.803 m), weight 104.1 kg (229 lb 9.6 oz), SpO2 94 %. PHYSICAL EXAMINATION:  GENERAL:  56 y.o.-year-old patient lying in the bed with no acute distress.  EYES: Pupils equal, round, reactive to light and accommodation. No scleral icterus. Extraocular muscles intact.  HEENT: Head atraumatic, normocephalic. Oropharynx and nasopharynx clear.  NECK:  Supple, no jugular venous distention. No thyroid enlargement, no tenderness.  LUNGS: Normal breath sounds bilaterally, no wheezing, rales,rhonchi or crepitation. No use of accessory muscles of respiration.  CARDIOVASCULAR: S1, S2 normal. No murmurs, rubs, or gallops.  ABDOMEN: Soft, non-tender, non-distended. Bowel sounds present. No organomegaly or mass.  EXTREMITIES: No pedal edema, cyanosis, or clubbing.  NEUROLOGIC: Cranial nerves II through XII are intact. Muscle strength 5/5 in all extremities. Sensation intact. Gait not  checked.  PSYCHIATRIC: The patient is alert and oriented x 3.  SKIN: No obvious rash, lesion, or ulcer.  DATA REVIEW:   CBC  Recent Labs Lab 10/30/16 0502  WBC 4.7  HGB 11.8*  HCT 35.0  PLT 109*    Chemistries   Recent Labs Lab 10/28/16 1236 10/30/16 0502  NA 139 138  K 3.4* 3.4*  CL 102 106  CO2 28 27  GLUCOSE 132* 119*  BUN 8 8  CREATININE 0.61 0.50  CALCIUM 9.3 8.9  AST 28  --   ALT 32  --   ALKPHOS 71  --   BILITOT 0.7  --     Follow-up Information    BLISS, Lynnell Jude, MD. Go on 11/05/2016.   Specialty:  Family Medicine Why:  @ 9 am as scheduled Contact information: [place] [address] [phone]        Gaylyn Cheers, MD. Go on 12/04/2016.   Specialty:  Gastroenterology Why:  Thursday  at 10:30am for hospital follow-up Contact information: [place] [address] [phone]           Management plans discussed with the patient, family and they are in agreement.  CODE STATUS: Prior   TOTAL TIME TAKING CARE OF THIS PATIENT: 45 minutes.    Max Sane M.D on 11/02/2016 at 1:59 PM  Between 7am to 6pm - Pager - [phone]  After 6pm go to [email or web address] - Proofreader  [facility] [place] [facility]  Office  [phone]  CC: Primary care physician; Lynnell Jude, MD   Note: This dictation was prepared with Dragon dictation along with smaller phrase technology. Any transcriptional errors that result from this process are unintentional.

## 2017-01-13 ENCOUNTER — Ambulatory Visit
Admission: EM | Admit: 2017-01-13 | Discharge: 2017-01-13 | Payer: 59 | Attending: Family Medicine | Admitting: Family Medicine

## 2017-01-13 DIAGNOSIS — Z87891 Personal history of nicotine dependence: Secondary | ICD-10-CM | POA: Diagnosis not present

## 2017-01-13 DIAGNOSIS — I639 Cerebral infarction, unspecified: Secondary | ICD-10-CM

## 2017-01-13 DIAGNOSIS — R42 Dizziness and giddiness: Secondary | ICD-10-CM | POA: Insufficient documentation

## 2017-01-13 DIAGNOSIS — R079 Chest pain, unspecified: Secondary | ICD-10-CM

## 2017-01-13 MED ORDER — ONDANSETRON 8 MG PO TBDP
8.0000 mg | ORAL_TABLET | Freq: Once | ORAL | Status: AC
  Administered 2017-01-13: 8 mg via ORAL

## 2017-01-13 NOTE — ED Triage Notes (Addendum)
Patient states that she has was at work and started having severe dizziness around 9am. Patient states that nurse at work told her to get a coke. Patient states that she went to primary office and doctor could not see her. Patient is very dizzy with positional changes. Patient reports that all day yesterday she had middle to left sided chest pain.

## 2017-01-13 NOTE — ED Provider Notes (Signed)
[facility]    CSN: [id number] Arrival date & time: 01/13/17  1131     History   Chief Complaint Chief Complaint  Patient presents with  . Dizziness  . Chest Pain    HPI Grace Hester is a 56 y.o. female.   Patient is a 56 year old white female who started having some chest pain yesterday. During the day she was having chest pain she also has some dizziness. She woke this morning with dizziness much worse she went to work after getting 5:30 this morning with dizziness and continued to have dizziness. She states she feels that she has to go towards the right. Along with dizziness and feeling of going towards the right she's not felt well she's had nausea. She went to work saw the work nurse who recommended she drinks or issues and go back to work. She was still unable to function at work so because she was unable to function she went to Dr. Eula Flax office who was not in and staff recommend she came here. She drove 185% still somewhat she's leaning towards the right and unable to get her balance and has nausea along with the dizziness. She does not smoke history mitral valve prolapse. About 1-2 years ago she had a kidney stone that had to be blasted by her urologist Dr. Jacqlyn Larsen. She states she wound up becoming septic within the next 48 hours and be admitted to the ICU unit. While in ICU unit something happened to her left shoulder injuring her left shoulder which had to be scoped later. She states since then her health has not been good she's had multiple health issues. Family medical history is pertinent for father having his first heart attack in his 78s and finally succumbing to a massive heart attack in his 41s. She is a former smoker and she is allergic to latex and tape.   The history is provided by the patient. No language interpreter was used.  Dizziness  Quality:  Lightheadedness, head spinning, imbalance and room spinning Onset quality:  Sudden Duration:  2  days Timing:  Constant Progression:  Worsening Chronicity:  New Context: bending over, head movement, physical activity and standing up   Relieved by:  Nothing Worsened by:  Nothing Associated symptoms: chest pain   Risk factors: no heart disease   Chest Pain  Associated symptoms: dizziness     Past Medical History:  Diagnosis Date  . Abdominal pain   . Anemia   . Anxiety   . Asthma   . Billowing mitral valve   . Complication of anesthesia    pneumonia after anesthesia  . Depression   . GERD (gastroesophageal reflux disease)   . Hypertension   . Kidney stones   . PVC (premature ventricular contraction)   . Sciatica of left side   . Septic shock ([place]) 2015    Patient Active Problem List   Diagnosis Date Noted  . Acute diverticulitis 10/28/2016  . Benign neoplasm of sigmoid colon   . Other diseases of stomach and duodenum   . Noninfectious gastroenteritis, unspecified   . Diarrhea   . Gastritis   . Absolute anemia 02/13/2016  . Anxiety 02/13/2016  . Billowing mitral valve 02/13/2016  . Beat, premature ventricular 02/13/2016  . Diarrhea, functional 01/15/2016  . Benign essential HTN 03/26/2015  . Essential (primary) hypertension 01/10/2015  . Acute respiratory failure with hypoxemia ([place]) 09/20/2014  . Acute cystitis 07/07/2014  . Acid reflux 01/04/2014  . Back ache 02/07/2013  .  Calculus of kidney 02/07/2013    Past Surgical History:  Procedure Laterality Date  . ABDOMINAL HYSTERECTOMY    . AUGMENTATION MAMMAPLASTY  2008  . COLONOSCOPY WITH PROPOFOL N/A 03/14/2016   Procedure: COLONOSCOPY WITH PROPOFOL;  Surgeon: Lucilla Lame, MD;  Location: [facility];  Service: Endoscopy;  Laterality: N/A;  . ESOPHAGOGASTRODUODENOSCOPY (EGD) WITH PROPOFOL N/A 03/14/2016   Procedure: ESOPHAGOGASTRODUODENOSCOPY (EGD) WITH PROPOFOL;  Surgeon: Lucilla Lame, MD;  Location: [facility];  Service: Endoscopy;  Laterality: N/A;  . FLEXOR TENDON REPAIR Left 08/04/13    Dr. Elvina Mattes, [facility]  . KIDNEY STONE SURGERY  2015  . POLYPECTOMY  03/14/2016   Procedure: POLYPECTOMY;  Surgeon: Lucilla Lame, MD;  Location: [place];  Service: Endoscopy;;  . ROTATOR CUFF REPAIR Left     OB History    No data available       Home Medications    Prior to Admission medications   Medication Sig Start Date End Date Taking? Authorizing Provider  albuterol (PROVENTIL HFA;VENTOLIN HFA) 108 (90 Base) MCG/ACT inhaler Inhale 1-2 puffs into the lungs every 6 (six) hours as needed for wheezing or shortness of breath. 04/03/16  Yes Lorin Picket, PA-C  ALPRAZolam (XANAX) 0.5 MG tablet TAKE 1 TABLET BY MOUTH 3 TIMES A DAY AS NEEDED FOR ANXIETY 06/18/15  Yes [provider]  ARIPiprazole (ABILIFY) 2 MG tablet Take 2 mg by mouth daily.  06/18/15  Yes [provider]  esomeprazole (NEXIUM) 20 MG capsule Take 20 mg by mouth daily at 12 noon.    Yes [provider]  fexofenadine (ALLEGRA) 180 MG tablet Take 180 mg by mouth daily.    Yes [provider]  gabapentin (NEURONTIN) 300 MG capsule Take 300-900 mg by mouth 3 (three) times daily. Take 1 cap qam. 1 in the afternoon and 3 caps qhs 10/30/14  Yes [provider]  hydrochlorothiazide (HYDRODIURIL) 12.5 MG tablet Take 12.5 mg by mouth daily.  04/08/15  Yes [provider]  metoprolol succinate (TOPROL-XL) 50 MG 24 hr tablet Take 50 mg by mouth daily.  12/29/14  Yes [provider]  ondansetron (ZOFRAN ODT) 4 MG disintegrating tablet Take 1 tablet (4 mg total) by mouth every 8 (eight) hours as needed for nausea or vomiting. 10/31/16  Yes Max Sane, MD  sertraline (ZOLOFT) 100 MG tablet Take 200 mg by mouth at bedtime.  10/30/14  Yes [provider]    Family History Family History  Problem Relation Age of Onset  . Heart disease Mother   . Stroke Mother   . Heart disease Father     Social History Social History  Substance Use Topics  . Smoking status:  Former Smoker    Quit date: 01/27/2000  . Smokeless tobacco: Never Used  . Alcohol use No     Allergies   Latex and Tape   Review of Systems Review of Systems  Cardiovascular: Positive for chest pain.  Neurological: Positive for dizziness.     Physical Exam Triage Vital Signs ED Triage Vitals [01/13/17 1147]  Enc Vitals Group     BP (!) 176/97     Pulse Rate 88     Resp 18     Temp      Temp src      SpO2 98 %     Weight      Height      Head Circumference      Peak Flow  Pain Score      Pain Loc      Pain Edu?      Excl. in [place]?    No data found.   Updated Vital Signs BP (!) 176/97 (BP Location: Left Arm)   Pulse 88   Resp 18   SpO2 98%   Visual Acuity Right Eye Distance:   Left Eye Distance:   Bilateral Distance:    Right Eye Near:   Left Eye Near:    Bilateral Near:     Physical Exam  Constitutional: She is oriented to person, place, and time. She appears well-developed and well-nourished.  HENT:  Head: Normocephalic and atraumatic.  Right Ear: External ear normal.  Left Ear: External ear normal.  Mouth/Throat: Oropharynx is clear and moist.  Eyes: Pupils are equal, round, and reactive to light.  Neck: Normal range of motion.  Cardiovascular: Normal rate, regular rhythm and normal heart sounds.   Pulmonary/Chest: Effort normal.  Musculoskeletal: Normal range of motion.  Neurological: She is alert and oriented to person, place, and time.  Patient has some questionable right-sided facial weakness but she densely has some right upper extremities weakness compared to the left. She is right-handed. She's never had right-sided weakness before  Skin: Skin is warm.  Psychiatric: She has a normal mood and affect.  Vitals reviewed.    UC Treatments / Results  Labs (all labs ordered are listed, but only abnormal results are displayed) Labs Reviewed - No data to display  EKG  EKG Interpretation None        ED ECG REPORT I, Keymari Sato  H, the attending physician, personally viewed and interpreted this ECG.   Date: 01/13/2017  EKG Time: 11:49:56  Rate:78  Rhythm: there are no previous tracings available for comparison, normal sinus rhythm  Axis: 17  Intervals:none  ST&T Change: n nonspecific ST changes cannot rule out anterior infarct  Radiology No results found.  Procedures Procedures (including critical care time)  Medications Ordered in UC Medications  ondansetron (ZOFRAN-ODT) disintegrating tablet 8 mg (not administered)     Initial Impression / Assessment and Plan / UC Course  I have reviewed the triage vital signs and the nursing notes.  Pertinent labs & imaging results that were available during my care of the patient were reviewed by me and considered in my medical decision making (see chart for details).   patient will be transferred to [facility] ED for possible stroke. Discussed case with charge nurse Nira Conn at [facility] ED and advised pending arrival. Will give a dose of Zofran while here at the urgent care to treat the nausea    Final Clinical Impressions(s) / UC Diagnoses   Final diagnoses:  Cerebrovascular accident (CVA), unspecified mechanism ([place])  Dizziness  Chest pain at rest    New Prescriptions New Prescriptions   No medications on file     Frederich Cha, MD 01/13/17 1312

## 2017-01-27 DIAGNOSIS — R195 Other fecal abnormalities: Secondary | ICD-10-CM | POA: Insufficient documentation

## 2017-01-27 DIAGNOSIS — K5732 Diverticulitis of large intestine without perforation or abscess without bleeding: Secondary | ICD-10-CM

## 2017-01-27 HISTORY — DX: Diverticulitis of large intestine without perforation or abscess without bleeding: K57.32

## 2017-06-02 ENCOUNTER — Other Ambulatory Visit: Payer: Self-pay | Admitting: Family Medicine

## 2017-06-02 DIAGNOSIS — K760 Fatty (change of) liver, not elsewhere classified: Secondary | ICD-10-CM

## 2017-06-05 ENCOUNTER — Ambulatory Visit
Admission: RE | Admit: 2017-06-05 | Discharge: 2017-06-05 | Disposition: A | Discharge status: Home / Self Care | Payer: 59 | Source: Ambulatory Visit | Attending: Family Medicine | Admitting: Family Medicine

## 2017-06-05 DIAGNOSIS — K760 Fatty (change of) liver, not elsewhere classified: Secondary | ICD-10-CM | POA: Diagnosis present

## 2017-06-10 ENCOUNTER — Emergency Department: Payer: 59

## 2017-06-10 ENCOUNTER — Emergency Department
Admission: EM | Admit: 2017-06-10 | Discharge: 2017-06-10 | Disposition: A | Discharge status: Home / Self Care | Payer: 59 | Attending: Emergency Medicine | Admitting: Emergency Medicine

## 2017-06-10 ENCOUNTER — Encounter: Payer: Self-pay | Admitting: Emergency Medicine

## 2017-06-10 DIAGNOSIS — Z79899 Other long term (current) drug therapy: Secondary | ICD-10-CM | POA: Diagnosis not present

## 2017-06-10 DIAGNOSIS — Z87891 Personal history of nicotine dependence: Secondary | ICD-10-CM | POA: Insufficient documentation

## 2017-06-10 DIAGNOSIS — K5732 Diverticulitis of large intestine without perforation or abscess without bleeding: Secondary | ICD-10-CM

## 2017-06-10 DIAGNOSIS — Z91040 Latex allergy status: Secondary | ICD-10-CM | POA: Insufficient documentation

## 2017-06-10 DIAGNOSIS — J45909 Unspecified asthma, uncomplicated: Secondary | ICD-10-CM | POA: Diagnosis not present

## 2017-06-10 DIAGNOSIS — I10 Essential (primary) hypertension: Secondary | ICD-10-CM | POA: Diagnosis not present

## 2017-06-10 DIAGNOSIS — R109 Unspecified abdominal pain: Secondary | ICD-10-CM | POA: Diagnosis present

## 2017-06-10 LAB — COMPREHENSIVE METABOLIC PANEL
ALBUMIN: 4.4 g/dL (ref 3.5–5.0)
ALK PHOS: 84 U/L (ref 38–126)
ALT: 38 U/L (ref 14–54)
AST: 36 U/L (ref 15–41)
Anion gap: 11 (ref 5–15)
BILIRUBIN TOTAL: 0.6 mg/dL (ref 0.3–1.2)
BUN: 9 mg/dL (ref 6–20)
CHLORIDE: 102 mmol/L (ref 101–111)
CO2: 27 mmol/L (ref 22–32)
CREATININE: 0.69 mg/dL (ref 0.44–1.00)
Calcium: 9.4 mg/dL (ref 8.9–10.3)
GFR calc Af Amer: >60 mL/min (ref >60)
GLUCOSE: 147 mg/dL — AB (ref 65–99)
Potassium: 3.6 mmol/L (ref 3.5–5.1)
Sodium: 140 mmol/L (ref 135–145)
Total Protein: 7.1 g/dL (ref 6.5–8.1)

## 2017-06-10 LAB — URINALYSIS, COMPLETE (UACMP) WITH MICROSCOPIC
Bilirubin Urine: NEGATIVE
Glucose, UA: NEGATIVE mg/dL
Hgb urine dipstick: NEGATIVE
KETONES UR: NEGATIVE mg/dL
Leukocytes, UA: NEGATIVE
Nitrite: NEGATIVE
PROTEIN: NEGATIVE mg/dL
Specific Gravity, Urine: 1.014 (ref 1.005–1.030)
pH: 7 (ref 5.0–8.0)

## 2017-06-10 LAB — CBC
HEMATOCRIT: 39.5 % (ref 35.0–47.0)
Hemoglobin: 14 g/dL (ref 12.0–16.0)
MCH: 31 pg (ref 26.0–34.0)
MCHC: 35.5 g/dL (ref 32.0–36.0)
MCV: 87.4 fL (ref 80.0–100.0)
Platelets: 141 10*3/uL — ABNORMAL LOW (ref 150–440)
RBC: 4.52 MIL/uL (ref 3.80–5.20)
RDW: 14.4 % (ref 11.5–14.5)
WBC: 8.6 10*3/uL (ref 3.6–11.0)

## 2017-06-10 LAB — LACTIC ACID, PLASMA: Lactic Acid, Venous: 2.5 mmol/L (ref 0.5–1.9)

## 2017-06-10 LAB — LIPASE, BLOOD: Lipase: 42 U/L (ref 11–51)

## 2017-06-10 MED ORDER — KETOROLAC TROMETHAMINE 30 MG/ML IJ SOLN
15.0000 mg | Freq: Once | INTRAMUSCULAR | Status: AC
  Administered 2017-06-10: 15 mg via INTRAVENOUS

## 2017-06-10 MED ORDER — METRONIDAZOLE 500 MG PO TABS: 500.0000 mg | ORAL_TABLET | Freq: Three times a day (TID) | ORAL | 0 refills | Status: AC

## 2017-06-10 MED ORDER — OXYCODONE-ACETAMINOPHEN 5-325 MG PO TABS: 1.0000 | ORAL_TABLET | Freq: Four times a day (QID) | ORAL | 0 refills | Status: DC | PRN

## 2017-06-10 MED ORDER — METRONIDAZOLE 500 MG PO TABS
500.0000 mg | ORAL_TABLET | Freq: Once | ORAL | Status: AC
  Administered 2017-06-10: 500 mg via ORAL
  Filled 2017-06-10: qty 1

## 2017-06-10 MED ORDER — ONDANSETRON HCL 4 MG/2ML IJ SOLN
4.0000 mg | Freq: Once | INTRAMUSCULAR | Status: AC
  Administered 2017-06-10: 4 mg via INTRAVENOUS

## 2017-06-10 MED ORDER — MORPHINE SULFATE (PF) 4 MG/ML IV SOLN
INTRAVENOUS | Status: AC
  Administered 2017-06-10: 6 mg via INTRAVENOUS
  Filled 2017-06-10: qty 1

## 2017-06-10 MED ORDER — IOPAMIDOL (ISOVUE-300) INJECTION 61%
100.0000 mL | Freq: Once | INTRAVENOUS | Status: AC | PRN
  Administered 2017-06-10: 100 mL via INTRAVENOUS
  Filled 2017-06-10: qty 100

## 2017-06-10 MED ORDER — KETOROLAC TROMETHAMINE 30 MG/ML IJ SOLN
INTRAMUSCULAR | Status: AC
  Administered 2017-06-10: 15 mg via INTRAVENOUS
  Filled 2017-06-10: qty 1

## 2017-06-10 MED ORDER — DEXTROSE 5 % IV SOLN
1.0000 g | Freq: Once | INTRAVENOUS | Status: AC
  Administered 2017-06-10: 1 g via INTRAVENOUS

## 2017-06-10 MED ORDER — SODIUM CHLORIDE 0.9 % IV BOLUS (SEPSIS)
1000.0000 mL | Freq: Once | INTRAVENOUS | Status: AC
  Administered 2017-06-10: 1000 mL via INTRAVENOUS

## 2017-06-10 MED ORDER — CEFTRIAXONE SODIUM IN DEXTROSE 20 MG/ML IV SOLN
INTRAVENOUS | Status: AC
  Filled 2017-06-10: qty 50

## 2017-06-10 MED ORDER — MORPHINE SULFATE (PF) 4 MG/ML IV SOLN
6.0000 mg | Freq: Once | INTRAVENOUS | Status: AC
  Administered 2017-06-10: 6 mg via INTRAVENOUS

## 2017-06-10 MED ORDER — ONDANSETRON HCL 4 MG/2ML IJ SOLN
INTRAMUSCULAR | Status: AC
  Administered 2017-06-10: 4 mg via INTRAVENOUS
  Filled 2017-06-10: qty 2

## 2017-06-10 MED ORDER — MORPHINE SULFATE (PF) 4 MG/ML IV SOLN
INTRAVENOUS | Status: AC
  Filled 2017-06-10: qty 1

## 2017-06-10 MED ORDER — AMOXICILLIN-POT CLAVULANATE 875-125 MG PO TABS: 1.0000 | ORAL_TABLET | Freq: Two times a day (BID) | ORAL | 0 refills | Status: AC

## 2017-06-10 NOTE — ED Notes (Signed)
MD aware of lactic acid 2.5 per lab

## 2017-06-10 NOTE — Discharge Instructions (Addendum)
Please take both of your antibiotics as prescribed and make an appointment to follow-up with the general surgeon within the next week for reevaluation. Return to the emergency department for any new or worsening symptoms such as fevers, chills, worsening pain, if you cannot eat or drink, or for any other concerns whatsoever.  It was a pleasure to take care of you today, and thank you for coming to our emergency department.  If you have any questions or concerns before leaving please ask the nurse to grab me and I'm more than happy to go through your aftercare instructions again.  If you were prescribed any opioid pain medication today such as Norco, Vicodin, Percocet, morphine, hydrocodone, or oxycodone please make sure you do not drive when you are taking this medication as it can alter your ability to drive safely.  If you have any concerns once you are home that you are not improving or are in fact getting worse before you can make it to your follow-up appointment, please do not hesitate to call 911 and come back for further evaluation.  Darel Hong, MD  Results for orders placed or performed during the hospital encounter of 06/10/17  Lipase, blood  Result Value Ref Range   Lipase 42 11 - 51 U/L  Comprehensive metabolic panel  Result Value Ref Range   Sodium 140 135 - 145 mmol/L   Potassium 3.6 3.5 - 5.1 mmol/L   Chloride 102 101 - 111 mmol/L   CO2 27 22 - 32 mmol/L   Glucose, Bld 147 (H) 65 - 99 mg/dL   BUN 9 6 - 20 mg/dL   Creatinine, Ser 0.69 0.44 - 1.00 mg/dL   Calcium 9.4 8.9 - 10.3 mg/dL   Total Protein 7.1 6.5 - 8.1 g/dL   Albumin 4.4 3.5 - 5.0 g/dL   AST 36 15 - 41 U/L   ALT 38 14 - 54 U/L   Alkaline Phosphatase 84 38 - 126 U/L   Total Bilirubin 0.6 0.3 - 1.2 mg/dL   GFR calc non Af Amer >60 >60 mL/min   GFR calc Af Amer >60 >60 mL/min   Anion gap 11 5 - 15  CBC  Result Value Ref Range   WBC 8.6 3.6 - 11.0 K/uL   RBC 4.52 3.80 - 5.20 MIL/uL   Hemoglobin 14.0 12.0 -  16.0 g/dL   HCT 39.5 35.0 - 47.0 %   MCV 87.4 80.0 - 100.0 fL   MCH 31.0 26.0 - 34.0 pg   MCHC 35.5 32.0 - 36.0 g/dL   RDW 14.4 11.5 - 14.5 %   Platelets 141 (L) 150 - 440 K/uL  Urinalysis, Complete w Microscopic  Result Value Ref Range   Color, Urine YELLOW (A) YELLOW   APPearance CLEAR (A) CLEAR   Specific Gravity, Urine 1.014 1.005 - 1.030   pH 7.0 5.0 - 8.0   Glucose, UA NEGATIVE NEGATIVE mg/dL   Hgb urine dipstick NEGATIVE NEGATIVE   Bilirubin Urine NEGATIVE NEGATIVE   Ketones, ur NEGATIVE NEGATIVE mg/dL   Protein, ur NEGATIVE NEGATIVE mg/dL   Nitrite NEGATIVE NEGATIVE   Leukocytes, UA NEGATIVE NEGATIVE   RBC / HPF 0-5 0 - 5 RBC/hpf   WBC, UA 0-5 0 - 5 WBC/hpf   Bacteria, UA MANY (A) NONE SEEN   Squamous Epithelial / LPF 0-5 (A) NONE SEEN   Mucus PRESENT   Lactic acid, plasma  Result Value Ref Range   Lactic Acid, Venous 2.5 (HH) 0.5 - 1.9 mmol/L  Ct Abdomen Pelvis W Contrast  Result Date: 06/10/2017 CLINICAL DATA:  56 y/o F; lower abdominal pain and diarrhea. History of diverticulitis. EXAM: CT ABDOMEN AND PELVIS WITH CONTRAST TECHNIQUE: Multidetector CT imaging of the abdomen and pelvis was performed using the standard protocol following bolus administration of intravenous contrast. CONTRAST:  1103mL ISOVUE-300 IOPAMIDOL (ISOVUE-300) INJECTION 61% COMPARISON:  10/28/2016 CT abdomen and pelvis. FINDINGS: Lower chest: Breast prostheses. Moderate coronary artery calcification. Small hiatal hernia. Hepatobiliary: Hepatic steatosis. No focal liver lesion identified. Normal gallbladder. No intra or extrahepatic biliary ductal dilatation. Pancreas: Unremarkable. No pancreatic ductal dilatation or surrounding inflammatory changes. Spleen: Stable mild splenomegaly. Adrenals/Urinary Tract: Adrenal glands are unremarkable. Kidneys are normal, without renal calculi, focal lesion, or hydronephrosis. Bladder is unremarkable. Stomach/Bowel: Extensive sigmoid diverticulosis. Acute inflammation  of sigmoid colon and pericolonic fat in the pelvis compatible with acute diverticulitis. No perforation or abscess at this time. No obstructive or inflammatory changes of small bowel or the stomach. Vascular/Lymphatic: Aortic atherosclerosis. No enlarged abdominal or pelvic lymph nodes. Reproductive: Status post hysterectomy. No adnexal masses. Other: No abdominal wall hernia or abnormality. No abdominopelvic ascites. Musculoskeletal: No fracture is seen. Mild lumbar spine levocurvature. IMPRESSION: 1. Acute sigmoid diverticulitis. No perforation or abscess identified at this time. 2. Hepatic steatosis. 3. Moderate coronary artery and aortic calcific atherosclerosis. 4. Small hiatal hernia. Electronically Signed   By: Kristine Garbe M.D.   On: 06/10/2017 22:42   US Abdomen Limited Ruq  Result Date: 06/05/2017 CLINICAL DATA:  Fatty liver. EXAM: ULTRASOUND ABDOMEN LIMITED RIGHT UPPER QUADRANT COMPARISON:  CT of the abdomen and pelvis 10/28/2016 FINDINGS: Gallbladder: Gallbladder has a normal appearance. Gallbladder wall is 0.8 mm, within normal limits. No stones or pericholecystic fluid. No sonographic Murphy's sign. Common bile duct: Diameter: 2.3 mm Liver: The liver is echogenic. There is attenuation of the ultrasound wave, poor visualization of the internal hepatic architecture, and loss of definition of the diaphragm. No focal liver lesions are identified. Portal vein is patent on color Doppler imaging with normal direction of blood flow towards the liver. IMPRESSION: 1. Hepatic steatosis. 2. No focal liver lesion or evidence for acute cholecystitis. Electronically Signed   By: Nolon Nations M.D.   On: 06/05/2017 11:14

## 2017-06-10 NOTE — ED Triage Notes (Signed)
Patient ambulatory to triage with steady gait, without difficulty or distress noted; pt reports right lower abd pain accomp by diarrhea x 2 days; st hx of same with dx diverticulitis

## 2017-06-10 NOTE — ED Notes (Signed)
Patient discharged to home per MD order. Patient in stable condition, and deemed medically cleared by ED provider for discharge. Discharge instructions reviewed with patient/family using "Teach Back"; verbalized understanding of medication education and administration, and information about follow-up care. Denies further concerns. ° °

## 2017-06-10 NOTE — ED Provider Notes (Signed)
[facility] Emergency Department Provider Note  ____________________________________________   First MD Initiated Contact with Patient 06/10/17 2139     (approximate)  I have reviewed the triage vital signs and the nursing notes.   HISTORY  Chief Complaint Abdominal Pain   HPI Grace Hester is a 55 y.o. female who self presents to the emergency Department with roughly 3 days of severe lower abdominal pain. The pain is intermittent and comes in waves. It is associated with diarrhea and nausea. No vomiting. No fevers or chills. She says that she has had 2 episodes of diverticulitis in the past 7 months and this feels identical. She has a remote abdominal surgical history of hysterectomy. She was recommended to have a partial colectomy in the past however she declined. Nothing in particular seems to make her pain better or worse. Her pain is severe and nonradiating.   Past Medical History:  Diagnosis Date  . Abdominal pain   . Anemia   . Anxiety   . Asthma   . Billowing mitral valve   . Complication of anesthesia    pneumonia after anesthesia  . Depression   . GERD (gastroesophageal reflux disease)   . Hypertension   . Kidney stones   . PVC (premature ventricular contraction)   . Sciatica of left side   . Septic shock ([place]) 2015    Patient Active Problem List   Diagnosis Date Noted  . Acute diverticulitis 10/28/2016  . Benign neoplasm of sigmoid colon   . Other diseases of stomach and duodenum   . Noninfectious gastroenteritis, unspecified   . Diarrhea   . Gastritis   . Absolute anemia 02/13/2016  . Anxiety 02/13/2016  . Billowing mitral valve 02/13/2016  . Beat, premature ventricular 02/13/2016  . Diarrhea, functional 01/15/2016  . Benign essential HTN 03/26/2015  . Essential (primary) hypertension 01/10/2015  . Acute respiratory failure with hypoxemia ([place]) 09/20/2014  . Acute cystitis 07/07/2014  . Acid reflux 01/04/2014  . Back  ache 02/07/2013  . Calculus of kidney 02/07/2013    Past Surgical History:  Procedure Laterality Date  . ABDOMINAL HYSTERECTOMY    . AUGMENTATION MAMMAPLASTY  2008  . COLONOSCOPY WITH PROPOFOL N/A 03/14/2016   Procedure: COLONOSCOPY WITH PROPOFOL;  Surgeon: Lucilla Lame, MD;  Location: [place];  Service: Endoscopy;  Laterality: N/A;  . ESOPHAGOGASTRODUODENOSCOPY (EGD) WITH PROPOFOL N/A 03/14/2016   Procedure: ESOPHAGOGASTRODUODENOSCOPY (EGD) WITH PROPOFOL;  Surgeon: Lucilla Lame, MD;  Location: [place];  Service: Endoscopy;  Laterality: N/A;  . FLEXOR TENDON REPAIR Left 08/04/13   Dr. Elvina Mattes, [facility]  . KIDNEY STONE SURGERY  2015  . POLYPECTOMY  03/14/2016   Procedure: POLYPECTOMY;  Surgeon: Lucilla Lame, MD;  Location: [place];  Service: Endoscopy;;  . ROTATOR CUFF REPAIR Left     Prior to Admission medications   Medication Sig Start Date End Date Taking? Authorizing Provider  albuterol (PROVENTIL HFA;VENTOLIN HFA) 108 (90 Base) MCG/ACT inhaler Inhale 1-2 puffs into the lungs every 6 (six) hours as needed for wheezing or shortness of breath. 04/03/16   Lorin Picket, PA-C  ALPRAZolam Duanne Moron) 0.5 MG tablet TAKE 1 TABLET BY MOUTH 3 TIMES A DAY AS NEEDED FOR ANXIETY 06/18/15   [provider]  amoxicillin-clavulanate (AUGMENTIN) 875-125 MG tablet Take 1 tablet by mouth 2 (two) times daily. 06/10/17 06/24/17  Darel Hong, MD  ARIPiprazole (ABILIFY) 2 MG tablet Take 2 mg by mouth daily.  06/18/15   [provider]  esomeprazole (NEXIUM) 20 MG capsule Take 20 mg by mouth daily at 12 noon.     [provider]  fexofenadine (ALLEGRA) 180 MG tablet Take 180 mg by mouth daily.     [provider]  gabapentin (NEURONTIN) 300 MG capsule Take 300-900 mg by mouth 3 (three) times daily. Take 1 cap qam. 1 in the afternoon and 3 caps qhs 10/30/14   [provider]  hydrochlorothiazide (HYDRODIURIL) 12.5 MG tablet Take 12.5 mg  by mouth daily.  04/08/15   [provider]  metoprolol succinate (TOPROL-XL) 50 MG 24 hr tablet Take 50 mg by mouth daily.  12/29/14   [provider]  metroNIDAZOLE (FLAGYL) 500 MG tablet Take 1 tablet (500 mg total) by mouth 3 (three) times daily. 06/10/17 06/24/17  Darel Hong, MD  ondansetron (ZOFRAN ODT) 4 MG disintegrating tablet Take 1 tablet (4 mg total) by mouth every 8 (eight) hours as needed for nausea or vomiting. 10/31/16   Max Sane, MD  oxyCODONE-acetaminophen (ROXICET) 5-325 MG tablet Take 1 tablet by mouth every 6 (six) hours as needed for severe pain. 06/10/17   Darel Hong, MD  sertraline (ZOLOFT) 100 MG tablet Take 200 mg by mouth at bedtime.  10/30/14   [provider]    Allergies Latex and Tape  Family History  Problem Relation Age of Onset  . Heart disease Mother   . Stroke Mother   . Heart disease Father     Social History Social History  Substance Use Topics  . Smoking status: Former Smoker    Quit date: 01/27/2000  . Smokeless tobacco: Never Used  . Alcohol use No    Review of Systems Constitutional: No fever/chills Eyes: No visual changes. ENT: No sore throat. Cardiovascular: Denies chest pain. Respiratory: Denies shortness of breath. Gastrointestinal: positive for abdominal pain.  Positive nausea, no vomiting.  positive for diarrhea.  No constipation. Genitourinary: Negative for dysuria. Musculoskeletal: Negative for back pain. Skin: Negative for rash. Neurological: Negative for headaches, focal weakness or numbness.   ____________________________________________   PHYSICAL EXAM:  VITAL SIGNS: ED Triage Vitals  Enc Vitals Group     BP 06/10/17 2008 (!) 157/97     Pulse Rate 06/10/17 2008 (!) 108     Resp 06/10/17 2008 16     Temp 06/10/17 2008 99 F (37.2 C)     Temp Source 06/10/17 2008 Oral     SpO2 06/10/17 2008 97 %     Weight 06/10/17 2008 233 lb (105.7 kg)     Height 06/10/17 2008 5\' 11"  (1.803 m)       Head Circumference --      Peak Flow --      Pain Score 06/10/17 2010 8     Pain Loc --      Pain Edu? --      Excl. in [place]? --     Constitutional: alert and oriented 4 appears uncomfortable nontoxic no diaphoresis speaks in full clear sentences Eyes: PERRL EOMI. Head: Atraumatic. Nose: No congestion/rhinnorhea. Mouth/Throat: No trismus Neck: No stridor.   Cardiovascular: tachycardic rate, regular rhythm. Grossly normal heart sounds.  Good peripheral circulation. Respiratory: Normal respiratory effort.  No retractions. Lungs CTAB and moving good air Gastrointestinal: soft nontender distended quite tender right lower quadrant with rebound and guarding negative Rovsing's no frank peritonitis Musculoskeletal: No lower extremity edema   Neurologic:  Normal speech and language. No gross focal neurologic deficits are appreciated. Skin:  Skin is warm, dry and  intact. No rash noted. Psychiatric: Mood and affect are normal. Speech and behavior are normal.    ____________________________________________   DIFFERENTIAL includes but not limited to  appendicitis, diverticulitis, abscess, perforation, fistula ____________________________________________   LABS (all labs ordered are listed, but only abnormal results are displayed)  Labs Reviewed  COMPREHENSIVE METABOLIC PANEL - Abnormal; Notable for the following:       Result Value   Glucose, Bld 147 (*)    All other components within normal limits  CBC - Abnormal; Notable for the following:    Platelets 141 (*)    All other components within normal limits  URINALYSIS, COMPLETE (UACMP) WITH MICROSCOPIC - Abnormal; Notable for the following:    Color, Urine YELLOW (*)    APPearance CLEAR (*)    Bacteria, UA MANY (*)    Squamous Epithelial / LPF 0-5 (*)    All other components within normal limits  LACTIC ACID, PLASMA - Abnormal; Notable for the following:    Lactic Acid, Venous 2.5 (*)    All other components within normal  limits  CULTURE, BLOOD (ROUTINE X 2)  CULTURE, BLOOD (ROUTINE X 2)  LIPASE, BLOOD  LACTIC ACID, PLASMA    blood work reviewed and interpreted by me shows normal white count however slightly elevated lactic acid which is likely secondary to under resuscitation and not true sepsis __________________________________________  EKG   ____________________________________________  RADIOLOGY  CT scan of the abdomen and pelvis reviewed by me shows uncomplicated sigmoid diverticulitis ____________________________________________   PROCEDURES  Procedure(s) performed: no  Procedures  Critical Care performed: no  Observation: no ____________________________________________   INITIAL IMPRESSION / ASSESSMENT AND PLAN / ED COURSE  Pertinent labs & imaging results that were available during my care of the patient were reviewed by me and considered in my medical decision making (see chart for details).       ----------------------------------------- 9:45 PM on 06/10/2017 -----------------------------------------  The patient appears quite uncomfortable with significant right lower quadrant pain with tenderness. She does have a history of multiple episodes of diverticulitis this year alone and was reportedly supposed to have a colectomy however she refused. At this point she is more than simple diverticulitis and she will require IV antibiotics and a CT scan with IV contrast. ____________________________________________  ----------------------------------------- 10:51 PM on 06/10/2017 -----------------------------------------  The patient's CT scan is back showing acute sigmoid diverticulitis, however without perforation or abscess. I discussed the case with on-call general surgeon Dr. Burt Knack who indicated that as the patient is afebrile without a white count and we are adequately able to control her pain she should be stable for outpatient management. He does recommend oral  antibiotics and that as this is the third episode in the last 7 months the patient will likely require a partial colectomy once or active infection has cooled down.  The patient's pain is adequately controlled. She is able to eat and drink. At this point she is stable for outpatient management with oral antibiotics, oral analgesia, and strict return precautions. The patient and her husband verbalize understanding and agreement with the plan.  FINAL CLINICAL IMPRESSION(S) / ED DIAGNOSES  Final diagnoses:  Sigmoid diverticulitis      NEW MEDICATIONS STARTED DURING THIS VISIT:  New Prescriptions   AMOXICILLIN-CLAVULANATE (AUGMENTIN) 875-125 MG TABLET    Take 1 tablet by mouth 2 (two) times daily.   METRONIDAZOLE (FLAGYL) 500 MG TABLET    Take 1 tablet (500 mg total) by mouth 3 (three) times daily.   OXYCODONE-ACETAMINOPHEN (  ROXICET) 5-325 MG TABLET    Take 1 tablet by mouth every 6 (six) hours as needed for severe pain.     Note:  This document was prepared using Dragon voice recognition software and may include unintentional dictation errors.     Darel Hong, MD 06/10/17 2314

## 2017-06-15 LAB — CULTURE, BLOOD (ROUTINE X 2)
Culture: NO GROWTH
Culture: NO GROWTH
SPECIAL REQUESTS: ADEQUATE
Special Requests: ADEQUATE

## 2017-06-16 ENCOUNTER — Other Ambulatory Visit: Payer: Self-pay

## 2017-06-16 DIAGNOSIS — R002 Palpitations: Secondary | ICD-10-CM | POA: Insufficient documentation

## 2017-06-18 ENCOUNTER — Other Ambulatory Visit
Admission: RE | Admit: 2017-06-18 | Discharge: 2017-06-18 | Disposition: A | Discharge status: Home / Self Care | Payer: 59 | Source: Ambulatory Visit | Attending: Surgery | Admitting: Surgery

## 2017-06-18 ENCOUNTER — Encounter: Payer: Self-pay | Admitting: Surgery

## 2017-06-18 ENCOUNTER — Ambulatory Visit (INDEPENDENT_AMBULATORY_CARE_PROVIDER_SITE_OTHER): Payer: 59 | Admitting: Surgery

## 2017-06-18 VITALS — BP 166/96 | HR 105 | Temp 98.5°F | Ht 71.0 in | Wt 236.2 lb

## 2017-06-18 DIAGNOSIS — K5732 Diverticulitis of large intestine without perforation or abscess without bleeding: Secondary | ICD-10-CM

## 2017-06-18 DIAGNOSIS — R197 Diarrhea, unspecified: Secondary | ICD-10-CM | POA: Diagnosis not present

## 2017-06-18 DIAGNOSIS — R1030 Lower abdominal pain, unspecified: Secondary | ICD-10-CM

## 2017-06-18 LAB — CBC WITH DIFFERENTIAL/PLATELET
BASOS ABS: 0.1 10*3/uL (ref 0–0.1)
Basophils Relative: 1 %
EOS PCT: 1 %
Eosinophils Absolute: 0.1 10*3/uL (ref 0–0.7)
HCT: 38 % (ref 35.0–47.0)
Hemoglobin: 13.5 g/dL (ref 12.0–16.0)
LYMPHS ABS: 2.1 10*3/uL (ref 1.0–3.6)
Lymphocytes Relative: 33 %
MCH: 31.2 pg (ref 26.0–34.0)
MCHC: 35.4 g/dL (ref 32.0–36.0)
MCV: 88.1 fL (ref 80.0–100.0)
Monocytes Absolute: 0.4 10*3/uL (ref 0.2–0.9)
Monocytes Relative: 7 %
NEUTROS PCT: 58 %
Neutro Abs: 3.7 10*3/uL (ref 1.4–6.5)
PLATELETS: 184 10*3/uL (ref 150–440)
RBC: 4.31 MIL/uL (ref 3.80–5.20)
RDW: 13.9 % (ref 11.5–14.5)
WBC: 6.3 10*3/uL (ref 3.6–11.0)

## 2017-06-18 LAB — C DIFFICILE QUICK SCREEN W PCR REFLEX
C DIFFICILE (CDIFF) TOXIN: NEGATIVE
C DIFFICLE (CDIFF) ANTIGEN: NEGATIVE
C Diff interpretation: NOT DETECTED

## 2017-06-18 NOTE — Progress Notes (Signed)
Grace Hester is an 56 y.o. female.    Surgical consult, requesting physician: Emergency room physician. Chief Complaint: Sigmoid diverticulitis HPI: This patient who was in the emergency room recently with sigmoid diverticulitis.  It has recurred multiple times this year.  She had seen Dr. Launa Flight at [facility]. Patient describes this being the third episode of recurrent diverticulitis with lower quadrant abdominal pain.  She has been hospitalized both here and at [facility] in the past.  She also had a bout of what she describes as "septic shock" secondary to kidney stones.  Her significant other states that she was on life support for several days.  At that time she suffered a rotator cuff injury as well requiring repair. She is having 12 stools per day of liquid stools but states that this is different than her known previous episodes of C. difficile colitis.  Her brother had diverticulitis and had a resection.  Patient works at [employer] does not smoke or drink.  Past Medical History:  Diagnosis Date  . Abdominal pain   . Acid reflux 01/04/2014  . Acute respiratory failure with hypoxemia ([place]) 09/20/2014  . Anemia   . Anxiety   . Asthma   . Benign neoplasm of sigmoid colon   . Billowing mitral valve   . Calculus of kidney 02/07/2013  . Complication of anesthesia    pneumonia after anesthesia  . Depression   . Essential (primary) hypertension 01/10/2015  . GERD (gastroesophageal reflux disease)   . Hypertension   . Incomplete emptying of bladder 07/05/2014  . Kidney stones   . PVC (premature ventricular contraction)   . Sciatica of left side   . Septic shock ([place]) 2015  . Sigmoid diverticulitis 01/27/2017  . Tachycardia 04/17/2014  . Thrombocytopenia ([place]) 09/20/2014    Past Surgical History:  Procedure Laterality Date  . ABDOMINAL HYSTERECTOMY    . AUGMENTATION MAMMAPLASTY  2008  . COLONOSCOPY WITH PROPOFOL N/A 03/14/2016   Procedure: COLONOSCOPY WITH  PROPOFOL;  Surgeon: Lucilla Lame, MD;  Location: [place];  Service: Endoscopy;  Laterality: N/A;  . ESOPHAGOGASTRODUODENOSCOPY (EGD) WITH PROPOFOL N/A 03/14/2016   Procedure: ESOPHAGOGASTRODUODENOSCOPY (EGD) WITH PROPOFOL;  Surgeon: Lucilla Lame, MD;  Location: [place];  Service: Endoscopy;  Laterality: N/A;  . FLEXOR TENDON REPAIR Left 08/04/13   Dr. Elvina Mattes, [facility]  . KIDNEY STONE SURGERY  2015  . POLYPECTOMY  03/14/2016   Procedure: POLYPECTOMY;  Surgeon: Lucilla Lame, MD;  Location: [place];  Service: Endoscopy;;  . ROTATOR CUFF REPAIR Left     Family History  Problem Relation Age of Onset  . Heart disease Mother   . Stroke Mother   . Heart disease Father    Social History:  reports that she quit smoking about 17 years ago. She has never used smokeless tobacco. She reports that she does not drink alcohol or use drugs.  Allergies:  Allergies  Allergen Reactions  . Latex Other (See Comments)    Burning, raw skin from underwear elastic  . Tape Itching    Blisters and tears skin     (Not in a hospital admission)   Review of Systems:   Review of Systems  Constitutional: Negative.   HENT: Negative.   Eyes: Negative.   Respiratory: Negative.   Cardiovascular: Negative.   Gastrointestinal: Positive for abdominal pain and diarrhea. Negative for blood in stool, constipation, heartburn, melena, nausea and vomiting.  Genitourinary: Negative.   Musculoskeletal: Negative.   Skin: Negative.  Neurological: Negative.   Endo/Heme/Allergies: Negative.   Psychiatric/Behavioral: Negative.     Physical Exam:  Physical Exam  Constitutional: She is oriented to person, place, and time and well-developed, well-nourished, and in no distress. No distress.  HENT:  Head: Normocephalic and atraumatic.  Eyes: Pupils are equal, round, and reactive to light. Right eye exhibits no discharge. Left eye exhibits no discharge. No scleral icterus.  Neck: Normal  range of motion.  Cardiovascular: Normal rate, regular rhythm and normal heart sounds.   Pulmonary/Chest: Effort normal and breath sounds normal. No respiratory distress. She has no wheezes. She has no rales.  Abdominal: Soft. She exhibits no distension. There is tenderness. There is no rebound and no guarding.  Tenderness maximal in left lower quadrant without guarding or rebound tenderness.  Small umbilical hernia present which is nontender and reducible  Musculoskeletal: Normal range of motion. She exhibits no edema or tenderness.  Lymphadenopathy:    She has no cervical adenopathy.  Neurological: She is alert and oriented to person, place, and time.  Skin: Skin is warm and dry. No rash noted. She is not diaphoretic. No erythema.  Psychiatric: Mood and affect normal.  Vitals reviewed.   There were no vitals taken for this visit.    No results found for this or any previous visit (from the past 48 hour(s)). No results found.   Assessment/Plan CT scan from 8 days ago was personally reviewed.  With the patient's worsening of her symptoms now and persistence I would recommend continuing her oral antibiotics for now and getting a CT scan and white blood cell count as well as a C. difficile study.  We will call her with those results.  At this point she does not appear dehydrated and does not appear to need to be admitted to the hospital but that could change pending the results above.  Florene Glen, MD, FACS

## 2017-06-18 NOTE — Patient Instructions (Signed)
We have scheduled you for a CT Scan of your Abdomen and Pelvis. This has been scheduled on 06/18/17 at our [facility] location. Please Check-in at 0845, 15 minutes prior to your scheduled appointment. If you need to reschedule your Scan, you may do so by calling [phone].  You will need to pick up a prep kit at least 24 hours in advance of your Scan: You may pick this up at the [place] department at [place] Location, or [employer].  Bring a list of medications with you to your appointment and you may have nothing to eat or drink 4 hours prior to your CT Scan.

## 2017-06-19 ENCOUNTER — Telehealth: Payer: Self-pay

## 2017-06-19 ENCOUNTER — Encounter: Payer: Self-pay | Admitting: Surgery

## 2017-06-19 ENCOUNTER — Ambulatory Visit (INDEPENDENT_AMBULATORY_CARE_PROVIDER_SITE_OTHER): Payer: 59 | Admitting: Surgery

## 2017-06-19 ENCOUNTER — Ambulatory Visit
Admission: RE | Admit: 2017-06-19 | Discharge: 2017-06-19 | Disposition: A | Discharge status: Home / Self Care | Payer: 59 | Source: Ambulatory Visit | Attending: Surgery | Admitting: Surgery

## 2017-06-19 ENCOUNTER — Other Ambulatory Visit: Payer: Self-pay

## 2017-06-19 VITALS — BP 157/90 | HR 96 | Temp 98.6°F | Ht 71.0 in | Wt 231.4 lb

## 2017-06-19 DIAGNOSIS — K760 Fatty (change of) liver, not elsewhere classified: Secondary | ICD-10-CM | POA: Diagnosis not present

## 2017-06-19 DIAGNOSIS — R109 Unspecified abdominal pain: Secondary | ICD-10-CM

## 2017-06-19 DIAGNOSIS — K5792 Diverticulitis of intestine, part unspecified, without perforation or abscess without bleeding: Secondary | ICD-10-CM

## 2017-06-19 DIAGNOSIS — K5732 Diverticulitis of large intestine without perforation or abscess without bleeding: Secondary | ICD-10-CM

## 2017-06-19 DIAGNOSIS — R161 Splenomegaly, not elsewhere classified: Secondary | ICD-10-CM | POA: Insufficient documentation

## 2017-06-19 DIAGNOSIS — R1030 Lower abdominal pain, unspecified: Secondary | ICD-10-CM | POA: Diagnosis present

## 2017-06-19 LAB — POCT I-STAT CREATININE: CREATININE: 0.6 mg/dL (ref 0.44–1.00)

## 2017-06-19 MED ORDER — IOPAMIDOL (ISOVUE-300) INJECTION 61%
100.0000 mL | Freq: Once | INTRAVENOUS | Status: AC | PRN
  Administered 2017-06-19: 100 mL via INTRAVENOUS

## 2017-06-19 NOTE — Progress Notes (Signed)
Outpatient Surgical Follow Up  06/19/2017  Grace Hester is an 56 y.o. female.   Chief Complaint  Patient presents with  . Follow-up    Diverticulitis    HPI: recurrent diverticulitis, Main issues is diarrhea. Overall feels better. Some nausea. Has been on augmentin and flagyl for 1 week. LAbs and CT personally reviewed. NO evidence of perforation, abscess or free air. CBC is normal. No fevers. C diff neg  Past Medical History:  Diagnosis Date  . Abdominal pain   . Acid reflux 01/04/2014  . Acute respiratory failure with hypoxemia ([place]) 09/20/2014  . Anemia   . Anxiety   . Asthma   . Benign neoplasm of sigmoid colon   . Billowing mitral valve   . Calculus of kidney 02/07/2013  . Complication of anesthesia    pneumonia after anesthesia  . Depression   . Essential (primary) hypertension 01/10/2015  . GERD (gastroesophageal reflux disease)   . Hypertension   . Incomplete emptying of bladder 07/05/2014  . Kidney stones   . PVC (premature ventricular contraction)   . Sciatica of left side   . Septic shock ([place]) 2015  . Sigmoid diverticulitis 01/27/2017  . Tachycardia 04/17/2014  . Thrombocytopenia ([place]) 09/20/2014    Past Surgical History:  Procedure Laterality Date  . ABDOMINAL HYSTERECTOMY    . AUGMENTATION MAMMAPLASTY  2008  . COLONOSCOPY WITH PROPOFOL N/A 03/14/2016   Procedure: COLONOSCOPY WITH PROPOFOL;  Surgeon: Lucilla Lame, MD;  Location: [place];  Service: Endoscopy;  Laterality: N/A;  . ESOPHAGOGASTRODUODENOSCOPY (EGD) WITH PROPOFOL N/A 03/14/2016   Procedure: ESOPHAGOGASTRODUODENOSCOPY (EGD) WITH PROPOFOL;  Surgeon: Lucilla Lame, MD;  Location: [place];  Service: Endoscopy;  Laterality: N/A;  . FLEXOR TENDON REPAIR Left 08/04/13   Dr. Elvina Mattes, [facility]  . KIDNEY STONE SURGERY  2015  . POLYPECTOMY  03/14/2016   Procedure: POLYPECTOMY;  Surgeon: Lucilla Lame, MD;  Location: [place];  Service: Endoscopy;;  . ROTATOR CUFF REPAIR Left      Family History  Problem Relation Age of Onset  . Heart disease Mother   . Stroke Mother   . Heart disease Father     Social History:  reports that she quit smoking about 17 years ago. She has never used smokeless tobacco. She reports that she does not drink alcohol or use drugs.  Allergies:  Allergies  Allergen Reactions  . Latex Other (See Comments)    Burning, raw skin from underwear elastic  . Tape Itching    Blisters and tears skin    Medications reviewed.    ROS Full ROS performed and is otherwise negative other than what is stated in HPI   BP (!) 157/90   Pulse 96   Temp 98.6 F (37 C) (Oral)   Ht 5\' 11"  (1.803 m)   Wt 105 kg (231 lb 6.4 oz)   BMI 32.27 kg/m   Physical Exam  Constitutional: She is oriented to person, place, and time and well-developed, well-nourished, and in no distress. No distress.  Eyes: Right eye exhibits no discharge. Left eye exhibits no discharge. No scleral icterus.  Neck: Normal range of motion. No JVD present. No tracheal deviation present.  Pulmonary/Chest: Effort normal. No stridor. No respiratory distress.  Abdominal: Soft. She exhibits no distension. There is no tenderness. There is no rebound and no guarding.  Neurological: She is alert and oriented to person, place, and time. Gait normal. GCS score is 15.  Skin: She is not diaphoretic.  Psychiatric:  Mood, memory, affect and judgment normal.  Nursing note and vitals reviewed.      Results for orders placed or performed during the hospital encounter of 06/18/17 (from the past 48 hour(s))  CBC with Differential/Platelet     Status: None   Collection Time: 06/18/17  5:11 PM  Result Value Ref Range   WBC 6.3 3.6 - 11.0 K/uL   RBC 4.31 3.80 - 5.20 MIL/uL   Hemoglobin 13.5 12.0 - 16.0 g/dL   HCT 38.0 35.0 - 47.0 %   MCV 88.1 80.0 - 100.0 fL   MCH 31.2 26.0 - 34.0 pg   MCHC 35.4 32.0 - 36.0 g/dL   RDW 13.9 11.5 - 14.5 %   Platelets 184 150 - 440 K/uL   Neutrophils  Relative % 58 %   Neutro Abs 3.7 1.4 - 6.5 K/uL   Lymphocytes Relative 33 %   Lymphs Abs 2.1 1.0 - 3.6 K/uL   Monocytes Relative 7 %   Monocytes Absolute 0.4 0.2 - 0.9 K/uL   Eosinophils Relative 1 %   Eosinophils Absolute 0.1 0 - 0.7 K/uL   Basophils Relative 1 %   Basophils Absolute 0.1 0 - 0.1 K/uL  C difficile quick scan w PCR reflex     Status: None   Collection Time: 06/18/17  5:18 PM  Result Value Ref Range   C Diff antigen NEGATIVE NEGATIVE   C Diff toxin NEGATIVE NEGATIVE   C Diff interpretation No C. difficile detected.    Ct Abdomen Pelvis W Contrast  Result Date: 06/19/2017 CLINICAL DATA:  Patient with lower abdominal pain, nausea, vomiting and diarrhea. EXAM: CT ABDOMEN AND PELVIS WITH CONTRAST TECHNIQUE: Multidetector CT imaging of the abdomen and pelvis was performed using the standard protocol following bolus administration of intravenous contrast. CONTRAST:  141mL ISOVUE-300 IOPAMIDOL (ISOVUE-300) INJECTION 61% COMPARISON:  CT abdomen pelvis 06/10/2017. FINDINGS: Lower chest: Normal heart size. No pericardial effusion. Dependent atelectasis within the lower lobes bilaterally. No pleural effusion. Hepatobiliary: The liver is diffusely low in attenuation compatible with steatosis. Fatty sparing adjacent to the gallbladder fossa. Gallbladder is unremarkable. No intrahepatic or extrahepatic biliary ductal dilatation. Pancreas: Unremarkable Spleen: Splenomegaly measuring 17.5 cm (image 27; series 2). Adrenals/Urinary Tract: Normal adrenal glands. Kidneys enhance symmetrically with contrast. No hydronephrosis. Urinary bladder is unremarkable. Stomach/Bowel: Descending and sigmoid colonic diverticulosis. Significant interval improvement and near complete resolution of previously visualized inflammatory changes about the sigmoid colon. Mild residual inflammatory stranding about the sigmoid colon (image 73; series 5). No evidence for bowel obstruction. Small hiatal hernia. Normal  morphology of the stomach. No free fluid or free intraperitoneal air. Vascular/Lymphatic: Normal caliber abdominal aorta. No retroperitoneal lymphadenopathy. Peripheral calcified atherosclerotic plaque. Reproductive: Uterus is surgically absent. Other: None. Musculoskeletal: Lumbar spine degenerative changes. No aggressive or acute appearing osseous lesions. IMPRESSION: 1. Interval improvement but not complete resolution of inflammatory changes about the sigmoid colon most compatible with improving diverticulitis. 2. Hepatic steatosis. 3. Splenomegaly. Electronically Signed   By: Lovey Newcomer M.D.   On: 06/19/2017 11:30    Assessment/Plan: Improving from diverticulitis, given severe diarrhea we will hold from A/bs. Clinically improving. No need for emergent surgical intervention F/U 2 weeks. I spent at least 25 minutes in this encounter w > 50% spent in counseling and coordination of care  Caroleen Hamman, MD [employer] General Surgeon

## 2017-06-19 NOTE — Telephone Encounter (Signed)
Received patient's FMLA paperwork. Filled out and faxed back to Nurse at [employer] [phone] with positive confirmation.  I am currently awaiting short term disability paperwork from [facility] at this time. [place] fax [phone] and the case number given to patient is: [id number]

## 2017-06-19 NOTE — Patient Instructions (Addendum)
Stop your antibiotics today.  We will see you back in 2 weeks.   If you continue to feel bad after stopping antibiotics or you have increased abdominal pain/fever, or nausea/vomiting- call our office immediately and speak with a nurse.  We will place you on FMLA and Short term disability until your follow-up appointment. Have your short term disability information sent over via fax [phone].

## 2017-06-22 ENCOUNTER — Telehealth: Payer: Self-pay

## 2017-06-22 NOTE — Telephone Encounter (Signed)
Stopped Antibiotics on Friday. Continues with diarrhea. Has went 4 times today already. Denies nausea or vomiting. Abdominal pain still exists intermittenetly and is described as crampy. She states that pain is slightly better than last week. 4/10 is worst when prior to BM. Still fatigued. Appetite is fair but is improving slowly.   Spoke with Dr. Hampton Abbot. Patient was instructed to begin Metamucil twice daily, increase her water intake to help avoid dehydration, and also begin probiotics daily. I encouraged her to call back with any further questions or concerns she may have. I will call for another update on patient tomorrow to make sure she is doing better.

## 2017-06-26 ENCOUNTER — Telehealth: Payer: Self-pay | Admitting: Surgery

## 2017-06-26 NOTE — Telephone Encounter (Signed)
FMLA forms have been completed and faxed to [phone]. [employer].

## 2017-06-30 ENCOUNTER — Telehealth: Payer: Self-pay | Admitting: Surgery

## 2017-06-30 NOTE — Telephone Encounter (Signed)
Patient's FMLA/Disability forms have been faxed to [place] on 06/26/17 to [phone] including requested clinic notes and imaging reports.

## 2017-07-02 ENCOUNTER — Encounter: Payer: Self-pay | Admitting: Surgery

## 2017-07-02 ENCOUNTER — Ambulatory Visit (INDEPENDENT_AMBULATORY_CARE_PROVIDER_SITE_OTHER): Payer: 59 | Admitting: Surgery

## 2017-07-02 VITALS — BP 142/89 | HR 93 | Temp 98.6°F | Ht 71.0 in | Wt 231.6 lb

## 2017-07-02 DIAGNOSIS — K5732 Diverticulitis of large intestine without perforation or abscess without bleeding: Secondary | ICD-10-CM | POA: Diagnosis not present

## 2017-07-02 NOTE — Patient Instructions (Addendum)
We will send the referral to Dr.Anna's office and someone from their office will call you to schedule an appointment.  If you do not hear from them in 5-7 days please call our office and let us know so we can check on this for you.   Please see your follow up appointment with Dr.Pabon listed below.

## 2017-07-02 NOTE — Progress Notes (Signed)
Patient seen and examined. She had a history of recurrent diverticulitis. She completed antibiotics and did very well. Currently pain free. No fevers no chills. This is the third episode diverticulitis within the last year. Colonoscopy more than one year ago showing a polyp  PE NAD Abd: soft, nt  A/P recurrent diverticulitis. She is interested in elective sigmoid colectomy. We will need a colonoscopy in about 4 weeks and return to the office in 6 weeks for surgical discussion of laparoscopic sigmoid colectomy. Discussed with the patient in detail about procedure risk benefits possible complications. I spent 25 minutes in this encounter with greater than 50% of the time spent in coordination and counseling and care

## 2017-07-06 ENCOUNTER — Telehealth: Payer: Self-pay | Admitting: Gastroenterology

## 2017-07-06 NOTE — Telephone Encounter (Signed)
Patient left a voice message stating you were suppose to call her back. She would like for you to call her before the end of the day.

## 2017-07-06 NOTE — Telephone Encounter (Signed)
Patient called and needs a colonoscopy per [facility] asap for diverticulitis before her surgery on 08/03/17.

## 2017-07-07 ENCOUNTER — Other Ambulatory Visit: Payer: Self-pay

## 2017-07-07 ENCOUNTER — Telehealth: Payer: Self-pay | Admitting: Gastroenterology

## 2017-07-07 DIAGNOSIS — K5732 Diverticulitis of large intestine without perforation or abscess without bleeding: Secondary | ICD-10-CM

## 2017-07-07 NOTE — Telephone Encounter (Signed)
Patient called wanting to schedule colonoscopy. This needs to be done asap per [place].

## 2017-07-08 ENCOUNTER — Other Ambulatory Visit: Payer: Self-pay

## 2017-07-08 DIAGNOSIS — K5732 Diverticulitis of large intestine without perforation or abscess without bleeding: Secondary | ICD-10-CM

## 2017-07-10 ENCOUNTER — Ambulatory Visit: Admit: 2017-07-10 | Payer: 59 | Admitting: Gastroenterology

## 2017-07-10 SURGERY — COLONOSCOPY WITH PROPOFOL
Anesthesia: General

## 2017-07-13 ENCOUNTER — Telehealth: Payer: Self-pay

## 2017-07-14 NOTE — Telephone Encounter (Signed)
error 

## 2017-07-20 ENCOUNTER — Encounter: Payer: Self-pay | Admitting: *Deleted

## 2017-07-20 ENCOUNTER — Other Ambulatory Visit: Payer: Self-pay

## 2017-07-24 NOTE — Discharge Instructions (Signed)
General Anesthesia, Adult, Care After °These instructions provide you with information about caring for yourself after your procedure. Your health care provider may also give you more specific instructions. Your treatment has been planned according to current medical practices, but problems sometimes occur. Call your health care provider if you have any problems or questions after your procedure. °What can I expect after the procedure? °After the procedure, it is common to have: °· Vomiting. °· A sore throat. °· Mental slowness. ° °It is common to feel: °· Nauseous. °· Cold or shivery. °· Sleepy. °· Tired. °· Sore or achy, even in parts of your body where you did not have surgery. ° °Follow these instructions at home: °For at least 24 hours after the procedure: °· Do not: °? Participate in activities where you could fall or become injured. °? Drive. °? Use heavy machinery. °? Drink alcohol. °? Take sleeping pills or medicines that cause drowsiness. °? Make important decisions or sign legal documents. °? Take care of children on your own. °· Rest. °Eating and drinking °· If you vomit, drink water, juice, or soup when you can drink without vomiting. °· Drink enough fluid to keep your urine clear or pale yellow. °· Make sure you have little or no nausea before eating solid foods. °· Follow the diet recommended by your health care provider. °General instructions °· Have a responsible adult stay with you until you are awake and alert. °· Return to your normal activities as told by your health care provider. Ask your health care provider what activities are safe for you. °· Take over-the-counter and prescription medicines only as told by your health care provider. °· If you smoke, do not smoke without supervision. °· Keep all follow-up visits as told by your health care provider. This is important. °Contact a health care provider if: °· You continue to have nausea or vomiting at home, and medicines are not helpful. °· You  cannot drink fluids or start eating again. °· You cannot urinate after 8-12 hours. °· You develop a skin rash. °· You have fever. °· You have increasing redness at the site of your procedure. °Get help right away if: °· You have difficulty breathing. °· You have chest pain. °· You have unexpected bleeding. °· You feel that you are having a life-threatening or urgent problem. °This information is not intended to replace advice given to you by your health care provider. Make sure you discuss any questions you have with your health care provider. °Document Released: 11/17/2000 Document Revised: 01/14/2016 Document Reviewed: 07/26/2015 °Elsevier Interactive Patient Education © 2018 Elsevier Inc. ° °

## 2017-07-27 ENCOUNTER — Encounter
Admission: RE | Disposition: A | Discharge status: Home / Self Care | Payer: Self-pay | Source: Ambulatory Visit | Attending: Gastroenterology

## 2017-07-27 ENCOUNTER — Ambulatory Visit: Payer: 59 | Admitting: Anesthesiology

## 2017-07-27 ENCOUNTER — Ambulatory Visit
Admission: RE | Admit: 2017-07-27 | Discharge: 2017-07-27 | Disposition: A | Discharge status: Home / Self Care | Payer: 59 | Source: Ambulatory Visit | Attending: Gastroenterology | Admitting: Gastroenterology

## 2017-07-27 DIAGNOSIS — I493 Ventricular premature depolarization: Secondary | ICD-10-CM | POA: Insufficient documentation

## 2017-07-27 DIAGNOSIS — F329 Major depressive disorder, single episode, unspecified: Secondary | ICD-10-CM | POA: Diagnosis not present

## 2017-07-27 DIAGNOSIS — Z91011 Allergy to milk products: Secondary | ICD-10-CM | POA: Diagnosis not present

## 2017-07-27 DIAGNOSIS — K219 Gastro-esophageal reflux disease without esophagitis: Secondary | ICD-10-CM | POA: Diagnosis not present

## 2017-07-27 DIAGNOSIS — Z79899 Other long term (current) drug therapy: Secondary | ICD-10-CM | POA: Insufficient documentation

## 2017-07-27 DIAGNOSIS — Z8719 Personal history of other diseases of the digestive system: Secondary | ICD-10-CM | POA: Diagnosis not present

## 2017-07-27 DIAGNOSIS — F419 Anxiety disorder, unspecified: Secondary | ICD-10-CM | POA: Insufficient documentation

## 2017-07-27 DIAGNOSIS — Z8249 Family history of ischemic heart disease and other diseases of the circulatory system: Secondary | ICD-10-CM | POA: Insufficient documentation

## 2017-07-27 DIAGNOSIS — I10 Essential (primary) hypertension: Secondary | ICD-10-CM | POA: Diagnosis not present

## 2017-07-27 DIAGNOSIS — K5732 Diverticulitis of large intestine without perforation or abscess without bleeding: Secondary | ICD-10-CM | POA: Diagnosis not present

## 2017-07-27 DIAGNOSIS — D122 Benign neoplasm of ascending colon: Secondary | ICD-10-CM | POA: Insufficient documentation

## 2017-07-27 DIAGNOSIS — K5730 Diverticulosis of large intestine without perforation or abscess without bleeding: Secondary | ICD-10-CM | POA: Diagnosis not present

## 2017-07-27 DIAGNOSIS — Z87891 Personal history of nicotine dependence: Secondary | ICD-10-CM | POA: Diagnosis not present

## 2017-07-27 DIAGNOSIS — J45909 Unspecified asthma, uncomplicated: Secondary | ICD-10-CM | POA: Insufficient documentation

## 2017-07-27 DIAGNOSIS — K5792 Diverticulitis of intestine, part unspecified, without perforation or abscess without bleeding: Secondary | ICD-10-CM | POA: Diagnosis present

## 2017-07-27 DIAGNOSIS — Z888 Allergy status to other drugs, medicaments and biological substances status: Secondary | ICD-10-CM | POA: Insufficient documentation

## 2017-07-27 DIAGNOSIS — K640 First degree hemorrhoids: Secondary | ICD-10-CM | POA: Insufficient documentation

## 2017-07-27 HISTORY — PX: COLONOSCOPY WITH PROPOFOL: SHX5780

## 2017-07-27 HISTORY — PX: POLYPECTOMY: SHX149

## 2017-07-27 SURGERY — COLONOSCOPY WITH PROPOFOL
Anesthesia: General | Wound class: Contaminated

## 2017-07-27 MED ORDER — ONDANSETRON HCL 4 MG/2ML IJ SOLN: 4.0000 mg | Freq: Once | INTRAMUSCULAR | Status: DC | PRN

## 2017-07-27 MED ORDER — SODIUM CHLORIDE 0.9 % IV SOLN: INTRAVENOUS | Status: DC

## 2017-07-27 MED ORDER — ACETAMINOPHEN 160 MG/5ML PO SOLN: 325.0000 mg | ORAL | Status: DC | PRN

## 2017-07-27 MED ORDER — LIDOCAINE HCL (CARDIAC) 20 MG/ML IV SOLN
INTRAVENOUS | Status: DC | PRN
  Administered 2017-07-27: 50 mg via INTRAVENOUS

## 2017-07-27 MED ORDER — PROPOFOL 10 MG/ML IV BOLUS
INTRAVENOUS | Status: DC | PRN
  Administered 2017-07-27 (×9): 50 mg via INTRAVENOUS
  Administered 2017-07-27: 100 mg via INTRAVENOUS
  Administered 2017-07-27 (×5): 50 mg via INTRAVENOUS

## 2017-07-27 MED ORDER — LACTATED RINGERS IV SOLN
INTRAVENOUS | Status: DC
  Administered 2017-07-27: 08:00:00 via INTRAVENOUS

## 2017-07-27 MED ORDER — ACETAMINOPHEN 325 MG PO TABS: 650.0000 mg | ORAL_TABLET | Freq: Once | ORAL | Status: DC | PRN

## 2017-07-27 SURGICAL SUPPLY — 15 items
CANISTER SUCT 1200ML W/VALVE (MISCELLANEOUS) ×4 IMPLANT
CLIP HMST 235XBRD CATH ROT (MISCELLANEOUS) IMPLANT
CLIP RESOLUTION 360 11X235 (MISCELLANEOUS) ×8
FCP ESCP3.2XJMB 240X2.8X (MISCELLANEOUS)
FORCEPS BIOP RAD 4 LRG CAP 4 (CUTTING FORCEPS) IMPLANT
FORCEPS BIOP RJ4 240 W/NDL (MISCELLANEOUS)
FORCEPS ESCP3.2XJMB 240X2.8X (MISCELLANEOUS) IMPLANT
GOWN CVR UNV OPN BCK APRN NK (MISCELLANEOUS) ×4 IMPLANT
GOWN ISOL THUMB LOOP REG UNIV (MISCELLANEOUS) ×8
KIT ENDO PROCEDURE OLY (KITS) ×4 IMPLANT
PAD GROUND ADULT SPLIT (MISCELLANEOUS) IMPLANT
SNARE SHORT THROW 13M SML OVAL (MISCELLANEOUS) ×2 IMPLANT
SNARE SPIRAL (MISCELLANEOUS) ×2 IMPLANT
TRAP ETRAP POLY (MISCELLANEOUS) ×2 IMPLANT
WATER STERILE IRR 250ML POUR (IV SOLUTION) ×4 IMPLANT

## 2017-07-27 NOTE — H&P (Signed)
Grace Lame, MD [facility] [address], [place], [place] [address] [phone] Fax : [phone]  Primary Care Physician:  Lynnell Jude, MD Primary Gastroenterologist:  Dr. Allen Hester  Pre-Procedure History & Physical: HPI:  Grace Hester is a 56 y.o. female is here for an colonoscopy.   Past Medical History:  Diagnosis Date  . Abdominal pain   . Acid reflux 01/04/2014  . Acute respiratory failure with hypoxemia ([place]) 09/20/2014  . Anemia   . Anxiety   . Asthma   . Benign neoplasm of sigmoid colon   . Billowing mitral valve   . Calculus of kidney 02/07/2013  . Complication of anesthesia    pneumonia after anesthesia  . Depression   . Essential (primary) hypertension 01/10/2015  . GERD (gastroesophageal reflux disease)   . Hypertension   . Incomplete emptying of bladder 07/05/2014  . Kidney stones   . PVC (premature ventricular contraction)   . Sciatica of left side   . Septic shock ([place]) 2015  . Sigmoid diverticulitis 01/27/2017  . Tachycardia 04/17/2014  . Thrombocytopenia ([place]) 09/20/2014    Past Surgical History:  Procedure Laterality Date  . ABDOMINAL HYSTERECTOMY    . AUGMENTATION MAMMAPLASTY  2008  . COLONOSCOPY WITH PROPOFOL N/A 03/14/2016   Procedure: COLONOSCOPY WITH PROPOFOL;  Surgeon: Grace Lame, MD;  Location: [place];  Service: Endoscopy;  Laterality: N/A;  . ESOPHAGOGASTRODUODENOSCOPY (EGD) WITH PROPOFOL N/A 03/14/2016   Procedure: ESOPHAGOGASTRODUODENOSCOPY (EGD) WITH PROPOFOL;  Surgeon: Grace Lame, MD;  Location: [facility];  Service: Endoscopy;  Laterality: N/A;  . FLEXOR TENDON REPAIR Left 08/04/13   Dr. Elvina Hester, [facility]  . KIDNEY STONE SURGERY  2015  . POLYPECTOMY  03/14/2016   Procedure: POLYPECTOMY;  Surgeon: Grace Lame, MD;  Location: [facility];  Service: Endoscopy;;  . ROTATOR CUFF REPAIR Left     Prior to Admission medications   Medication Sig Start Date End Date Taking? Authorizing Provider    albuterol (PROVENTIL HFA;VENTOLIN HFA) 108 (90 Base) MCG/ACT inhaler Inhale 1-2 puffs into the lungs every 6 (six) hours as needed for wheezing or shortness of breath. 04/03/16  Yes Lorin Picket, PA-C  ALPRAZolam (XANAX) 0.5 MG tablet TAKE 1 TABLET BY MOUTH 3 TIMES A DAY AS NEEDED FOR ANXIETY 06/18/15  Yes [provider]  ARIPiprazole (ABILIFY) 2 MG tablet Take 2 mg by mouth daily.  06/18/15  Yes [provider]  busPIRone (BUSPAR) 10 MG tablet Take 10 mg by mouth 2 (two) times daily.   Yes [provider]  esomeprazole (NEXIUM) 20 MG capsule Take 20 mg by mouth daily at 12 noon.    Yes [provider]  fexofenadine (ALLEGRA) 180 MG tablet Take 180 mg by mouth daily.    Yes [provider]  fluticasone (FLONASE) 50 MCG/ACT nasal spray Place 2 sprays into the nose 1 day or 1 dose.   Yes [provider]  gabapentin (NEURONTIN) 300 MG capsule Take 300-900 mg by mouth 3 (three) times daily. Take 1 cap qam. 1 in the afternoon and 3 caps qhs 10/30/14  Yes [provider]  hydrochlorothiazide (HYDRODIURIL) 12.5 MG tablet Take 12.5 mg by mouth daily.  04/08/15  Yes [provider]  metoprolol succinate (TOPROL-XL) 50 MG 24 hr tablet Take 50 mg by mouth daily.  12/29/14  Yes [provider]  sertraline (ZOLOFT) 100 MG tablet Take 200 mg by mouth at bedtime.  10/30/14  Yes [provider]  Allergies as of 07/08/2017 - Review Complete 07/02/2017  Allergen Reaction Noted  . Latex Other (See Comments) 02/13/2016  . Tape Itching 02/13/2016    Family History  Problem Relation Age of Onset  . Heart disease Mother   . Stroke Mother   . Heart disease Father     Social History   Socioeconomic History  . Marital status: Married    Spouse name: Not on file  . Number of children: Not on file  . Years of education: Not on file  . Highest education level: Not on file  Social Needs  . Financial resource strain: Not  on file  . Food insecurity - worry: Not on file  . Food insecurity - inability: Not on file  . Transportation needs - medical: Not on file  . Transportation needs - non-medical: Not on file  Occupational History  . Not on file  Tobacco Use  . Smoking status: Former Smoker    Last attempt to quit: 01/27/2000    Years since quitting: 17.5  . Smokeless tobacco: Never Used  Substance and Sexual Activity  . Alcohol use: No  . Drug use: No  . Sexual activity: Not on file  Other Topics Concern  . Not on file  Social History Narrative  . Not on file    Review of Systems: See HPI, otherwise negative ROS  Physical Exam: BP (!) 139/95   Pulse 80   Temp 97.7 F (36.5 C) (Temporal)   Resp 16   Ht 5\' 11"  (1.803 m)   Wt 225 lb (102.1 kg)   SpO2 97%   BMI 31.38 kg/m  General:   Alert,  pleasant and cooperative in NAD Head:  Normocephalic and atraumatic. Neck:  Supple; no masses or thyromegaly. Lungs:  Clear throughout to auscultation.    Heart:  Regular rate and rhythm. Abdomen:  Soft, nontender and nondistended. Normal bowel sounds, without guarding, and without rebound.   Neurologic:  Alert and  oriented x4;  grossly normal neurologically.  Impression/Plan: AMELI Hester is here for an colonoscopy to be performed for recent diverticulitis  Risks, benefits, limitations, and alternatives regarding  colonoscopy have been reviewed with the patient.  Questions have been answered.  All parties agreeable.   Grace Lame, MD  07/27/2017, 8:49 AM

## 2017-07-27 NOTE — Anesthesia Preprocedure Evaluation (Signed)
Anesthesia Evaluation  Patient identified by MRN, date of birth, ID band Patient awake    History of Anesthesia Complications Negative for: history of anesthetic complications  Airway Mallampati: I  TM Distance: >3 FB Neck ROM: Full    Dental no notable dental hx.    Pulmonary sleep apnea and Continuous Positive Airway Pressure Ventilation , former smoker (quit 2001),    Pulmonary exam normal breath sounds clear to auscultation       Cardiovascular Exercise Tolerance: Good hypertension, Normal cardiovascular exam Rhythm:Regular Rate:Normal  PVCs   Neuro/Psych PSYCHIATRIC DISORDERS Anxiety Depression    GI/Hepatic GERD  ,  Endo/Other  negative endocrine ROS  Renal/GU Renal disease (nephrolithiasis)     Musculoskeletal   Abdominal   Peds  Hematology  (+) Blood dyscrasia, anemia ,   Anesthesia Other Findings   Reproductive/Obstetrics                             Anesthesia Physical Anesthesia Plan  ASA: III  Anesthesia Plan: General   Post-op Pain Management:    Induction:   PONV Risk Score and Plan: 2 and Propofol infusion  Airway Management Planned: Natural Airway  Additional Equipment:   Intra-op Plan:   Post-operative Plan:   Informed Consent: I have reviewed the patients History and Physical, chart, labs and discussed the procedure including the risks, benefits and alternatives for the proposed anesthesia with the patient or authorized representative who has indicated his/her understanding and acceptance.     Plan Discussed with: CRNA  Anesthesia Plan Comments:         Anesthesia Quick Evaluation

## 2017-07-27 NOTE — Transfer of Care (Signed)
Immediate Anesthesia Transfer of Care Note  Patient: Grace Hester  Procedure(s) Performed: COLONOSCOPY WITH Biopsy (N/A ) POLYPECTOMY INTESTINAL  Patient Location: PACU  Anesthesia Type: General  Level of Consciousness: awake, alert  and patient cooperative  Airway and Oxygen Therapy: Patient Spontanous Breathing and Patient connected to supplemental oxygen  Post-op Assessment: Post-op Vital signs reviewed, Patient's Cardiovascular Status Stable, Respiratory Function Stable, Patent Airway and No signs of Nausea or vomiting  Post-op Vital Signs: Reviewed and stable  Complications: No apparent anesthesia complications

## 2017-07-27 NOTE — Anesthesia Procedure Notes (Signed)
Performed by: Lamere Lightner, CRNA Pre-anesthesia Checklist: Patient identified, Emergency Drugs available, Suction available, Timeout performed and Patient being monitored Patient Re-evaluated:Patient Re-evaluated prior to induction Oxygen Delivery Method: Nasal cannula Placement Confirmation: positive ETCO2       

## 2017-07-27 NOTE — Anesthesia Postprocedure Evaluation (Signed)
Anesthesia Post Note  Patient: Grace Hester  Procedure(s) Performed: COLONOSCOPY WITH Biopsy (N/A ) POLYPECTOMY INTESTINAL  Patient location during evaluation: PACU Anesthesia Type: General Level of consciousness: awake and alert, oriented and patient cooperative Pain management: pain level controlled Vital Signs Assessment: post-procedure vital signs reviewed and stable Respiratory status: spontaneous breathing, nonlabored ventilation and respiratory function stable Cardiovascular status: blood pressure returned to baseline and stable Postop Assessment: adequate PO intake Anesthetic complications: no    Darrin Nipper

## 2017-07-27 NOTE — Op Note (Signed)
[facility] Gastroenterology Patient Name: Grace Hester Procedure Date: 07/27/2017 9:04 AM MRN: [id number] Account #: [id number] Date of Birth: [birth year] Admit Type: Outpatient Age: 56 Room: [facility] OR ROOM 01 Gender: Female Note Status: Finalized Procedure:            Colonoscopy Indications:          Diverticulitis Providers:            Lucilla Lame MD, MD Referring MD:         Lynnell Jude (Referring MD) Medicines:            Propofol per Anesthesia Complications:        No immediate complications. Procedure:            Pre-Anesthesia Assessment:                       - Prior to the procedure, a History and Physical was                        performed, and patient medications and allergies were                        reviewed. The patient's tolerance of previous                        anesthesia was also reviewed. The risks and benefits of                        the procedure and the sedation options and risks were                        discussed with the patient. All questions were                        answered, and informed consent was obtained. Prior                        Anticoagulants: The patient has taken no previous                        anticoagulant or antiplatelet agents. ASA Grade                        Assessment: II - A patient with mild systemic disease.                        After reviewing the risks and benefits, the patient was                        deemed in satisfactory condition to undergo the                        procedure.                       After obtaining informed consent, the colonoscope was                        passed under direct vision. Throughout the procedure,  the patient's blood pressure, pulse, and oxygen                        saturations were monitored continuously. The [place] [phone]) was introduced through the                        anus and  advanced to the the terminal ileum. The                        colonoscopy was performed without difficulty. The                        patient tolerated the procedure well. The quality of                        the bowel preparation was excellent. Findings:      The perianal and digital rectal examinations were normal.      A 9 mm polyp was found in the ascending colon. The polyp was sessile.       The polyp was removed with a cold snare. Resection and retrieval were       complete. To prevent bleeding post-intervention, two hemostatic clips       were successfully placed (MR conditional). There was no bleeding at the       end of the procedure.      A few small-mouthed diverticula were found in the transverse colon.      A few small-mouthed diverticula were found in the descending colon.      Multiple small-mouthed diverticula were found in the sigmoid colon.      Non-bleeding internal hemorrhoids were found during retroflexion. The       hemorrhoids were Grade I (internal hemorrhoids that do not prolapse).      The terminal ileum appeared normal. Biopsies were taken with a cold       forceps for histology. Impression:           - One 9 mm polyp in the ascending colon, removed with a                        cold snare. Resected and retrieved. Clips (MR                        conditional) were placed.                       - Diverticulosis in the transverse colon.                       - Diverticulosis in the descending colon.                       - Diverticulosis in the sigmoid colon.                       - Non-bleeding internal hemorrhoids.                       - The examined portion of the ileum was normal.  Biopsied. Recommendation:       - Discharge patient to home.                       - Resume previous diet.                       - Continue present medications.                       - Await pathology results.                       - Repeat colonoscopy  in 5 years if polyp adenoma and 10                        years if hyperplastic Procedure Code(s):    --- Professional ---                       [phone], Colonoscopy, flexible; with removal of tumor(s),                        polyp(s), or other lesion(s) by snare technique                       45380, 75, Colonoscopy, flexible; with biopsy, single                        or multiple Diagnosis Code(s):    --- Professional ---                       W43.15, Diverticulitis of large intestine without                        perforation or abscess without bleeding                       D12.2, Benign neoplasm of ascending colon CPT copyright 2016 American Medical Association. All rights reserved. The codes documented in this report are preliminary and upon coder review may  be revised to meet current compliance requirements. Lucilla Lame MD, MD 07/27/2017 10:01:03 AM This report has been signed electronically. Number of Addenda: 0 Note Initiated On: 07/27/2017 9:04 AM Scope Withdrawal Time: 0 hours 16 minutes 22 seconds  Total Procedure Duration: 0 hours 37 minutes 38 seconds       [employer]

## 2017-07-28 ENCOUNTER — Other Ambulatory Visit: Payer: Self-pay

## 2017-07-28 ENCOUNTER — Telehealth: Payer: Self-pay

## 2017-07-28 ENCOUNTER — Encounter: Payer: Self-pay | Admitting: Gastroenterology

## 2017-07-28 NOTE — Telephone Encounter (Signed)
FMLA paperwork updated and faxed to [facility] @ [phone].  Patient notified as well.

## 2017-07-29 ENCOUNTER — Encounter: Payer: Self-pay | Admitting: Gastroenterology

## 2017-07-29 NOTE — Telephone Encounter (Signed)
Patient notified of FMLA paperwork completed and faxed.

## 2017-07-30 ENCOUNTER — Encounter: Payer: Self-pay | Admitting: Gastroenterology

## 2017-08-03 ENCOUNTER — Ambulatory Visit: Payer: 59 | Admitting: Surgery

## 2017-08-05 ENCOUNTER — Ambulatory Visit (INDEPENDENT_AMBULATORY_CARE_PROVIDER_SITE_OTHER): Payer: 59 | Admitting: Surgery

## 2017-08-05 ENCOUNTER — Encounter: Payer: Self-pay | Admitting: Surgery

## 2017-08-05 VITALS — Ht 71.0 in

## 2017-08-05 DIAGNOSIS — K5732 Diverticulitis of large intestine without perforation or abscess without bleeding: Secondary | ICD-10-CM

## 2017-08-05 MED ORDER — FLEET ENEMA 7-19 GM/118ML RE ENEM: ENEMA | RECTAL | 0 refills | Status: DC

## 2017-08-05 MED ORDER — NEOMYCIN SULFATE 500 MG PO TABS: ORAL_TABLET | ORAL | 0 refills | Status: DC

## 2017-08-05 MED ORDER — BISACODYL 5 MG PO TBEC: DELAYED_RELEASE_TABLET | ORAL | 0 refills | Status: DC

## 2017-08-05 MED ORDER — POLYETHYLENE GLYCOL 3350 17 GM/SCOOP PO POWD: ORAL | 0 refills | Status: DC

## 2017-08-05 MED ORDER — ERYTHROMYCIN BASE 500 MG PO TABS: ORAL_TABLET | ORAL | 0 refills | Status: DC

## 2017-08-05 NOTE — Progress Notes (Signed)
Outpatient Surgical Follow Up  08/05/2017  Grace Hester is an 56 y.o. female.   Chief Complaint  Patient presents with  . Follow-up    Diverticulitis- discuss Colonoscopy results    HPI: Recurrent diverticulitis requiring hospitalization with IV antibiotics as well as multiple outpatient treatments. Patient is very interested in elective sigmoid colectomy. She did complete a colonoscopy with a small TA on the right colon. She does have some bloating and some intermittent mild lower abdominal discomfort. No fevers no chills diverticulitis episode has resolved. Is able to perform more than 4 Mets without any shortness of breath or chest pain. I have personally reviewed her last CT scan from month and half ago showing diverticulitis without perforation. Does have splenomegaly and hepatic steatosis. Her latest creatinine, PL and hemoglobin are stable and adequate. Hx abd hysterectomy  Past Medical History:  Diagnosis Date  . Abdominal pain   . Acid reflux 01/04/2014  . Acute respiratory failure with hypoxemia ([place]) 09/20/2014  . Anemia   . Anxiety   . Asthma   . Benign neoplasm of sigmoid colon   . Billowing mitral valve   . Calculus of kidney 02/07/2013  . Complication of anesthesia    pneumonia after anesthesia  . Depression   . Essential (primary) hypertension 01/10/2015  . GERD (gastroesophageal reflux disease)   . Hypertension   . Incomplete emptying of bladder 07/05/2014  . Kidney stones   . PVC (premature ventricular contraction)   . Sciatica of left side   . Septic shock ([place]) 2015  . Sigmoid diverticulitis 01/27/2017  . Tachycardia 04/17/2014  . Thrombocytopenia ([place]) 09/20/2014    Past Surgical History:  Procedure Laterality Date  . ABDOMINAL HYSTERECTOMY    . AUGMENTATION MAMMAPLASTY  2008  . COLONOSCOPY WITH PROPOFOL N/A 03/14/2016   Procedure: COLONOSCOPY WITH PROPOFOL;  Surgeon: Lucilla Lame, MD;  Location: [facility];  Service: Endoscopy;   Laterality: N/A;  . COLONOSCOPY WITH PROPOFOL N/A 07/27/2017   Procedure: COLONOSCOPY WITH Biopsy;  Surgeon: Lucilla Lame, MD;  Location: [facility];  Service: Endoscopy;  Laterality: N/A;  . ESOPHAGOGASTRODUODENOSCOPY (EGD) WITH PROPOFOL N/A 03/14/2016   Procedure: ESOPHAGOGASTRODUODENOSCOPY (EGD) WITH PROPOFOL;  Surgeon: Lucilla Lame, MD;  Location: [facility];  Service: Endoscopy;  Laterality: N/A;  . FLEXOR TENDON REPAIR Left 08/04/13   Dr. Elvina Mattes, [facility]  . KIDNEY STONE SURGERY  2015  . POLYPECTOMY  03/14/2016   Procedure: POLYPECTOMY;  Surgeon: Lucilla Lame, MD;  Location: [place] [facility];  Service: Endoscopy;;  . POLYPECTOMY  07/27/2017   Procedure: POLYPECTOMY INTESTINAL;  Surgeon: Lucilla Lame, MD;  Location: [place];  Service: Endoscopy;;  Ascending colon polyp  . ROTATOR CUFF REPAIR Left     Family History  Problem Relation Age of Onset  . Heart disease Mother   . Stroke Mother   . Heart disease Father     Social History:  reports that she quit smoking about 17 years ago. she has never used smokeless tobacco. She reports that she does not drink alcohol or use drugs.  Allergies:  Allergies  Allergen Reactions  . Latex Other (See Comments)    Burning, raw skin from underwear elastic  . Tape Itching    Blisters and tears skin    Medications reviewed.    ROS Full ROS performed and is otherwise negative other than what is stated in HPI   Ht 5\' 11"  (1.803 m)   BMI 31.38 kg/m   Physical Exam  Constitutional:  She is oriented to person, place, and time and well-developed, well-nourished, and in no distress. No distress.  Eyes: Right eye exhibits no discharge. Left eye exhibits no discharge. No scleral icterus.  Neck: No JVD present.  Cardiovascular: Normal rate, regular rhythm and normal heart sounds.  Pulmonary/Chest: Effort normal and breath sounds normal. No stridor. No respiratory distress. She has no wheezes. She has no rales.   Abdominal: Soft. She exhibits no distension. There is no tenderness. There is no rebound.  Musculoskeletal: Normal range of motion. She exhibits no edema.  Neurological: She is alert and oriented to person, place, and time. Gait normal. GCS score is 15.  Skin: Skin is warm and dry. She is not diaphoretic.  Psychiatric: Mood, memory, affect and judgment normal.  Nursing note and vitals reviewed.      Assessment/Plan: Chronic diverticulitis of colon requiring hospitalist a she had multiple rounds of antibiotics. Patient wishes to have elective sigmoid colectomy. Scars with the patient in detail the procedure, risks benefits and possible complications including but not limited to: Bleeding, perforation, and anastomotic leak, injury to adjacent structures. Ureteral injury and prolonged hospitalization among others. She understands and wishes to proceed. We will tentatively schedule for January for lap Sigmoid colectomy.   Caroleen Hamman, MD [facility] General Surgeon

## 2017-08-05 NOTE — Patient Instructions (Signed)
We have discussed removing a portion of your damaged colon through 4 small incisions today. We have scheduled this surgery for 09/21/2017 at [facility] with Dr. Dahlia Byes. Please plan a hospital stay of 3-7 days for surgery and recovery time.  We will have you complete a bowel prep prior to your surgery. Please see information provided. You have also been given a (Blue) Pre-Care Sheet with more information regarding your particular surgery. Please review all information given.  Please call our office with any questions or concerns prior to your scheduled surgery.   Laparoscopic Colectomy Laparoscopic colectomy is surgery to remove part or all of the large intestine (colon). This procedure may be used to treat several conditions, including:  Inflammation and infection of the colon (diverticulitis).  Tumors or masses in the colon.  Inflammatory bowel disease, such as Crohn disease or ulcerative colitis. Colectomy is an option when symptoms cannot be controlled with medicines.  Bleeding from the colon that cannot be controlled by another method.  Blockage or obstruction of the colon.  Tell a health care provider about:  Any allergies you have.  All medicines you are taking, including vitamins, herbs, eye drops, creams, and over-the-counter medicines.  Any problems you or family members have had with anesthetic medicines.  Any blood disorders you have.  Any surgeries you have had.  Any medical conditions you have. What are the risks? Generally, this is a safe procedure. However, problems may occur, including:  Infection.  Bleeding.  Allergic reactions to medicines or dyes.  Damage to other structures or organs.  Leaking from where the colon was sewn together.  Future blockage of the small intestines from scar tissue. Another surgery may be needed to repair this.  Needing to convert to an open procedure. Complications such as damage to other organs or excessive bleeding may require  the surgeon to convert from a laparoscopic procedure to an open procedure. This involves making a larger incision in the abdomen.  What happens before the procedure? Staying hydrated Follow instructions from your health care provider about hydration, which may include:  Up to 2 hours before the procedure - you may continue to drink clear liquids, such as water, clear fruit juice, black coffee, and plain tea.  Eating and drinking restrictions Follow instructions from your health care provider about eating and drinking, which may include:  8 hours before the procedure - stop eating heavy meals, meals with high fiber, or foods such as meat, fried foods, or fatty foods.  6 hours before the procedure - stop eating light meals or foods, such as toast or cereal.  6 hours before the procedure - stop drinking milk or drinks that contain milk.  2 hours before the procedure - stop drinking clear liquids.  Medicines  Ask your health care provider about: ? Changing or stopping your regular medicines. This is especially important if you are taking diabetes medicines or blood thinners. ? Taking medicines such as aspirin and ibuprofen. These medicines can thin your blood. Do not take these medicines before your procedure if your health care provider instructs you not to.  You may be given antibiotic medicine to clean out bacteria from your colon. Follow the directions carefully and take the medicine at the correct time. General instructions  You may be prescribed an oral bowel prep to clean out your colon in preparation for the surgery: ? Follow instructions from your health care provider about how to do this. ? Do not eat or drink anything else after  you have started the bowel prep, unless your health care provider tells you it is safe to do so.  Do not use any products that contain nicotine or tobacco, such as cigarettes and e-cigarettes. If you need help quitting, ask your health care  provider. What happens during the procedure?  To reduce your risk of infection: ? Your health care team will wash or sanitize their hands. ? Your skin will be washed with soap.  An IV tube will be inserted into one of your veins to deliver fluid and medication.  You will be given one of the following: ? A medicine to help you relax (sedative). ? A medicine to make you fall asleep (general anesthetic).  Small monitors will be connected to your body. They will be used to check your heart, blood pressure, and oxygen level.  A breathing tube may be placed into your lungs during the procedure.  A thin, flexible tube (catheter) will be placed into your bladder to drain urine.  A tube may be placed through your nose and into your stomach to drain stomach fluids (nasogastric tube, or NG tube).  Your abdomen will be filled with air so it expands. This gives the surgeon more room to operate and makes your organs easier to see.  Several small cuts (incisions) will be made in your abdomen.  A thin, lighted tube with a tiny camera on the end (laparoscope) will be put through one of the small incisions. The camera on the laparoscope will send a picture to a computer screen in the operating room. This will give the surgeon a good view inside your abdomen.  Hollow tubes will be put through the other small incisions in your abdomen. The tools that are needed for the procedure will be put through these tubes.  Clamps or staples will be put on both ends of the diseased part of the colon.  The part of the intestine between the clamps or staples will be removed.  If possible, the ends of the healthy colon that remain will be stitched (sutured) or stapled together to allow your body to pass waste (stool).  Sometimes, the remaining colon cannot be stitched back together. If this is the case, a colostomy will be needed. If you need a colostomy: ? An opening to the outside of your body (stoma) will be  made through your abdomen. ? The end of your colon will be brought to the opening. It will be stitched to the skin. ? A bag will be attached to the opening. Stool will drain into this removable bag. ? The colostomy may be temporary or permanent.  The incisions from the colectomy will be closed with sutures or staples. The procedure may vary among health care providers and hospitals. What happens after the procedure?  Your blood pressure, heart rate, breathing rate, and blood oxygen level will be monitored until the medicines you were given have worn off.  You will receive fluids through an IV tube until your bowels start to work properly.  Once your bowels are working again, you will be given clear liquids first and then solid food as tolerated.  You will be given medicines to control your pain and nausea, if needed.  Do not drive for 24 hours if you were given a sedative. This information is not intended to replace advice given to you by your health care provider. Make sure you discuss any questions you have with your health care provider. Document Released: 11/01/2002 Document Revised: 05/12/2016  Document Reviewed: 05/12/2016 Elsevier Interactive Patient Education  [employer].     Laparoscopic Colectomy, Care After This sheet gives you information about how to care for yourself after your procedure. Your health care provider may also give you more specific instructions. If you have problems or questions, contact your health care provider. What can I expect after the procedure? After your procedure, it is common to have the following:  Pain in your abdomen, especially in the incision areas. You will be given medicine to control the pain.  Tiredness. This is a normal part of the recovery process. Your energy level will return to normal over the next several weeks.  Changes in your bowel movements, such as constipation or needing to go more often. Talk with your health care  provider about how to manage this.  Follow these instructions at home: Medicines  Take over-the-counter and prescription medicines only as told by your health care provider.  Do not drive or use heavy machinery while taking prescription pain medicine.  Do not drink alcohol while taking prescription pain medicine.  If you were prescribed an antibiotic medicine, use it as told by your health care provider. Do not stop using the antibiotic even if you start to feel better. Incision care  Follow instructions from your health care provider about how to take care of your incision areas. Make sure you: ? Keep your incisions clean and dry. ? Wash your hands with soap and water before and after applying medicine to the areas, and before and after changing your bandage (dressing). If soap and water are not available, use hand sanitizer. ? Change your dressing as told by your health care provider. ? Leave stitches (sutures), skin glue, or adhesive strips in place. These skin closures may need to stay in place for 2 weeks or longer. If adhesive strip edges start to loosen and curl up, you may trim the loose edges. Do not remove adhesive strips completely unless your health care provider tells you to do that.  Do not wear tight clothing over the incisions. Tight clothing may rub and irritate the incision areas, which may cause the incisions to open.  Do not take baths, swim, or use a hot tub until your health care provider approves. Ask your health care provider if you can take showers. You may only be allowed to take sponge baths for bathing.  Check your incision area every day for signs of infection. Check for: ? More redness, swelling, or pain. ? More fluid or blood. ? Warmth. ? Pus or a bad smell. Activity  Avoid lifting anything that is heavier than 10 lb (4.5 kg) for 2 weeks or until your health care provider says it is okay.  You may resume normal activities as told by your health care  provider. Ask your health care provider what activities are safe for you.  Take rest breaks during the day as needed. Eating and drinking  Follow instructions from your health care provider about what you can eat after surgery.  To prevent or treat constipation while you are taking prescription pain medicine, your health care provider may recommend that you: ? Drink enough fluid to keep your urine clear or pale yellow. ? Take over-the-counter or prescription medicines. ? Eat foods that are high in fiber, such as fresh fruits and vegetables, whole grains, and beans. ? Limit foods that are high in fat and processed sugars, such as fried and sweet foods. General instructions  Ask your health care  provider when you will need an appointment to get your sutures or staples removed.  Keep all follow-up visits as told by your health care provider. This is important. Contact a health care provider if:  You have more redness, swelling, or pain around your incisions.  You have more fluid or blood coming from the incisions.  Your incisions feel warm to the touch.  You have pus or a bad smell coming from your incisions or your dressing.  You have a fever.  You have an incision that breaks open (edges not staying together) after sutures or staples have been removed. Get help right away if:  You develop a rash.  You have chest pain or difficulty breathing.  You have pain or swelling in your legs.  You feel light-headed or you faint.  Your abdomen swells (becomes distended).  You have nausea or vomiting.  You have blood in your stool (feces). This information is not intended to replace advice given to you by your health care provider. Make sure you discuss any questions you have with your health care provider. Document Released: 02/28/2005 Document Revised: 05/12/2016 Document Reviewed: 05/12/2016 Elsevier Interactive Patient Education  [employer].

## 2017-08-21 ENCOUNTER — Telehealth: Payer: Self-pay | Admitting: Surgery

## 2017-08-21 NOTE — Telephone Encounter (Signed)
Pt advised of pre op date/time and sx date. Sx: 09/21/17 with Dr Pabon-Laparoscopic sigmoid colectomy.  Pre op: 09/14/17 @ 9:45am--Office interview.   Patient made aware to call [phone], between 1-3:00pm the day before surgery, to find out what time to arrive.

## 2017-09-14 ENCOUNTER — Other Ambulatory Visit: Payer: Self-pay

## 2017-09-14 ENCOUNTER — Encounter
Admission: RE | Admit: 2017-09-14 | Discharge: 2017-09-14 | Disposition: A | Discharge status: Home / Self Care | Payer: 59 | Source: Ambulatory Visit | Attending: Surgery | Admitting: Surgery

## 2017-09-14 DIAGNOSIS — I10 Essential (primary) hypertension: Secondary | ICD-10-CM | POA: Diagnosis not present

## 2017-09-14 DIAGNOSIS — Z01812 Encounter for preprocedural laboratory examination: Secondary | ICD-10-CM | POA: Insufficient documentation

## 2017-09-14 DIAGNOSIS — Z01810 Encounter for preprocedural cardiovascular examination: Secondary | ICD-10-CM | POA: Diagnosis not present

## 2017-09-14 HISTORY — DX: Sleep apnea, unspecified: G47.30

## 2017-09-14 LAB — COMPREHENSIVE METABOLIC PANEL
ALT: 34 U/L (ref 14–54)
AST: 29 U/L (ref 15–41)
Albumin: 4.5 g/dL (ref 3.5–5.0)
Alkaline Phosphatase: 79 U/L (ref 38–126)
Anion gap: 10 (ref 5–15)
BUN: 10 mg/dL (ref 6–20)
CHLORIDE: 102 mmol/L (ref 101–111)
CO2: 27 mmol/L (ref 22–32)
CREATININE: 0.5 mg/dL (ref 0.44–1.00)
Calcium: 9.7 mg/dL (ref 8.9–10.3)
GFR calc Af Amer: >60 mL/min (ref >60)
Glucose, Bld: 132 mg/dL — ABNORMAL HIGH (ref 65–99)
Potassium: 3.7 mmol/L (ref 3.5–5.1)
Sodium: 139 mmol/L (ref 135–145)
Total Bilirubin: 0.7 mg/dL (ref 0.3–1.2)
Total Protein: 7.4 g/dL (ref 6.5–8.1)

## 2017-09-14 LAB — CBC WITH DIFFERENTIAL/PLATELET
BASOS ABS: 0 10*3/uL (ref 0–0.1)
Basophils Relative: 1 %
EOS PCT: 2 %
Eosinophils Absolute: 0.1 10*3/uL (ref 0–0.7)
HCT: 40.7 % (ref 35.0–47.0)
Hemoglobin: 14.2 g/dL (ref 12.0–16.0)
Lymphocytes Relative: 29 %
Lymphs Abs: 1.5 10*3/uL (ref 1.0–3.6)
MCH: 30.4 pg (ref 26.0–34.0)
MCHC: 34.8 g/dL (ref 32.0–36.0)
MCV: 87.3 fL (ref 80.0–100.0)
Monocytes Absolute: 0.4 10*3/uL (ref 0.2–0.9)
Monocytes Relative: 7 %
NEUTROS ABS: 3.3 10*3/uL (ref 1.4–6.5)
NEUTROS PCT: 61 %
PLATELETS: 131 10*3/uL — AB (ref 150–440)
RBC: 4.66 MIL/uL (ref 3.80–5.20)
RDW: 14.1 % (ref 11.5–14.5)
WBC: 5.3 10*3/uL (ref 3.6–11.0)

## 2017-09-14 NOTE — Patient Instructions (Signed)
Your procedure is scheduled on:Mon. 09/21/17 Report to Day Surgery. To find out your arrival time please call [phone] between 1PM - 3PM on Friday 09/18/17.  Remember: Instructions that are not followed completely may result in serious medical risk, up to and including death, or upon the discretion of your surgeon and anesthesiologist your surgery may need to be rescheduled.     _X__ 1. Do not eat food after midnight the night before your procedure.                 No gum chewing or hard candies. You may drink clear liquids up to 2 hours                 before you are scheduled to arrive for your surgery- DO not drink clear                 liquids within 2 hours of the start of your surgery.                 Clear Liquids include:  water, apple juice without pulp, clear carbohydrate                 drink such as Clearfast of Gartorade, Black Coffee or Tea (Do not add                 anything to coffee or tea).     _X__ 2.  No Alcohol for 24 hours before or after surgery.   ___ 3.  Do Not Smoke or use e-cigarettes For 24 Hours Prior to Your Surgery.                 Do not use any chewable tobacco products for at least 6 hours prior to                 surgery.  ____  4.  Bring all medications with you on the day of surgery if instructed.   __x__  5.  Notify your doctor if there is any change in your medical condition      (cold, fever, infections).     Do not wear jewelry, make-up, hairpins, clips or nail polish. Do not wear lotions, powders, or perfumes. You may wear deodorant. Do not shave 48 hours prior to surgery. Men may shave face and neck. Do not bring valuables to the hospital.    [facility] is not responsible for any belongings or valuables.  Contacts, dentures or bridgework may not be worn into surgery. Leave your suitcase in the car. After surgery it may be brought to your room. For patients admitted to the hospital, discharge time is determined  by your treatment team.   Patients discharged the day of surgery will not be allowed to drive home.   Please read over the following fact sheets that you were given:    __x__ Take these medicines the morning of surgery with A SIP OF WATER:    1. ALPRAZolam (XANAX) 0.5 MG tablet  2.busPIRone (BUSPAR) 10 MG tablet   3. gabapentin (NEURONTIN) 300 MG capsule  4.fexofenadine (ALLEGRA) 180 MG tablet  5.esomeprazole (NEXIUM) 20 MG capsule extra dose night before and morning of surgery ____ Fleet Enema (as directed)   _x___ Use CHG Soap as directed  __x__ Use inhalers on the day of surgeryalbuterol (PROVENTIL HFA;VENTOLIN HFA) 108 (90 Base) MCG/ACT inhaler and bring with you to hospital  ____ Stop metformin 2 days prior to surgery  ____ Take 1/2 of usual insulin dose the night before surgery. No insulin the morning          of surgery.   ____ Stop Coumadin/Plavix/aspirin on   _x___ Stop Anti-inflammatories on today .  May take tylenol   ____ Stop supplements until after surgery.    __x__ Bring C-Pap to the hospital. ( into the hospital to be checked)

## 2017-09-20 MED ORDER — SODIUM CHLORIDE 0.9 % IV SOLN
1.0000 g | INTRAVENOUS | Status: AC
  Administered 2017-09-21: 1 g via INTRAVENOUS
  Filled 2017-09-20: qty 1

## 2017-09-21 ENCOUNTER — Inpatient Hospital Stay: Payer: 59

## 2017-09-21 ENCOUNTER — Inpatient Hospital Stay: Payer: 59 | Admitting: Certified Registered Nurse Anesthetist

## 2017-09-21 ENCOUNTER — Inpatient Hospital Stay
Admission: RE | Admit: 2017-09-21 | Discharge: 2017-09-25 | DRG: 329 | Disposition: A | Discharge status: Home / Self Care | Payer: 59 | Source: Ambulatory Visit | Attending: Surgery | Admitting: Surgery

## 2017-09-21 ENCOUNTER — Encounter
Admission: RE | Disposition: A | Discharge status: Home / Self Care | Payer: Self-pay | Source: Ambulatory Visit | Attending: Surgery

## 2017-09-21 DIAGNOSIS — Z9049 Acquired absence of other specified parts of digestive tract: Secondary | ICD-10-CM | POA: Diagnosis not present

## 2017-09-21 DIAGNOSIS — Z978 Presence of other specified devices: Secondary | ICD-10-CM

## 2017-09-21 DIAGNOSIS — Z90710 Acquired absence of both cervix and uterus: Secondary | ICD-10-CM | POA: Diagnosis not present

## 2017-09-21 DIAGNOSIS — Z87442 Personal history of urinary calculi: Secondary | ICD-10-CM

## 2017-09-21 DIAGNOSIS — J9690 Respiratory failure, unspecified, unspecified whether with hypoxia or hypercapnia: Secondary | ICD-10-CM

## 2017-09-21 DIAGNOSIS — F419 Anxiety disorder, unspecified: Secondary | ICD-10-CM | POA: Diagnosis present

## 2017-09-21 DIAGNOSIS — Z8249 Family history of ischemic heart disease and other diseases of the circulatory system: Secondary | ICD-10-CM

## 2017-09-21 DIAGNOSIS — Z79899 Other long term (current) drug therapy: Secondary | ICD-10-CM

## 2017-09-21 DIAGNOSIS — G4733 Obstructive sleep apnea (adult) (pediatric): Secondary | ICD-10-CM | POA: Diagnosis present

## 2017-09-21 DIAGNOSIS — Z91040 Latex allergy status: Secondary | ICD-10-CM | POA: Diagnosis not present

## 2017-09-21 DIAGNOSIS — J45909 Unspecified asthma, uncomplicated: Secondary | ICD-10-CM | POA: Diagnosis present

## 2017-09-21 DIAGNOSIS — N736 Female pelvic peritoneal adhesions (postinfective): Secondary | ICD-10-CM | POA: Diagnosis present

## 2017-09-21 DIAGNOSIS — K55049 Acute infarction of large intestine, extent unspecified: Secondary | ICD-10-CM | POA: Diagnosis present

## 2017-09-21 DIAGNOSIS — J95821 Acute postprocedural respiratory failure: Secondary | ICD-10-CM | POA: Diagnosis not present

## 2017-09-21 DIAGNOSIS — Z91048 Other nonmedicinal substance allergy status: Secondary | ICD-10-CM | POA: Diagnosis not present

## 2017-09-21 DIAGNOSIS — G8918 Other acute postprocedural pain: Secondary | ICD-10-CM | POA: Diagnosis not present

## 2017-09-21 DIAGNOSIS — K5732 Diverticulitis of large intestine without perforation or abscess without bleeding: Secondary | ICD-10-CM | POA: Diagnosis present

## 2017-09-21 DIAGNOSIS — K219 Gastro-esophageal reflux disease without esophagitis: Secondary | ICD-10-CM | POA: Diagnosis present

## 2017-09-21 DIAGNOSIS — I10 Essential (primary) hypertension: Secondary | ICD-10-CM | POA: Diagnosis present

## 2017-09-21 DIAGNOSIS — Z87891 Personal history of nicotine dependence: Secondary | ICD-10-CM | POA: Diagnosis not present

## 2017-09-21 HISTORY — PX: LAPAROSCOPIC SIGMOID COLECTOMY: SHX5928

## 2017-09-21 LAB — GLUCOSE, CAPILLARY: Glucose-Capillary: 213 mg/dL — ABNORMAL HIGH (ref 65–99)

## 2017-09-21 LAB — MRSA PCR SCREENING: MRSA by PCR: NEGATIVE

## 2017-09-21 SURGERY — COLECTOMY, SIGMOID, LAPAROSCOPIC
Anesthesia: General | Wound class: Clean Contaminated

## 2017-09-21 MED ORDER — LIDOCAINE HCL (CARDIAC) 20 MG/ML IV SOLN
INTRAVENOUS | Status: DC | PRN
  Administered 2017-09-21 (×2): 90 mg via INTRAVENOUS
  Administered 2017-09-21: 100 mg via INTRAVENOUS

## 2017-09-21 MED ORDER — MIDAZOLAM HCL 2 MG/2ML IJ SOLN
INTRAMUSCULAR | Status: AC
  Filled 2017-09-21: qty 2

## 2017-09-21 MED ORDER — METOPROLOL TARTRATE 5 MG/5ML IV SOLN: 2.5000 mg | INTRAVENOUS | Status: DC | PRN

## 2017-09-21 MED ORDER — SUGAMMADEX SODIUM 200 MG/2ML IV SOLN
INTRAVENOUS | Status: DC | PRN
  Administered 2017-09-21: 220 mg via INTRAVENOUS

## 2017-09-21 MED ORDER — KETOROLAC TROMETHAMINE 30 MG/ML IJ SOLN
30.0000 mg | Freq: Four times a day (QID) | INTRAMUSCULAR | Status: DC
  Administered 2017-09-21 – 2017-09-22 (×3): 30 mg via INTRAVENOUS
  Filled 2017-09-21 (×3): qty 1

## 2017-09-21 MED ORDER — BUPIVACAINE-EPINEPHRINE (PF) 0.25% -1:200000 IJ SOLN
INTRAMUSCULAR | Status: DC | PRN
  Administered 2017-09-21: 30 mL via PERINEURAL

## 2017-09-21 MED ORDER — ONDANSETRON HCL 4 MG/2ML IJ SOLN: 4.0000 mg | Freq: Four times a day (QID) | INTRAMUSCULAR | Status: DC | PRN

## 2017-09-21 MED ORDER — CHLORHEXIDINE GLUCONATE CLOTH 2 % EX PADS: 6.0000 | MEDICATED_PAD | Freq: Once | CUTANEOUS | Status: DC

## 2017-09-21 MED ORDER — OXYCODONE HCL 5 MG PO TABS: 5.0000 mg | ORAL_TABLET | Freq: Once | ORAL | Status: DC | PRN

## 2017-09-21 MED ORDER — ACETAMINOPHEN 500 MG PO TABS
1000.0000 mg | ORAL_TABLET | ORAL | Status: AC
  Administered 2017-09-21: 1000 mg via ORAL

## 2017-09-21 MED ORDER — PROPOFOL 1000 MG/100ML IV EMUL
0.0000 ug/kg/min | INTRAVENOUS | Status: DC
  Administered 2017-09-21: 25 ug/kg/min via INTRAVENOUS
  Administered 2017-09-21: 50 ug/kg/min via INTRAVENOUS
  Administered 2017-09-21: 30 ug/kg/min via INTRAVENOUS
  Administered 2017-09-22: 40 ug/kg/min via INTRAVENOUS
  Administered 2017-09-22: 30 ug/kg/min via INTRAVENOUS
  Administered 2017-09-22: 40 ug/kg/min via INTRAVENOUS
  Filled 2017-09-21 (×5): qty 100

## 2017-09-21 MED ORDER — FENTANYL CITRATE (PF) 100 MCG/2ML IJ SOLN
INTRAMUSCULAR | Status: AC
  Filled 2017-09-21: qty 2

## 2017-09-21 MED ORDER — LACTATED RINGERS IV SOLN
INTRAVENOUS | Status: DC
  Administered 2017-09-21: 10:00:00 via INTRAVENOUS

## 2017-09-21 MED ORDER — ACETAMINOPHEN 10 MG/ML IV SOLN
1000.0000 mg | Freq: Four times a day (QID) | INTRAVENOUS | Status: AC
  Administered 2017-09-21 – 2017-09-22 (×4): 1000 mg via INTRAVENOUS
  Filled 2017-09-21 (×4): qty 100

## 2017-09-21 MED ORDER — BUPIVACAINE LIPOSOME 1.3 % IJ SUSP
INTRAMUSCULAR | Status: DC | PRN
  Administered 2017-09-21: 20 mL

## 2017-09-21 MED ORDER — MIDAZOLAM BOLUS VIA INFUSION: 2.0000 mg | Freq: Once | INTRAVENOUS | Status: DC

## 2017-09-21 MED ORDER — PANTOPRAZOLE SODIUM 40 MG IV SOLR
40.0000 mg | Freq: Every day | INTRAVENOUS | Status: DC
  Administered 2017-09-21 – 2017-09-22 (×2): 40 mg via INTRAVENOUS
  Filled 2017-09-21 (×2): qty 40

## 2017-09-21 MED ORDER — ALBUTEROL SULFATE HFA 108 (90 BASE) MCG/ACT IN AERS
INHALATION_SPRAY | RESPIRATORY_TRACT | Status: DC | PRN
  Administered 2017-09-21: 3 via RESPIRATORY_TRACT
  Administered 2017-09-21: 4 via RESPIRATORY_TRACT

## 2017-09-21 MED ORDER — ROCURONIUM BROMIDE 100 MG/10ML IV SOLN
INTRAVENOUS | Status: DC | PRN
  Administered 2017-09-21: 20 mg via INTRAVENOUS
  Administered 2017-09-21 (×2): 10 mg via INTRAVENOUS
  Administered 2017-09-21: 50 mg via INTRAVENOUS
  Administered 2017-09-21: 10 mg via INTRAVENOUS
  Administered 2017-09-21: 20 mg via INTRAVENOUS

## 2017-09-21 MED ORDER — FENTANYL CITRATE (PF) 100 MCG/2ML IJ SOLN: 25.0000 ug | INTRAMUSCULAR | Status: DC | PRN

## 2017-09-21 MED ORDER — PROPOFOL 10 MG/ML IV BOLUS
INTRAVENOUS | Status: AC
  Filled 2017-09-21: qty 20

## 2017-09-21 MED ORDER — MIDAZOLAM HCL 2 MG/2ML IJ SOLN
INTRAMUSCULAR | Status: DC | PRN
  Administered 2017-09-21: 2 mg via INTRAVENOUS

## 2017-09-21 MED ORDER — IPRATROPIUM-ALBUTEROL 0.5-2.5 (3) MG/3ML IN SOLN
3.0000 mL | RESPIRATORY_TRACT | Status: DC | PRN
  Administered 2017-09-22: 3 mL via RESPIRATORY_TRACT
  Filled 2017-09-21: qty 3

## 2017-09-21 MED ORDER — ACETAMINOPHEN 500 MG PO TABS
ORAL_TABLET | ORAL | Status: AC
  Administered 2017-09-21: 1000 mg via ORAL
  Filled 2017-09-21: qty 2

## 2017-09-21 MED ORDER — DIPHENHYDRAMINE HCL 50 MG/ML IJ SOLN: 12.5000 mg | Freq: Four times a day (QID) | INTRAMUSCULAR | Status: DC | PRN

## 2017-09-21 MED ORDER — SODIUM CHLORIDE FLUSH 0.9 % IV SOLN
INTRAVENOUS | Status: AC
  Filled 2017-09-21: qty 10

## 2017-09-21 MED ORDER — FENTANYL CITRATE (PF) 100 MCG/2ML IJ SOLN
50.0000 ug | INTRAMUSCULAR | Status: DC | PRN
  Administered 2017-09-21: 50 ug via INTRAVENOUS
  Filled 2017-09-21: qty 2

## 2017-09-21 MED ORDER — HYDRALAZINE HCL 20 MG/ML IJ SOLN: 10.0000 mg | INTRAMUSCULAR | Status: DC | PRN

## 2017-09-21 MED ORDER — ORAL CARE MOUTH RINSE
15.0000 mL | OROMUCOSAL | Status: DC
  Administered 2017-09-21 – 2017-09-22 (×8): 15 mL via OROMUCOSAL

## 2017-09-21 MED ORDER — DEXAMETHASONE SODIUM PHOSPHATE 10 MG/ML IJ SOLN
INTRAMUSCULAR | Status: DC | PRN
  Administered 2017-09-21: 4 mg via INTRAVENOUS

## 2017-09-21 MED ORDER — ONDANSETRON 4 MG PO TBDP
4.0000 mg | ORAL_TABLET | Freq: Four times a day (QID) | ORAL | Status: DC | PRN
  Filled 2017-09-21: qty 1

## 2017-09-21 MED ORDER — KETAMINE HCL 10 MG/ML IJ SOLN
INTRAMUSCULAR | Status: DC | PRN
  Administered 2017-09-21: 40 mg via INTRAVENOUS

## 2017-09-21 MED ORDER — LACTATED RINGERS IV SOLN
INTRAVENOUS | Status: DC | PRN
  Administered 2017-09-21: 11:00:00 via INTRAVENOUS

## 2017-09-21 MED ORDER — MORPHINE SULFATE (PF) 4 MG/ML IV SOLN
2.0000 mg | INTRAVENOUS | Status: DC | PRN
  Administered 2017-09-21 – 2017-09-22 (×3): 2 mg via INTRAVENOUS
  Filled 2017-09-21 (×4): qty 1

## 2017-09-21 MED ORDER — MIDAZOLAM HCL 2 MG/2ML IJ SOLN
INTRAMUSCULAR | Status: AC
  Administered 2017-09-21: 2 mg
  Filled 2017-09-21: qty 2

## 2017-09-21 MED ORDER — SODIUM CHLORIDE 0.9 % IJ SOLN
INTRAMUSCULAR | Status: DC | PRN
  Administered 2017-09-21: 50 mL via INTRAVENOUS

## 2017-09-21 MED ORDER — ALPRAZOLAM 0.5 MG PO TABS: 0.5000 mg | ORAL_TABLET | Freq: Three times a day (TID) | ORAL | Status: DC | PRN

## 2017-09-21 MED ORDER — LACTATED RINGERS IV SOLN
INTRAVENOUS | Status: DC
  Administered 2017-09-21: 17:00:00 via INTRAVENOUS
  Administered 2017-09-22: 100 mL/h via INTRAVENOUS
  Administered 2017-09-22 – 2017-09-23 (×2): via INTRAVENOUS

## 2017-09-21 MED ORDER — OXYCODONE HCL 5 MG/5ML PO SOLN: 5.0000 mg | Freq: Once | ORAL | Status: DC | PRN

## 2017-09-21 MED ORDER — ACETAMINOPHEN 500 MG PO TABS: 1000.0000 mg | ORAL_TABLET | Freq: Four times a day (QID) | ORAL | Status: DC

## 2017-09-21 MED ORDER — PHENYLEPHRINE HCL 10 MG/ML IJ SOLN
INTRAMUSCULAR | Status: DC | PRN
  Administered 2017-09-21: 200 ug via INTRAVENOUS
  Administered 2017-09-21 (×4): 100 ug via INTRAVENOUS

## 2017-09-21 MED ORDER — DIPHENHYDRAMINE HCL 12.5 MG/5ML PO ELIX
12.5000 mg | ORAL_SOLUTION | Freq: Four times a day (QID) | ORAL | Status: DC | PRN
  Filled 2017-09-21: qty 5

## 2017-09-21 MED ORDER — ONDANSETRON HCL 4 MG/2ML IJ SOLN
INTRAMUSCULAR | Status: DC | PRN
  Administered 2017-09-21: 4 mg via INTRAVENOUS

## 2017-09-21 MED ORDER — KETOROLAC TROMETHAMINE 30 MG/ML IJ SOLN
INTRAMUSCULAR | Status: DC | PRN
  Administered 2017-09-21: 30 mg via INTRAVENOUS

## 2017-09-21 MED ORDER — ENOXAPARIN SODIUM 40 MG/0.4ML ~~LOC~~ SOLN: 40.0000 mg | SUBCUTANEOUS | Status: DC

## 2017-09-21 MED ORDER — PROPOFOL 1000 MG/100ML IV EMUL: 5.0000 ug/kg/min | INTRAVENOUS | Status: DC

## 2017-09-21 MED ORDER — ALBUTEROL SULFATE (2.5 MG/3ML) 0.083% IN NEBU
2.5000 mg | INHALATION_SOLUTION | Freq: Four times a day (QID) | RESPIRATORY_TRACT | Status: DC | PRN
Start: 2017-09-21 — End: 2017-09-21

## 2017-09-21 MED ORDER — METHYLPREDNISOLONE SODIUM SUCC 125 MG IJ SOLR
INTRAMUSCULAR | Status: AC
  Administered 2017-09-21: 125 mg
  Filled 2017-09-21: qty 2

## 2017-09-21 MED ORDER — OXYCODONE HCL 5 MG PO TABS
5.0000 mg | ORAL_TABLET | ORAL | Status: DC | PRN
  Administered 2017-09-22: 10 mg via ORAL
  Filled 2017-09-21: qty 2

## 2017-09-21 MED ORDER — KETAMINE HCL 50 MG/ML IJ SOLN
INTRAMUSCULAR | Status: AC
  Filled 2017-09-21: qty 10

## 2017-09-21 MED ORDER — FENTANYL CITRATE (PF) 100 MCG/2ML IJ SOLN
INTRAMUSCULAR | Status: DC | PRN
  Administered 2017-09-21 (×2): 50 ug via INTRAVENOUS
  Administered 2017-09-21: 100 ug via INTRAVENOUS
  Administered 2017-09-21: 25 ug via INTRAVENOUS

## 2017-09-21 MED ORDER — CHLORHEXIDINE GLUCONATE 0.12% ORAL RINSE (MEDLINE KIT)
15.0000 mL | Freq: Two times a day (BID) | OROMUCOSAL | Status: DC
  Administered 2017-09-21 – 2017-09-22 (×2): 15 mL via OROMUCOSAL

## 2017-09-21 MED ORDER — PROPOFOL 10 MG/ML IV BOLUS
INTRAVENOUS | Status: DC | PRN
  Administered 2017-09-21: 150 mg via INTRAVENOUS

## 2017-09-21 SURGICAL SUPPLY — 84 items
ADH SKN CLS APL DERMABOND .7 (GAUZE/BANDAGES/DRESSINGS) ×2
APPLIER CLIP 5 13 M/L LIGAMAX5 (MISCELLANEOUS)
APR CLP MED LRG 5 ANG JAW (MISCELLANEOUS)
BLADE SURG SZ10 CARB STEEL (BLADE) ×3 IMPLANT
BULB RESERV EVAC DRAIN JP 100C (MISCELLANEOUS) ×2 IMPLANT
CANISTER SUCT 1200ML W/VALVE (MISCELLANEOUS) ×3 IMPLANT
CATH ROBINSON RED A/P 16FR (CATHETERS) ×3 IMPLANT
CHLORAPREP W/TINT 26ML (MISCELLANEOUS) ×3 IMPLANT
CLIP APPLIE 5 13 M/L LIGAMAX5 (MISCELLANEOUS) IMPLANT
CNTNR SPEC 2.5X3XGRAD LEK (MISCELLANEOUS) ×1
CONT SPEC 4OZ STER OR WHT (MISCELLANEOUS) ×2
CONT SPEC 4OZ STRL OR WHT (MISCELLANEOUS) ×1
CONTAINER SPEC 2.5X3XGRAD LEK (MISCELLANEOUS) ×1 IMPLANT
DECANTER SPIKE VIAL GLASS SM (MISCELLANEOUS) ×3 IMPLANT
DEFOGGER SCOPE WARMER CLEARIFY (MISCELLANEOUS) ×3 IMPLANT
DERMABOND ADVANCED (GAUZE/BANDAGES/DRESSINGS) ×4
DERMABOND ADVANCED .7 DNX12 (GAUZE/BANDAGES/DRESSINGS) ×2 IMPLANT
DRAIN CHANNEL JP 19F (MISCELLANEOUS) ×3 IMPLANT
DRAPE INCISE IOBAN 66X45 STRL (DRAPES) ×3 IMPLANT
DRAPE LEGGINS SURG 28X43 STRL (DRAPES) ×3 IMPLANT
DRAPE UNDER BUTTOCK W/FLU (DRAPES) ×3 IMPLANT
DRSG OPSITE POSTOP 3X4 (GAUZE/BANDAGES/DRESSINGS) ×4 IMPLANT
DRSG OPSITE POSTOP 4X6 (GAUZE/BANDAGES/DRESSINGS) ×2 IMPLANT
ELECT BLADE 6.5 EXT (BLADE) IMPLANT
ELECT CAUTERY BLADE 6.4 (BLADE) ×3 IMPLANT
ELECT REM PT RETURN 9FT ADLT (ELECTROSURGICAL) ×3
ELECTRODE REM PT RTRN 9FT ADLT (ELECTROSURGICAL) ×1 IMPLANT
GELPORT LAPAROSCOPIC (MISCELLANEOUS) ×2 IMPLANT
GLOVE BIO SURGEON STRL SZ7 (GLOVE) ×2 IMPLANT
GLOVE BIOGEL PI IND STRL 7.5 (GLOVE) IMPLANT
GLOVE BIOGEL PI INDICATOR 7.5 (GLOVE) ×16
GLOVE PROTEXIS LATEX SZ 7.5 (GLOVE) ×24 IMPLANT
GLOVE SURG LATEX 7.5 PF (GLOVE) IMPLANT
GOWN STRL REUS W/ TWL LRG LVL3 (GOWN DISPOSABLE) ×4 IMPLANT
GOWN STRL REUS W/TWL LRG LVL3 (GOWN DISPOSABLE) ×18
HANDLE SUCTION POOLE (INSTRUMENTS) ×1 IMPLANT
HANDLE YANKAUER SUCT BULB TIP (MISCELLANEOUS) ×3 IMPLANT
HOLDER FOLEY CATH W/STRAP (MISCELLANEOUS) ×3 IMPLANT
IRRIGATION STRYKERFLOW (MISCELLANEOUS) ×1 IMPLANT
IRRIGATOR STRYKERFLOW (MISCELLANEOUS) ×3
IV NS 1000ML (IV SOLUTION) ×3
IV NS 1000ML BAXH (IV SOLUTION) ×1 IMPLANT
KIT PINK PAD W/HEAD ARE REST (MISCELLANEOUS) ×3
KIT PINK PAD W/HEAD ARM REST (MISCELLANEOUS) ×1 IMPLANT
KIT TURNOVER CYSTO (KITS) ×3 IMPLANT
L-HOOK LAP DISP 36CM (ELECTROSURGICAL) ×3
LHOOK LAP DISP 36CM (ELECTROSURGICAL) IMPLANT
NEEDLE HYPO 22GX1.5 SAFETY (NEEDLE) ×5 IMPLANT
NS IRRIG 1000ML POUR BTL (IV SOLUTION) ×3 IMPLANT
PACK COLON CLEAN CLOSURE (MISCELLANEOUS) ×3 IMPLANT
PACK LAP CHOLECYSTECTOMY (MISCELLANEOUS) ×3 IMPLANT
PAD PREP 24X41 OB/GYN DISP (PERSONAL CARE ITEMS) ×3 IMPLANT
PENCIL ELECTRO HAND CTR (MISCELLANEOUS) ×3 IMPLANT
RELOAD STAPLE 60 2.6 WHT THN (STAPLE) IMPLANT
RELOAD STAPLE 60 3.6 BLU REG (STAPLE) IMPLANT
RELOAD STAPLER BLUE 60MM (STAPLE) ×3 IMPLANT
RELOAD STAPLER WHITE 60MM (STAPLE) ×3 IMPLANT
SCISSORS METZENBAUM CVD 33 (INSTRUMENTS) IMPLANT
SHEARS HARMONIC ACE PLUS 36CM (ENDOMECHANICALS) ×3 IMPLANT
SLEEVE ENDOPATH XCEL 5M (ENDOMECHANICALS) ×3 IMPLANT
SOL PREP PVP 2OZ (MISCELLANEOUS) ×3
SOLUTION PREP PVP 2OZ (MISCELLANEOUS) ×1 IMPLANT
SPONGE LAP 18X18 5 PK (GAUZE/BANDAGES/DRESSINGS) ×12 IMPLANT
STAPLE ECHEON FLEX 60 POW ENDO (STAPLE) IMPLANT
STAPLER CIRCULAR 29MM (STAPLE) ×2 IMPLANT
STAPLER RELOAD BLUE 60MM (STAPLE) ×9
STAPLER RELOAD WHITE 60MM (STAPLE) ×9
STAPLER SKIN PROX 35W (STAPLE) ×2 IMPLANT
SUCT SIGMOIDOSCOPE TIP 18 W/TU (SUCTIONS) IMPLANT
SUCTION POOLE HANDLE (INSTRUMENTS) ×3
SURGILUBE 2OZ TUBE FLIPTOP (MISCELLANEOUS) ×1 IMPLANT
SUT ETHILON 3-0 FS-10 30 BLK (SUTURE) ×3
SUT MNCRL AB 4-0 PS2 18 (SUTURE) ×4 IMPLANT
SUT PDS AB 0 CT1 27 (SUTURE) ×6 IMPLANT
SUT SILK 0 SH 30 (SUTURE) IMPLANT
SUT SILK 2 0SH CR/8 30 (SUTURE) ×3 IMPLANT
SUTURE EHLN 3-0 FS-10 30 BLK (SUTURE) IMPLANT
SYR 30ML LL (SYRINGE) ×6 IMPLANT
SYRINGE IRR TOOMEY STRL 70CC (SYRINGE) ×3 IMPLANT
TOWEL OR 17X26 4PK STRL BLUE (TOWEL DISPOSABLE) ×3 IMPLANT
TRAY FOLEY W/METER SILVER 16FR (SET/KITS/TRAYS/PACK) ×3 IMPLANT
TROCAR XCEL 12X100 BLDLESS (ENDOMECHANICALS) ×3 IMPLANT
TROCAR XCEL NON-BLD 5MMX100MML (ENDOMECHANICALS) ×3 IMPLANT
TUBING INSUF HEATED (TUBING) ×3 IMPLANT

## 2017-09-21 NOTE — Anesthesia Post-op Follow-up Note (Addendum)
Anesthesia QCDR form completed.        

## 2017-09-21 NOTE — Anesthesia Postprocedure Evaluation (Signed)
Anesthesia Post Note  Patient: MARCUS GROLL  Procedure(s) Performed: LAPAROSCOPIC SIGMOID COLECTOMY (N/A )  Patient location during evaluation: PACU Anesthesia Type: General Level of consciousness: awake and alert Pain management: pain level controlled Vital Signs Assessment: post-procedure vital signs reviewed and stable Respiratory status: spontaneous breathing, respiratory function stable and patient connected to nasal cannula oxygen Cardiovascular status: blood pressure returned to baseline and stable Postop Assessment: no apparent nausea or vomiting Anesthetic complications: yes Anesthetic complication details: required intubationComments: See note     Last Vitals:  Vitals:   09/21/17 1609 09/21/17 1619  BP: 133/79 (!) 142/82  Pulse: 86 82  Resp: (!) 23   Temp:    SpO2: 92% 97%    Last Pain:  Vitals:   09/21/17 1536  TempSrc:   PainSc: Asleep                 Molli Barrows

## 2017-09-21 NOTE — H&P (Signed)
Chief Complaint  Patient presents with  . Follow-up    Diverticulitis- discuss Colonoscopy results    HPI: Recurrent diverticulitis requiring hospitalization with IV antibiotics as well as multiple outpatient treatments. Patient is very interested in elective sigmoid colectomy. She did complete a colonoscopy with a small TA on the right colon. She does have some bloating and some intermittent mild lower abdominal discomfort. No fevers no chills diverticulitis episode has resolved. Is able to perform more than 4 Mets without any shortness of breath or chest pain. Her latest creatinine, PL and hemoglobin are stable and adequate. Hx abd hysterectomy      Past Medical History:  Diagnosis Date  . Abdominal pain   . Acid reflux 01/04/2014  . Acute respiratory failure with hypoxemia ([place]) 09/20/2014  . Anemia   . Anxiety   . Asthma   . Benign neoplasm of sigmoid colon   . Billowing mitral valve   . Calculus of kidney 02/07/2013  . Complication of anesthesia    pneumonia after anesthesia  . Depression   . Essential (primary) hypertension 01/10/2015  . GERD (gastroesophageal reflux disease)   . Hypertension   . Incomplete emptying of bladder 07/05/2014  . Kidney stones   . PVC (premature ventricular contraction)   . Sciatica of left side   . Septic shock ([place]) 2015  . Sigmoid diverticulitis 01/27/2017  . Tachycardia 04/17/2014  . Thrombocytopenia ([place]) 09/20/2014         Past Surgical History:  Procedure Laterality Date  . ABDOMINAL HYSTERECTOMY    . AUGMENTATION MAMMAPLASTY  2008  . COLONOSCOPY WITH PROPOFOL N/A 03/14/2016   Procedure: COLONOSCOPY WITH PROPOFOL;  Surgeon: Lucilla Lame, MD;  Location: [place];  Service: Endoscopy;  Laterality: N/A;  . COLONOSCOPY WITH PROPOFOL N/A 07/27/2017   Procedure: COLONOSCOPY WITH Biopsy;  Surgeon: Lucilla Lame, MD;  Location: [place];  Service: Endoscopy;  Laterality: N/A;  .  ESOPHAGOGASTRODUODENOSCOPY (EGD) WITH PROPOFOL N/A 03/14/2016   Procedure: ESOPHAGOGASTRODUODENOSCOPY (EGD) WITH PROPOFOL;  Surgeon: Lucilla Lame, MD;  Location: [place];  Service: Endoscopy;  Laterality: N/A;  . FLEXOR TENDON REPAIR Left 08/04/13   Dr. Elvina Mattes, [facility]  . KIDNEY STONE SURGERY  2015  . POLYPECTOMY  03/14/2016   Procedure: POLYPECTOMY;  Surgeon: Lucilla Lame, MD;  Location: [place];  Service: Endoscopy;;  . POLYPECTOMY  07/27/2017   Procedure: POLYPECTOMY INTESTINAL;  Surgeon: Lucilla Lame, MD;  Location: [place];  Service: Endoscopy;;  Ascending colon polyp  . ROTATOR CUFF REPAIR Left          Family History  Problem Relation Age of Onset  . Heart disease Mother   . Stroke Mother   . Heart disease Father     Social History:  reports that she quit smoking about 17 years ago. she has never used smokeless tobacco. She reports that she does not drink alcohol or use drugs.  Allergies:       Allergies  Allergen Reactions  . Latex Other (See Comments)    Burning, raw skin from underwear elastic  . Tape Itching    Blisters and tears skin    Medications reviewed.    ROS Full ROS performed and is otherwise negative other than what is stated in HPI   Ht 5\' 11"  (1.803 m)   BMI 31.38 kg/m   Physical Exam  Constitutional: She is oriented to person, place, and time and well-developed, well-nourished, and in no distress. No distress.  Eyes: Right eye  exhibits no discharge. Left eye exhibits no discharge. No scleral icterus.  Neck: No JVD present.  Cardiovascular: Normal rate, regular rhythm and normal heart sounds.  Pulmonary/Chest: Effort normal and breath sounds normal. No stridor. No respiratory distress. She has no wheezes. She has no rales.  Abdominal: Soft. She exhibits no distension. There is no tenderness. There is no rebound.  Musculoskeletal: Normal range of motion. She exhibits no edema.   Neurological: She is alert and oriented to person, place, and time. Gait normal. GCS score is 15.  Skin: Skin is warm and dry. She is not diaphoretic.  Psychiatric: Mood, memory, affect and judgment normal.  Nursing note and vitals reviewed.     Assessment/Plan: Chronic diverticulitis of colon requiring hospitalist a she had multiple rounds of antibiotics. Patient wishes to have elective sigmoid colectomy. Scars with the patient in detail the procedure, risks benefits and possible complications including but not limited to: Bleeding, perforation, and anastomotic leak, injury to adjacent structures. Ureteral injury and prolonged hospitalization among others. She understands and wishes to proceed. Plan for lap Sigmoid colectomy.   Caroleen Hamman, MD [facility] General Surgeon

## 2017-09-21 NOTE — Consult Note (Signed)
PULMONARY / CRITICAL CARE MEDICINE   Name: Grace Hester MRN: [id number] DOB: 03-23-1961    ADMISSION DATE:  09/21/2017  PT PROFILE:   20 F underwent elective laparoscopic sigmoid colectomy for recurrent diverticulitis.  Required reintubation in PACU for hypercarbic respiratory failure.  Has a history of similar postop complication in the past.   HPI: As above.  Level 5 caveat  PAST MEDICAL HISTORY :  She  has a past medical history of Abdominal pain, Acid reflux (01/04/2014), Acute respiratory failure with hypoxemia (HCC) (09/20/2014), Anemia, Anxiety, Asthma, Benign neoplasm of sigmoid colon, Billowing mitral valve, Calculus of kidney (4/62/7035), Complication of anesthesia, Depression, Essential (primary) hypertension (01/10/2015), GERD (gastroesophageal reflux disease), Hypertension, Incomplete emptying of bladder (07/05/2014), Kidney stones, PVC (premature ventricular contraction), Sciatica of left side, Septic shock ([place]) (2015), Sigmoid diverticulitis (01/27/2017), Sleep apnea, Tachycardia (04/17/2014), and Thrombocytopenia ([place]) (09/20/2014).  PAST SURGICAL HISTORY: She  has a past surgical history that includes Augmentation mammaplasty (2008); Abdominal hysterectomy; Rotator cuff repair (Left); Flexor tendon repair (Left, 08/04/13); Kidney stone surgery (2015); Colonoscopy with propofol (N/A, 03/14/2016); Esophagogastroduodenoscopy (egd) with propofol (N/A, 03/14/2016); polypectomy (03/14/2016); Colonoscopy with propofol (N/A, 07/27/2017); Polypectomy (07/27/2017); Image guided sinus surgery; and Ganglion cyst excision (Left).  Allergies  Allergen Reactions  . Latex Other (See Comments)    Burning, raw skin from underwear elastic  . Tape Itching and Other (See Comments)    Blisters and tears skin    No current facility-administered medications on file prior to encounter.    Current Outpatient Medications on File Prior to Encounter  Medication Sig  . albuterol (PROVENTIL HFA;VENTOLIN  HFA) 108 (90 Base) MCG/ACT inhaler Inhale 1-2 puffs into the lungs every 6 (six) hours as needed for wheezing or shortness of breath.  . ALPRAZolam (XANAX) 0.5 MG tablet TAKE 1 TABLET BY MOUTH 3 TIMES A DAY AS NEEDED FOR ANXIETY  . busPIRone (BUSPAR) 10 MG tablet Take 10 mg by mouth 2 (two) times daily.  . Calcipotriene-Betameth Diprop (ENSTILAR) 0.005-0.064 % FOAM Apply 1 application topically daily.  Marland Kitchen esomeprazole (NEXIUM) 20 MG capsule Take 20 mg by mouth every morning.   Marland Kitchen EUCRISA 2 % OINT Apply 1 application topically at bedtime.   . fexofenadine (ALLEGRA) 180 MG tablet Take 180 mg by mouth daily.   Marland Kitchen gabapentin (NEURONTIN) 300 MG capsule Take 600-900 mg by mouth 2 (two) times daily. Take 2 cap qam, and 3 caps qhs  . hydrochlorothiazide (HYDRODIURIL) 12.5 MG tablet Take 12.5 mg by mouth daily.   . metoprolol succinate (TOPROL-XL) 50 MG 24 hr tablet Take 50 mg by mouth daily.   . sertraline (ZOLOFT) 100 MG tablet Take 200 mg by mouth at bedtime.     FAMILY HISTORY:  Her indicated that the status of her mother is unknown. She indicated that the status of her father is unknown.   SOCIAL HISTORY: She  reports that she quit smoking about 17 years ago. she has never used smokeless tobacco. She reports that she does not drink alcohol or use drugs.  REVIEW OF SYSTEMS:   Level 5 caveat  SUBJECTIVE:    VITAL SIGNS: BP (!) 143/86   Pulse 82   Temp 97.6 F (36.4 C) (Axillary)   Resp 15   Ht 5\' 11"  (1.803 m)   Wt 100.7 kg (222 lb 0.1 oz)   SpO2 96%   BMI 30.96 kg/m   HEMODYNAMICS:    VENTILATOR SETTINGS: Vent Mode: PRVC FiO2 (%):  [50 %] 50 % Set Rate:  [  14 bmp] 14 bmp Vt Set:  [500 mL] 500 mL PEEP:  [5 cmH20] 5 cmH20  INTAKE / OUTPUT: No intake/output data recorded.  PHYSICAL EXAMINATION: General: Intubated, sedated on propofol Neuro: CNs intact, moves all extremities HEENT: NCAT, sclerae white, oropharynx normal Cardiovascular: Regular, no M Lungs: Clear anteriorly  without wheezes or other adventitious sounds Abdomen: Postop dressing noted, soft, diminished to absent BS Extremities: Warm, no edema  LABS:  BMET No results for input(s): NA, K, CL, CO2, BUN, CREATININE, GLUCOSE in the last 168 hours.  Electrolytes No results for input(s): CALCIUM, MG, PHOS in the last 168 hours.  CBC No results for input(s): WBC, HGB, HCT, PLT in the last 168 hours.  Coag's No results for input(s): APTT, INR in the last 168 hours.  Sepsis Markers No results for input(s): LATICACIDVEN, PROCALCITON, O2SATVEN in the last 168 hours.  ABG Recent Labs  Lab 09/21/17 1621  PHART 7.28*  PCO2ART 52*  PO2ART 70*    Liver Enzymes No results for input(s): AST, ALT, ALKPHOS, BILITOT, ALBUMIN in the last 168 hours.  Cardiac Enzymes No results for input(s): TROPONINI, PROBNP in the last 168 hours.  Glucose No results for input(s): GLUCAP in the last 168 hours.  Imaging X-ray Chest Pa Or Ap  Result Date: 09/21/2017 CLINICAL DATA:  Endotracheal intubation. EXAM: CHEST 1 VIEW COMPARISON:  10/25/2015. FINDINGS: Cardiomegaly. Low lung volumes. Chronic midlung zone scarring on the RIGHT. ET tube 4 cm above carina. No consolidation or edema. No pneumothorax. Worsening aeration from priors. IMPRESSION: Cardiomegaly. Low lung volumes. No consolidation or edema. ET tube satisfactory position. Electronically Signed   By: Staci Righter M.D.   On: 09/21/2017 16:44     ASSESSMENT / PLAN: Status post laparoscopic sigmoid colectomy for recurrent diverticulitis Post op respiratory failure with hypercapnea likely due to prolonged anesthetic effect History of OSA History of asthma, no bronchospasm presently  Vent settings established Vent bundle implemented Nebulized bronchodilators as needed for wheezing or shortness of breath Will leave intubated overnight with likely extubation 1/29 Sedation with propofol and intermittent fentanyl DVT prophylaxis Stress ulcer  prophylaxis   Merton Border, MD PCCM service Mobile [phone] Pager [phone] 09/21/2017 5:56 PM  09/21/2017, 5:50 PM

## 2017-09-21 NOTE — Progress Notes (Signed)
Dr. Andree Elk administered 50mg  of Propofol at 1627.

## 2017-09-21 NOTE — Transfer of Care (Signed)
Immediate Anesthesia Transfer of Care Note  Patient: Grace Hester  Procedure(s) Performed: LAPAROSCOPIC SIGMOID COLECTOMY (N/A )  Patient Location: PACU  Anesthesia Type:General  Level of Consciousness: sedated and responds to stimulation  Airway & Oxygen Therapy: Patient Spontanous Breathing and Patient connected to nasal cannula oxygen  Post-op Assessment: Report given to RN and Post -op Vital signs reviewed and stable  Post vital signs: Reviewed and stable  Last Vitals:  Vitals:   09/21/17 0928 09/21/17 1536  BP: 132/87 132/84  Pulse: 83 87  Resp: 17 18  Temp: 36.8 C   SpO2: 97% 92%    Last Pain:  Vitals:   09/21/17 0928  TempSrc: Oral         Complications: No apparent anesthesia complications

## 2017-09-21 NOTE — Anesthesia Preprocedure Evaluation (Signed)
Anesthesia Evaluation  Patient identified by MRN, date of birth, ID band Patient awake    Reviewed: Allergy & Precautions, H&P , NPO status , Patient's Chart, lab work & pertinent test results  History of Anesthesia Complications (+) history of anesthetic complications  Airway Mallampati: III  TM Distance: <3 FB Neck ROM: limited    Dental  (+) Chipped   Pulmonary asthma , sleep apnea and Continuous Positive Airway Pressure Ventilation , pneumonia, former smoker,           Cardiovascular Exercise Tolerance: Good hypertension, (-) angina(-) Past MI and (-) DOE      Neuro/Psych PSYCHIATRIC DISORDERS Anxiety Depression  Neuromuscular disease    GI/Hepatic Neg liver ROS, GERD  ,  Endo/Other  negative endocrine ROS  Renal/GU Renal disease     Musculoskeletal   Abdominal   Peds  Hematology negative hematology ROS (+)   Anesthesia Other Findings Past Medical History: No date: Abdominal pain 01/04/2014: Acid reflux 09/20/2014: Acute respiratory failure with hypoxemia (HCC) No date: Anemia No date: Anxiety No date: Asthma No date: Benign neoplasm of sigmoid colon No date: Billowing mitral valve 02/07/2013: Calculus of kidney No date: Complication of anesthesia     Comment:  pneumonia after anesthesia No date: Depression 01/10/2015: Essential (primary) hypertension No date: GERD (gastroesophageal reflux disease) No date: Hypertension 07/05/2014: Incomplete emptying of bladder No date: Kidney stones No date: PVC (premature ventricular contraction) No date: Sciatica of left side 2015: Septic shock ([place]) 01/27/2017: Sigmoid diverticulitis No date: Sleep apnea     Comment:  CPAP 04/17/2014: Tachycardia 09/20/2014: Thrombocytopenia ([place])  Past Surgical History: No date: ABDOMINAL HYSTERECTOMY 2008: AUGMENTATION MAMMAPLASTY 03/14/2016: COLONOSCOPY WITH PROPOFOL; N/A     Comment:  Procedure: COLONOSCOPY WITH PROPOFOL;   Surgeon: Lucilla Lame, MD;  Location: [place];  Service:               Endoscopy;  Laterality: N/A; 07/27/2017: COLONOSCOPY WITH PROPOFOL; N/A     Comment:  Procedure: COLONOSCOPY WITH Biopsy;  Surgeon: Lucilla Lame, MD;  Location: [place];  Service:               Endoscopy;  Laterality: N/A; 03/14/2016: ESOPHAGOGASTRODUODENOSCOPY (EGD) WITH PROPOFOL; N/A     Comment:  Procedure: ESOPHAGOGASTRODUODENOSCOPY (EGD) WITH               PROPOFOL;  Surgeon: Lucilla Lame, MD;  Location: [place];  Service: Endoscopy;  Laterality: N/A; 08/04/13: FLEXOR TENDON REPAIR; Left     Comment:  Dr. Elvina Mattes, [facility] No date: [place]; Left No date: IMAGE GUIDED SINUS SURGERY 2015: KIDNEY STONE SURGERY 03/14/2016: POLYPECTOMY     Comment:  Procedure: POLYPECTOMY;  Surgeon: Lucilla Lame, MD;                Location: [place];  Service: Endoscopy;; 07/27/2017: POLYPECTOMY     Comment:  Procedure: POLYPECTOMY INTESTINAL;  Surgeon: Lucilla Lame, MD;  Location: [place];  Service:               Endoscopy;;  Ascending colon polyp No date: ROTATOR CUFF REPAIR; Left     Reproductive/Obstetrics negative OB  ROS                             Anesthesia Physical Anesthesia Plan  ASA: III  Anesthesia Plan: General ETT   Post-op Pain Management:    Induction: Intravenous  PONV Risk Score and Plan: Ondansetron, Dexamethasone and Midazolam  Airway Management Planned: Oral ETT  Additional Equipment:   Intra-op Plan:   Post-operative Plan: Extubation in OR  Informed Consent: I have reviewed the patients History and Physical, chart, labs and discussed the procedure including the risks, benefits and alternatives for the proposed anesthesia with the patient or authorized representative who has indicated his/her understanding and acceptance.   Dental Advisory  Given  Plan Discussed with: Anesthesiologist, CRNA and Surgeon  Anesthesia Plan Comments: (Patient consented for risks of anesthesia including but not limited to:  - adverse reactions to medications - damage to teeth, lips or other oral mucosa - sore throat or hoarseness - Damage to heart, brain, lungs or loss of life  Patient voiced understanding.)        Anesthesia Quick Evaluation

## 2017-09-21 NOTE — Pre-Procedure Instructions (Signed)
Shallow respirations called Dr Andree Elk.  Sent cardiopulmonary down for BIPAP.  Bagged and Dr Andree Elk called to PACU stat.  Patient becomes more responsive and taking deeper breaths.  Placed back on CPAP with 6 liters of oxygen.

## 2017-09-21 NOTE — Pre-Procedure Instructions (Signed)
Dr Andree Elk bagging medication given by Dr Randa Lynn.  Intubated 7.5 ET tube, 23 at the teeth.  Placed on ventilator. ET tube secured.

## 2017-09-21 NOTE — Anesthesia Procedure Notes (Signed)
Procedure Name: Intubation Date/Time: 09/21/2017 11:00 AM Performed by: Willette Alma, CRNA Pre-anesthesia Checklist: Patient identified, Patient being monitored, Timeout performed, Emergency Drugs available and Suction available Patient Re-evaluated:Patient Re-evaluated prior to induction Oxygen Delivery Method: Circle system utilized Preoxygenation: Pre-oxygenation with 100% oxygen Induction Type: IV induction Ventilation: Mask ventilation without difficulty Laryngoscope Size: 3 and McGraph Grade View: Grade I Tube type: Oral Tube size: 7.5 mm Number of attempts: 1 Airway Equipment and Method: Stylet Placement Confirmation: ETT inserted through vocal cords under direct vision,  positive ETCO2 and breath sounds checked- equal and bilateral Secured at: 21 cm Tube secured with: Tape Dental Injury: Teeth and Oropharynx as per pre-operative assessment

## 2017-09-21 NOTE — Op Note (Addendum)
PROCEDURES: 1. Laparoscopic lysis of adhesions 2. Laparoscopic Low anterior resection 3. Laparoscopic takedown of splenic flexure 4. Partial Omental resection  Pre-operative Diagnosis: Recurrent diverticulitis, morbid obesisty  Post-operative Diagnosis: same  Surgeon: Jules Husbands   Assistants: Dr. Genevive Bi. Second saline was needed due to the complexity of the procedure. Dr. Genevive Bi specifically was required for [place] to do the anastomosis and extensive lysis of adhesions.  Anesthesia: General endotracheal anesthesia  ASA Class: 2   Surgeon: Caroleen Hamman , MD FACS  Anesthesia: Gen. with endotracheal tube  Findings: Adhesive disease from the colon to pelvic wall, omentum to abdominal wall  Estimated Blood Loss: 75-100c         Drains: 19 blake drain pelvis         Specimens: colon        Complications: none               Condition: stable  Procedure Details  The patient was seen again in the Holding Room. The benefits, complications, treatment options, and expected outcomes were discussed with the patient. The risks of bleeding, infection, recurrence of symptoms, failure to resolve symptoms,  bowel injury, any of which could require further surgery were reviewed with the patient.   The patient was taken to Operating Room, identified as BETTE BRIENZA and the procedure verified.  A Time Out was held and the above information confirmed.  Prior to the induction of general anesthesia, antibiotic prophylaxis was administered. VTE prophylaxis was in place. General endotracheal anesthesia was then administered and tolerated well. After the induction, the abdomen was prepped with Chloraprep and draped in the sterile fashion. The patient was positioned in the supine position. Prior to the induction of general anesthesia, antibiotic prophylaxis was administered. VTE prophylaxis was in place. General endotracheal anesthesia was then administered and tolerated well. After the induction, the  abdomen was prepped with Chloraprep and draped in the sterile fashion. The patient was positioned in lithotomy position. 7 cm incision was created as a midline mini laparotomy. The abdominal cavity was entered under direct visualization and the GelPort device was placed. A 5 mm port was placed in the suprapubic area under direct visualization and pneumoperitoneum was obtained. There were dense adhesions from the omentum to the abdominal wall that where lysed in the standard fashion with the Harmonic scalpel. We also were able to place a 12 mm port in the right lower quadrant and a 5 mm port in the left lower quadrant under direct visualization. There was significant adhesive disease in the pelvis from the sigmoid to the pelvic wall and also from the sigmoid to the ovary and the uterus. This adhesions were lysed with a combination of finger fracturing and Harmonic scalpel. The white line of Toldt was identified and divided, we mobilized the descending colon IN a lateral to medial fashion. We preserved the ureter at all times. We were also able to mobilize the splenic flexure using Harmonic scalpel in the standard fashion. We identified the takeoff of the inferior mesenteric artery dissected the pedicle and divided using a 60 mm vascular echelon stapler in the standard fashion. Using the Harmonic's scalpel were able to divide the mesorectum and  divided proximal to the mesentery of the descending colon. We mobilize the rectum to have an adequate Margin of resection, there was significant diverticulosis at rectosigmoid junction and we needed to  Obtain an adequate distal margin. Once we have an adequate visualization and mobilization we divided the sigmoid colon distally  below rectosigmoid area with multiple blue loads using the echelon stapler. We'll remove the GelPort and visualized the colon in a direct fashion. An divided at the mid descending colon with standard 60 mm blue load. We opened the stopped and  measure the diameter of the bowel. A 29 mm dilator was perfect size. A pursestring was used after in certain the anvil device. Please note that upon separating the greater omentum from the transverse colon there was significant area of hemorrhagic necrosis within the greater omentum.  Using the harmonic scalpel we will perform a partial greater omentectomy in the standard fashion. Dr. Genevive Bi was able to pass a 29 mm standard EEA stapler device through the anus and we had a little bit of difficulty but were able to finally pass the device through the end of the rectal stump. Under direct visualization we perform an end to end anastomosis with the EEA device. A leak test was performed inflating the colon with a [place] syringe and a rubber catheter. No evidence of leak was observed. There was also adequate hemostasis. A 19 Blake drain was placed in the pelvis. We were able to mobilize the omentum and I created an omental flap to attach it to the anastomosis. The drain was sutured in place with a 2-0 silk. All the laparoscopic ports were removed and a second look showed no evidence of any bleeding or any other injuries. We changed gloves and place a new tray to close the abdomen with a 0 PDS suture in a running fashion and the skin was closed with staples. Liposomal Marcaine was injected on all incision sites under direct visualization. Dermabond was used to coat all the skin incisions. Needle and laparotomy count were correct and there were no immediate complications.  Caroleen Hamman, MD, FACS

## 2017-09-22 ENCOUNTER — Inpatient Hospital Stay: Payer: 59

## 2017-09-22 ENCOUNTER — Telehealth: Payer: Self-pay

## 2017-09-22 ENCOUNTER — Encounter: Payer: Self-pay | Admitting: Surgery

## 2017-09-22 DIAGNOSIS — G8918 Other acute postprocedural pain: Secondary | ICD-10-CM

## 2017-09-22 LAB — BASIC METABOLIC PANEL
Anion gap: 10 (ref 5–15)
BUN: 13 mg/dL (ref 6–20)
CHLORIDE: 105 mmol/L (ref 101–111)
CO2: 24 mmol/L (ref 22–32)
Calcium: 9.2 mg/dL (ref 8.9–10.3)
Creatinine, Ser: 0.75 mg/dL (ref 0.44–1.00)
GFR calc Af Amer: >60 mL/min (ref >60)
GLUCOSE: 206 mg/dL — AB (ref 65–99)
POTASSIUM: 3.8 mmol/L (ref 3.5–5.1)
SODIUM: 139 mmol/L (ref 135–145)

## 2017-09-22 LAB — CBC
HCT: 39.8 % (ref 35.0–47.0)
Hemoglobin: 13.6 g/dL (ref 12.0–16.0)
MCH: 30.1 pg (ref 26.0–34.0)
MCHC: 34.1 g/dL (ref 32.0–36.0)
MCV: 88.5 fL (ref 80.0–100.0)
Platelets: 148 10*3/uL — ABNORMAL LOW (ref 150–440)
RBC: 4.5 MIL/uL (ref 3.80–5.20)
RDW: 14.3 % (ref 11.5–14.5)
WBC: 11.1 10*3/uL — ABNORMAL HIGH (ref 3.6–11.0)

## 2017-09-22 LAB — TRIGLYCERIDES: Triglycerides: 231 mg/dL — ABNORMAL HIGH

## 2017-09-22 MED ORDER — MORPHINE SULFATE 2 MG/ML IV SOLN
INTRAVENOUS | Status: DC
  Administered 2017-09-22: 10 mg via INTRAVENOUS
  Administered 2017-09-22: 3 mg via INTRAVENOUS
  Administered 2017-09-22: 14:00:00 via INTRAVENOUS
  Administered 2017-09-23: 6 mg via INTRAVENOUS
  Administered 2017-09-23: 19 mg via INTRAVENOUS
  Administered 2017-09-23: 01:00:00 via INTRAVENOUS
  Filled 2017-09-22 (×4): qty 30

## 2017-09-22 MED ORDER — ACETAMINOPHEN 500 MG PO TABS
1000.0000 mg | ORAL_TABLET | Freq: Four times a day (QID) | ORAL | Status: DC
  Administered 2017-09-22 – 2017-09-25 (×10): 1000 mg via ORAL
  Filled 2017-09-22 (×11): qty 2

## 2017-09-22 MED ORDER — GABAPENTIN 600 MG PO TABS
300.0000 mg | ORAL_TABLET | Freq: Three times a day (TID) | ORAL | Status: DC
Start: 2017-09-22 — End: 2017-09-22
  Administered 2017-09-22: 300 mg via ORAL
  Filled 2017-09-22 (×2): qty 0.5

## 2017-09-22 MED ORDER — OXYCODONE HCL 5 MG PO TABS
10.0000 mg | ORAL_TABLET | ORAL | Status: DC | PRN
  Administered 2017-09-23 – 2017-09-25 (×5): 10 mg via ORAL
  Filled 2017-09-22 (×5): qty 2

## 2017-09-22 MED ORDER — KETOROLAC TROMETHAMINE 30 MG/ML IJ SOLN
30.0000 mg | Freq: Four times a day (QID) | INTRAMUSCULAR | Status: DC
  Administered 2017-09-22 – 2017-09-23 (×5): 30 mg via INTRAVENOUS
  Filled 2017-09-22 (×5): qty 1

## 2017-09-22 MED ORDER — SODIUM CHLORIDE 0.9% FLUSH: 9.0000 mL | INTRAVENOUS | Status: DC | PRN

## 2017-09-22 MED ORDER — GABAPENTIN 300 MG PO CAPS
300.0000 mg | ORAL_CAPSULE | Freq: Three times a day (TID) | ORAL | Status: DC
  Administered 2017-09-22 – 2017-09-23 (×4): 300 mg via ORAL
  Filled 2017-09-22 (×3): qty 1

## 2017-09-22 MED ORDER — NALOXONE HCL 0.4 MG/ML IJ SOLN: 0.4000 mg | INTRAMUSCULAR | Status: DC | PRN

## 2017-09-22 MED ORDER — DIPHENHYDRAMINE HCL 12.5 MG/5ML PO ELIX
12.5000 mg | ORAL_SOLUTION | Freq: Four times a day (QID) | ORAL | Status: DC | PRN
  Filled 2017-09-22: qty 5

## 2017-09-22 MED ORDER — ONDANSETRON HCL 4 MG/2ML IJ SOLN: 4.0000 mg | Freq: Four times a day (QID) | INTRAMUSCULAR | Status: DC | PRN

## 2017-09-22 MED ORDER — DIPHENHYDRAMINE HCL 50 MG/ML IJ SOLN: 12.5000 mg | Freq: Four times a day (QID) | INTRAMUSCULAR | Status: DC | PRN

## 2017-09-22 MED ORDER — KETOROLAC TROMETHAMINE 30 MG/ML IJ SOLN: 15.0000 mg | Freq: Four times a day (QID) | INTRAMUSCULAR | Status: DC

## 2017-09-22 NOTE — Progress Notes (Signed)
Placed on high fowlers position, cuff deflated, suctioned orally and endotracheally and then extubated to 4 lpm O2  

## 2017-09-22 NOTE — Progress Notes (Signed)
PULMONARY / CRITICAL CARE MEDICINE   Name: Grace Hester MRN: [id number] DOB: 08/02/1961    ADMISSION DATE:  09/21/2017  PT PROFILE:   35 F underwent elective laparoscopic sigmoid colectomy for recurrent diverticulitis.  Required reintubation in PACU for hypercarbic respiratory failure.  Has a history of similar postop complication in the past.  HPI: As above.   PAST MEDICAL HISTORY :  She  has a past medical history of Abdominal pain, Acid reflux (01/04/2014), Acute respiratory failure with hypoxemia ([place]) (09/20/2014), Anemia, Anxiety, Asthma, Benign neoplasm of sigmoid colon, Billowing mitral valve, Calculus of kidney (2/45/8099), Complication of anesthesia, Depression, Essential (primary) hypertension (01/10/2015), GERD (gastroesophageal reflux disease), Hypertension, Incomplete emptying of bladder (07/05/2014), Kidney stones, PVC (premature ventricular contraction), Sciatica of left side, Septic shock ([place]) (2015), Sigmoid diverticulitis (01/27/2017), Sleep apnea, Tachycardia (04/17/2014), and Thrombocytopenia ([place]) (09/20/2014).  PAST SURGICAL HISTORY: She  has a past surgical history that includes Augmentation mammaplasty (2008); Abdominal hysterectomy; Rotator cuff repair (Left); Flexor tendon repair (Left, 08/04/13); Kidney stone surgery (2015); Colonoscopy with propofol (N/A, 03/14/2016); Esophagogastroduodenoscopy (egd) with propofol (N/A, 03/14/2016); polypectomy (03/14/2016); Colonoscopy with propofol (N/A, 07/27/2017); Polypectomy (07/27/2017); Image guided sinus surgery; Ganglion cyst excision (Left); and Laparoscopic sigmoid colectomy (N/A, 09/21/2017).  Allergies  Allergen Reactions  . Latex Other (See Comments)    Burning, raw skin from underwear elastic  . Tape Itching and Other (See Comments)    Blisters and tears skin    No current facility-administered medications on file prior to encounter.    Current Outpatient Medications on File Prior to Encounter  Medication Sig  .  albuterol (PROVENTIL HFA;VENTOLIN HFA) 108 (90 Base) MCG/ACT inhaler Inhale 1-2 puffs into the lungs every 6 (six) hours as needed for wheezing or shortness of breath.  . ALPRAZolam (XANAX) 0.5 MG tablet TAKE 1 TABLET BY MOUTH 3 TIMES A DAY AS NEEDED FOR ANXIETY  . busPIRone (BUSPAR) 10 MG tablet Take 10 mg by mouth 2 (two) times daily.  . Calcipotriene-Betameth Diprop (ENSTILAR) 0.005-0.064 % FOAM Apply 1 application topically daily.  Marland Kitchen esomeprazole (NEXIUM) 20 MG capsule Take 20 mg by mouth every morning.   Marland Kitchen EUCRISA 2 % OINT Apply 1 application topically at bedtime.   . fexofenadine (ALLEGRA) 180 MG tablet Take 180 mg by mouth daily.   Marland Kitchen gabapentin (NEURONTIN) 300 MG capsule Take 600-900 mg by mouth 2 (two) times daily. Take 2 cap qam, and 3 caps qhs  . hydrochlorothiazide (HYDRODIURIL) 12.5 MG tablet Take 12.5 mg by mouth daily.   . metoprolol succinate (TOPROL-XL) 50 MG 24 hr tablet Take 50 mg by mouth daily.   . sertraline (ZOLOFT) 100 MG tablet Take 200 mg by mouth at bedtime.     FAMILY HISTORY:  Her indicated that the status of her mother is unknown. She indicated that the status of her father is unknown.   SOCIAL HISTORY: She  reports that she quit smoking about 17 years ago. she has never used smokeless tobacco. She reports that she does not drink alcohol or use drugs.  REVIEW OF SYSTEMS:   Constitutional negative for fever HEENT negative for rhinorrhea Pulm negative for dyspnea, cough CV negative for cp, palpitations GI positive for abd pain (at surgical site) negative for nausea GU negative for dysuria MSK negative for myalgia Neuro negative for headache, dizziness  SUBJECTIVE:  Doing much better, extubated this morning without complication. Pain well-controlled, pt prefers non-narcotic analgesia  VITAL SIGNS: BP 119/72   Pulse 93   Temp 97.6 F (  36.4 C) (Axillary)   Resp (!) 26   Ht 5\' 11"  (1.803 m)   Wt 100.7 kg (222 lb 0.1 oz)   SpO2 96%   BMI 30.96 kg/m    VENTILATOR SETTINGS: Vent Mode: PSV FiO2 (%):  [35 %-50 %] 35 % Set Rate:  [14 bmp-15 bmp] 15 bmp Vt Set:  [500 mL] 500 mL PEEP:  [5 cmH20] 5 cmH20 Pressure Support:  [5 cmH20] 5 cmH20 Plateau Pressure:  [14 cmH20-16 cmH20] 15 cmH20  INTAKE / OUTPUT: I/O last 3 completed shifts: In: 2941.2 [I.V.:2741.2; IV Piggyback:200] Out: 35 [Urine:875; Drains:30; Blood:75]  PHYSICAL EXAMINATION: General: non-distressed Neuro: CNs intact, moves all extremities HEENT: NCAT, sclerae white, oropharynx normal Cardiovascular: Regular, no M Lungs: Clear anteriorly without wheezes or other adventitious sounds Abdomen: Postop dressing noted, soft, diminished to absent BS Extremities: Warm, no edema  LABS:  BMET Recent Labs  Lab 09/22/17 0506  NA 139  K 3.8  CL 105  CO2 24  BUN 13  CREATININE 0.75  GLUCOSE 206*    Electrolytes Recent Labs  Lab 09/22/17 0506  CALCIUM 9.2    CBC Recent Labs  Lab 09/22/17 0506  WBC 11.1*  HGB 13.6  HCT 39.8  PLT 148*    Coag's No results for input(s): APTT, INR in the last 168 hours.  Sepsis Markers No results for input(s): LATICACIDVEN, PROCALCITON, O2SATVEN in the last 168 hours.  ABG Recent Labs  Lab 09/21/17 1621  PHART 7.28*  PCO2ART 52*  PO2ART 70*    Liver Enzymes No results for input(s): AST, ALT, ALKPHOS, BILITOT, ALBUMIN in the last 168 hours.  Cardiac Enzymes No results for input(s): TROPONINI, PROBNP in the last 168 hours.  Glucose Recent Labs  Lab 09/21/17 1705  GLUCAP 213*    Imaging X-ray Chest Pa Or Ap  Result Date: 09/21/2017 CLINICAL DATA:  Endotracheal intubation. EXAM: CHEST 1 VIEW COMPARISON:  10/25/2015. FINDINGS: Cardiomegaly. Low lung volumes. Chronic midlung zone scarring on the RIGHT. ET tube 4 cm above carina. No consolidation or edema. No pneumothorax. Worsening aeration from priors. IMPRESSION: Cardiomegaly. Low lung volumes. No consolidation or edema. ET tube satisfactory position.  Electronically Signed   By: Staci Righter M.D.   On: 09/21/2017 16:44   Dg Chest Port 1 View  Result Date: 09/22/2017 CLINICAL DATA:  57 year old female postoperative day 1 laparoscopic sigmoid colectomy due to recurrent diverticulitis. Required re-intubation in the PACU. Respiratory failure. EXAM: PORTABLE CHEST 1 VIEW COMPARISON:  09/21/2017 and earlier. FINDINGS: Portable AP semi upright view at 0530 hours. Stable endotracheal tube tip just above the clavicles. Stable lung volumes. Mediastinal contours remain normal. Continued streaky and confluent bilateral perihilar opacity, more pronounced at the medial left lung base. No superimposed pneumothorax, pulmonary edema or pleural effusion. IMPRESSION: 1. Stable endotracheal tube.  Stable lung volumes. 2. Stable ventilation since yesterday with bilateral perihilar atelectasis. No pulmonary edema. No pleural effusion is evident. Electronically Signed   By: Genevie Ann M.D.   On: 09/22/2017 07:29   ASSESSMENT / PLAN: Status post laparoscopic sigmoid colectomy for recurrent diverticulitis Post op respiratory failure with hypercapnea likely due to prolonged anesthetic effect History of OSA History of asthma, no bronchospasm presently  Extubated this morning Supplemental O2 PRN Nebulized bronchodilators as needed for wheezing or shortness of breath DVT prophylaxis Stress ulcer prophylaxis Morphine PCA pump for pain control  Pt is sufficiently stabilized for transfer to floor  Audery Amel, PA-S 09/22/2017 1:48 PM  09/22/2017, 1:48 PM  PCCM ATTENDING ATTESTATION:  I have evaluated patient with PA student Merrilee Jansky reviewed database in its entirety and discussed care plan in detail. In addition, this patient was discussed on multidisciplinary rounds.   Important exam findings: Extubated this AM Tolerating extubation well Cognition intact Few scattered wheezes RRR Abd mildly and diffusely tender Diminished BS Ext warm, no edema No  focal neuro deficits  Labs and CXR reviewed Mild RLL atx on CXR  Major problems addressed by PCCM team: Post op respiratory failure H/O asthma Post op pain  PLAN/REC: Extubated under my supervision this AM Advance activity as tolerated Advance diet when able per Surgery Cont nebulized bronchodilators as needed PCA morphine (discussed with Dr Dahlia Byes) Possible transfer to [place] later in day if tolerating extubation well   Merton Border, MD PCCM service Mobile [phone] Pager [phone] 09/22/2017 2:54 PM

## 2017-09-22 NOTE — Progress Notes (Signed)
POD # 1 resp failure hypercapnia Extubated this am 30cc drain AVSS Hb ok Good UO  PE NAD Somnolent from recent extubation Abd: soft, incisions c/d/i, no infection, serosanguinous fluid JP  A/P Pulm toilet Clears today Xfer to floor tomorrow since she still has some resp issues From surgical perspective doing very well

## 2017-09-22 NOTE — Telephone Encounter (Signed)
Disability paperwork is completed and faxed to [place]  [phone]. Patient notified. Placed in scan folder.

## 2017-09-22 NOTE — Progress Notes (Signed)
   09/22/17 1115  Clinical Encounter Type  Visited With Patient and family together  Visit Type Initial  Referral From Chaplain  Spiritual Encounters  Spiritual Needs Emotional   Patient and family appreciative of check in but declined visit.  Patient would like for chaplain to continue holding her in prayer.

## 2017-09-23 ENCOUNTER — Other Ambulatory Visit: Payer: Self-pay

## 2017-09-23 DIAGNOSIS — K55049 Acute infarction of large intestine, extent unspecified: Secondary | ICD-10-CM

## 2017-09-23 DIAGNOSIS — K5732 Diverticulitis of large intestine without perforation or abscess without bleeding: Principal | ICD-10-CM

## 2017-09-23 LAB — CBC
HCT: 34.5 % — ABNORMAL LOW (ref 35.0–47.0)
HEMOGLOBIN: 12 g/dL (ref 12.0–16.0)
MCH: 30.9 pg (ref 26.0–34.0)
MCHC: 34.7 g/dL (ref 32.0–36.0)
MCV: 89.1 fL (ref 80.0–100.0)
Platelets: 111 10*3/uL — ABNORMAL LOW (ref 150–440)
RBC: 3.87 MIL/uL (ref 3.80–5.20)
RDW: 14.2 % (ref 11.5–14.5)
WBC: 6.9 10*3/uL (ref 3.6–11.0)

## 2017-09-23 LAB — BASIC METABOLIC PANEL
Anion gap: 5 (ref 5–15)
BUN: 15 mg/dL (ref 6–20)
CALCIUM: 8.6 mg/dL — AB (ref 8.9–10.3)
CHLORIDE: 104 mmol/L (ref 101–111)
CO2: 29 mmol/L (ref 22–32)
CREATININE: 0.67 mg/dL (ref 0.44–1.00)
GFR calc non Af Amer: >60 mL/min (ref >60)
Glucose, Bld: 106 mg/dL — ABNORMAL HIGH (ref 65–99)
Potassium: 3.2 mmol/L — ABNORMAL LOW (ref 3.5–5.1)
SODIUM: 138 mmol/L (ref 135–145)

## 2017-09-23 LAB — MAGNESIUM: MAGNESIUM: 2.1 mg/dL (ref 1.7–2.4)

## 2017-09-23 LAB — PHOSPHORUS: Phosphorus: 2.4 mg/dL — ABNORMAL LOW (ref 2.5–4.6)

## 2017-09-23 MED ORDER — MORPHINE SULFATE (PF) 2 MG/ML IV SOLN: 2.0000 mg | INTRAVENOUS | Status: DC | PRN

## 2017-09-23 MED ORDER — PANTOPRAZOLE SODIUM 40 MG PO TBEC
40.0000 mg | DELAYED_RELEASE_TABLET | Freq: Every day | ORAL | Status: DC
  Administered 2017-09-23 – 2017-09-24 (×2): 40 mg via ORAL
  Filled 2017-09-23 (×2): qty 1

## 2017-09-23 MED ORDER — POTASSIUM PHOSPHATES 15 MMOLE/5ML IV SOLN
30.0000 mmol | Freq: Once | INTRAVENOUS | Status: AC
  Administered 2017-09-23: 30 mmol via INTRAVENOUS
  Filled 2017-09-23: qty 10

## 2017-09-23 MED ORDER — SODIUM CHLORIDE 0.9 % IV BOLUS (SEPSIS)
500.0000 mL | Freq: Once | INTRAVENOUS | Status: AC
  Administered 2017-09-23: 500 mL via INTRAVENOUS

## 2017-09-23 NOTE — Progress Notes (Signed)
POD # 2 Doing well resp improving AVSS Labs ok + flatus and taking clears  PE NAD Abd: soft, incisions c/d/i, no infection , serosanguinous fluid JP  A/p doing well xfer to floor Full liquids

## 2017-09-23 NOTE — Progress Notes (Signed)
MD aware of dizziness. MD ordered 500 cc saline bolus.

## 2017-09-23 NOTE — Progress Notes (Signed)
PHARMACIST - PHYSICIAN COMMUNICATION  CONCERNING: IV to Oral Route Change Policy  RECOMMENDATION: This patient is receiving pantoprazole by the intravenous route.  Based on criteria approved by the Pharmacy and Therapeutics Committee, the intravenous medication(s) is/are being converted to the equivalent oral dose form(s).   DESCRIPTION: These criteria include:  The patient is eating (either orally or via tube) and/or has been taking other orally administered medications for a least 24 hours  The patient has no evidence of active gastrointestinal bleeding or impaired GI absorption (gastrectomy, short bowel, patient on TNA or NPO).  If you have questions about this conversion, please contact the Pharmacy Department  []   [phone] )  [facility] [x]   [phone] )  [facility] []   [phone] )  [facility] []   [phone] )  [facility] []   [phone] )  [place], [facility] 09/23/2017 9:27 AM

## 2017-09-23 NOTE — Progress Notes (Signed)
Discussed with Dr Dahlia Byes. Pt to be transferred to [place]. PCCM will sign off  Merton Border, MD PCCM service Mobile [phone] Pager [phone] 09/23/2017 8:54 AM

## 2017-09-24 LAB — CBC
HCT: 34.3 % — ABNORMAL LOW (ref 35.0–47.0)
Hemoglobin: 11.6 g/dL — ABNORMAL LOW (ref 12.0–16.0)
MCH: 30 pg (ref 26.0–34.0)
MCHC: 33.9 g/dL (ref 32.0–36.0)
MCV: 88.5 fL (ref 80.0–100.0)
PLATELETS: 136 10*3/uL — AB (ref 150–440)
RBC: 3.87 MIL/uL (ref 3.80–5.20)
RDW: 14.6 % — ABNORMAL HIGH (ref 11.5–14.5)
WBC: 4.9 10*3/uL (ref 3.6–11.0)

## 2017-09-24 LAB — BASIC METABOLIC PANEL
ANION GAP: 11 (ref 5–15)
BUN: 11 mg/dL (ref 6–20)
CHLORIDE: 104 mmol/L (ref 101–111)
CO2: 29 mmol/L (ref 22–32)
Calcium: 9.1 mg/dL (ref 8.9–10.3)
Creatinine, Ser: 0.62 mg/dL (ref 0.44–1.00)
GFR calc Af Amer: >60 mL/min (ref >60)
Glucose, Bld: 98 mg/dL (ref 65–99)
POTASSIUM: 3.4 mmol/L — AB (ref 3.5–5.1)
SODIUM: 144 mmol/L (ref 135–145)

## 2017-09-24 LAB — SURGICAL PATHOLOGY

## 2017-09-24 MED ORDER — GABAPENTIN 300 MG PO CAPS
600.0000 mg | ORAL_CAPSULE | Freq: Every day | ORAL | Status: DC
  Administered 2017-09-24 – 2017-09-25 (×2): 600 mg via ORAL
  Filled 2017-09-24 (×2): qty 2

## 2017-09-24 MED ORDER — GABAPENTIN 300 MG PO CAPS
900.0000 mg | ORAL_CAPSULE | Freq: Every day | ORAL | Status: DC
  Administered 2017-09-24: 900 mg via ORAL
  Filled 2017-09-24: qty 3

## 2017-09-24 NOTE — Progress Notes (Signed)
POD 3 Dizziness resolved, ? Related to toradol Passing gas Taking po  PE NAD Abd: soft, Drain serous fluid, incisions c/d/i  A/p Doing well Advance diet heplock DC 24-48hrs

## 2017-09-25 MED ORDER — OXYCODONE-ACETAMINOPHEN 7.5-325 MG PO TABS: 1.0000 | ORAL_TABLET | ORAL | 0 refills | Status: DC | PRN

## 2017-09-25 NOTE — Discharge Summary (Signed)
Patient ID: Grace Hester MRN: [id number] DOB/AGE: 10-20-1960 57 y.o.  Admit date: 09/21/2017 Discharge date: 09/25/2017   Discharge Diagnoses:  Active Problems:   Diverticulitis of colon without hemorrhage   S/P laparoscopic colectomy   Procedures: lap low anterior resection  Hospital Course: 57 year old female with recurrent episode of diverticulitis and a history of sleep apnea underwent an uneventful laparoscopic low anterior resection.  In PACU the patient had some hypoventilation issues with hypercarbia requiring intubation.  She was transferred to the ICU where she remained in the ventilator overnight.  She was extubated successfully and then transferred to the floor.  From her abdominal perspective no complications and she actually did very well.  Her diet was slowly advanced.  At the time of discharge she was ambulating, tolerating regular diet.  She was having bowel movements and passing gas.  Her vital signs were stable.  Her physical exam showed a female in no acute distress.  Awake and alert.  Abdomen: Soft with staples in place without evidence of infection.  Drain will be removed by RN. Extremities: No edema and well perfused.  Condition at the time of discharge is stable. We will have a follow-up in the office without next week for staple removal  Consults: Critical care  Disposition: 01-Home or Self Care  Discharge Instructions    Call MD for:  difficulty breathing, headache or visual disturbances   Complete by:  As directed    Call MD for:  extreme fatigue   Complete by:  As directed    Call MD for:  hives   Complete by:  As directed    Call MD for:  persistant dizziness or light-headedness   Complete by:  As directed    Call MD for:  persistant nausea and vomiting   Complete by:  As directed    Call MD for:  redness, tenderness, or signs of infection (pain, swelling, redness, odor or green/yellow discharge around incision site)   Complete by:  As directed     Call MD for:  severe uncontrolled pain   Complete by:  As directed    Call MD for:  temperature >100.4   Complete by:  As directed    Diet - low sodium heart healthy   Complete by:  As directed    Discharge instructions   Complete by:  As directed    Shower daily may use ice packs   Increase activity slowly   Complete by:  As directed    Lifting restrictions   Complete by:  As directed    20 lbs x 6 wks     Allergies as of 09/25/2017      Reactions   Latex Other (See Comments)   Burning, raw skin from underwear elastic   Tape Itching, Other (See Comments)   Blisters and tears skin      Medication List    TAKE these medications   albuterol 108 (90 Base) MCG/ACT inhaler Commonly known as:  PROVENTIL HFA;VENTOLIN HFA Inhale 1-2 puffs into the lungs every 6 (six) hours as needed for wheezing or shortness of breath.   ALPRAZolam 0.5 MG tablet Commonly known as:  XANAX TAKE 1 TABLET BY MOUTH 3 TIMES A DAY AS NEEDED FOR ANXIETY   busPIRone 10 MG tablet Commonly known as:  BUSPAR Take 10 mg by mouth 2 (two) times daily.   ENSTILAR 0.005-0.064 % Foam Generic drug:  Calcipotriene-Betameth Diprop Apply 1 application topically daily.   esomeprazole 20 MG capsule Commonly  known as:  NEXIUM Take 20 mg by mouth every morning.   EUCRISA 2 % Oint Generic drug:  Crisaborole Apply 1 application topically at bedtime.   fexofenadine 180 MG tablet Commonly known as:  ALLEGRA Take 180 mg by mouth daily.   gabapentin 300 MG capsule Commonly known as:  NEURONTIN Take 600-900 mg by mouth 2 (two) times daily. Take 2 cap qam, and 3 caps qhs   hydrochlorothiazide 12.5 MG tablet Commonly known as:  HYDRODIURIL Take 12.5 mg by mouth daily.   metoprolol succinate 50 MG 24 hr tablet Commonly known as:  TOPROL-XL Take 50 mg by mouth daily.   oxyCODONE-acetaminophen 7.5-325 MG tablet Commonly known as:  PERCOCET Take 1 tablet by mouth every 4 (four) hours as needed for severe pain.    sertraline 100 MG tablet Commonly known as:  ZOLOFT Take 200 mg by mouth at bedtime.      Follow-up Information    [facility]. Go on 10/01/2017.   Why:  @10am  Contact information: [address] [place]       Jules Husbands, MD. Go on 10/13/2017.   Specialty:  General Surgery Why:  @1 :30pm Contact information: [address] [place] [address] [phone]            Caroleen Hamman, MD FACS

## 2017-09-25 NOTE — Progress Notes (Signed)
Grace Hester to be D/C'd home  per MD order.  Discussed prescriptions and follow up appointments with the patient. Prescriptions given to patient, medication list explained in detail. Pt verbalized understanding.  Allergies as of 09/25/2017      Reactions   Latex Other (See Comments)   Burning, raw skin from underwear elastic   Tape Itching, Other (See Comments)   Blisters and tears skin      Medication List    TAKE these medications   albuterol 108 (90 Base) MCG/ACT inhaler Commonly known as:  PROVENTIL HFA;VENTOLIN HFA Inhale 1-2 puffs into the lungs every 6 (six) hours as needed for wheezing or shortness of breath.   ALPRAZolam 0.5 MG tablet Commonly known as:  XANAX TAKE 1 TABLET BY MOUTH 3 TIMES A DAY AS NEEDED FOR ANXIETY   busPIRone 10 MG tablet Commonly known as:  BUSPAR Take 10 mg by mouth 2 (two) times daily.   ENSTILAR 0.005-0.064 % Foam Generic drug:  Calcipotriene-Betameth Diprop Apply 1 application topically daily.   esomeprazole 20 MG capsule Commonly known as:  NEXIUM Take 20 mg by mouth every morning.   EUCRISA 2 % Oint Generic drug:  Crisaborole Apply 1 application topically at bedtime.   fexofenadine 180 MG tablet Commonly known as:  ALLEGRA Take 180 mg by mouth daily.   gabapentin 300 MG capsule Commonly known as:  NEURONTIN Take 600-900 mg by mouth 2 (two) times daily. Take 2 cap qam, and 3 caps qhs   hydrochlorothiazide 12.5 MG tablet Commonly known as:  HYDRODIURIL Take 12.5 mg by mouth daily.   metoprolol succinate 50 MG 24 hr tablet Commonly known as:  TOPROL-XL Take 50 mg by mouth daily.   oxyCODONE-acetaminophen 7.5-325 MG tablet Commonly known as:  PERCOCET Take 1 tablet by mouth every 4 (four) hours as needed for severe pain.   sertraline 100 MG tablet Commonly known as:  ZOLOFT Take 200 mg by mouth at bedtime.       Vitals:   09/24/17 2043 09/25/17 0428  BP: (!) 155/86 (!) 156/93  Pulse: 83 87  Resp: (!) 22 20   Temp: 99.1 F (37.3 C) 98.6 F (37 C)  SpO2: 94% 96%    Skin clean, dry and intact without evidence of skin break down, no evidence of skin tears noted. IV catheter discontinued intact. Site without signs and symptoms of complications. Dressing and pressure applied. Pt denies pain at this time. No complaints noted.  An After Visit Summary was printed and given to the patient. Patient escorted via [place], and D/C home via private auto.  Grace Hester A

## 2017-09-29 LAB — BLOOD GAS, ARTERIAL
Acid-base deficit: 3.1 mmol/L — ABNORMAL HIGH (ref 0.0–2.0)
Bicarbonate: 24.4 mmol/L (ref 20.0–28.0)
FIO2: 0.4
O2 Saturation: 91.4 %
PEEP: 5 cmH2O
Patient temperature: 37
RATE: 15 resp/min
VT: 500 mL
pCO2 arterial: 52 mmHg — ABNORMAL HIGH (ref 32.0–48.0)
pH, Arterial: 7.28 — ABNORMAL LOW (ref 7.350–7.450)
pO2, Arterial: 70 mmHg — ABNORMAL LOW (ref 83.0–108.0)

## 2017-10-01 ENCOUNTER — Other Ambulatory Visit
Admission: RE | Admit: 2017-10-01 | Discharge: 2017-10-01 | Disposition: A | Discharge status: Home / Self Care | Payer: 59 | Source: Ambulatory Visit | Attending: Surgery | Admitting: Surgery

## 2017-10-01 ENCOUNTER — Ambulatory Visit (INDEPENDENT_AMBULATORY_CARE_PROVIDER_SITE_OTHER): Payer: 59 | Admitting: Surgery

## 2017-10-01 ENCOUNTER — Other Ambulatory Visit: Payer: Self-pay

## 2017-10-01 ENCOUNTER — Encounter: Payer: Self-pay | Admitting: Surgery

## 2017-10-01 VITALS — BP 121/78 | HR 92 | Temp 98.5°F | Ht 71.0 in | Wt 222.8 lb

## 2017-10-01 DIAGNOSIS — K5732 Diverticulitis of large intestine without perforation or abscess without bleeding: Secondary | ICD-10-CM

## 2017-10-01 DIAGNOSIS — R197 Diarrhea, unspecified: Secondary | ICD-10-CM

## 2017-10-01 NOTE — Progress Notes (Signed)
Outpatient postop visit  10/01/2017  Grace Hester is an 57 y.o. female.    Procedure: Laparoscopic sigmoid colectomy by Dr. Dahlia Byes  CC: Multiple loose stools  HPI: This patient status post laparoscopic sigmoid colectomy Patient was doing well and continues to do  well overall but over the last day she started having multiple watery stools which she had not been having earlier in the week.  She denies fevers or chills not had any melena or easy.  She is eating well and feeling well.   Medications reviewed.    Physical Exam:  Ht 5\' 11"  (1.803 m)   BMI 30.96 kg/m     PE: The patient appears quite well vital signs are stable afebrile abdomen is soft nondistended nontympanitic and nontender wounds are clean staples are removed Steri-Strips are placed with benzoin    Assessment/Plan:  Patient doing very well following laparoscopic sigmoid colectomy.  Pathology is reviewed.  Negative for malignancy.  We will check C. difficile at this time due to the patient's change in bowel habits today where she had 3 very watery bowel movements this morning.  She will follow-up in 7-10 days.  Florene Glen, MD, FACS

## 2017-10-01 NOTE — Patient Instructions (Signed)
We will see you back as listed below, However if you have any questions or concerns please give our office a call.

## 2017-10-02 ENCOUNTER — Inpatient Hospital Stay: Admit: 2017-10-02 | Payer: Self-pay

## 2017-10-02 ENCOUNTER — Other Ambulatory Visit
Admission: RE | Admit: 2017-10-02 | Discharge: 2017-10-02 | Disposition: A | Discharge status: Home / Self Care | Payer: 59 | Source: Ambulatory Visit | Attending: Surgery | Admitting: Surgery

## 2017-10-02 DIAGNOSIS — K5732 Diverticulitis of large intestine without perforation or abscess without bleeding: Secondary | ICD-10-CM | POA: Insufficient documentation

## 2017-10-02 DIAGNOSIS — R1030 Lower abdominal pain, unspecified: Secondary | ICD-10-CM

## 2017-10-02 DIAGNOSIS — R197 Diarrhea, unspecified: Secondary | ICD-10-CM

## 2017-10-02 LAB — C DIFFICILE QUICK SCREEN W PCR REFLEX
C DIFFICILE (CDIFF) INTERP: NOT DETECTED
C Diff antigen: NEGATIVE
C Diff toxin: NEGATIVE

## 2017-10-13 ENCOUNTER — Other Ambulatory Visit
Admission: RE | Admit: 2017-10-13 | Discharge: 2017-10-13 | Disposition: A | Discharge status: Home / Self Care | Payer: 59 | Source: Ambulatory Visit | Attending: Surgery | Admitting: Surgery

## 2017-10-13 ENCOUNTER — Ambulatory Visit (INDEPENDENT_AMBULATORY_CARE_PROVIDER_SITE_OTHER): Payer: 59 | Admitting: Surgery

## 2017-10-13 ENCOUNTER — Other Ambulatory Visit: Payer: Self-pay

## 2017-10-13 ENCOUNTER — Encounter: Payer: Self-pay | Admitting: Surgery

## 2017-10-13 DIAGNOSIS — R1084 Generalized abdominal pain: Secondary | ICD-10-CM | POA: Diagnosis present

## 2017-10-13 LAB — COMPREHENSIVE METABOLIC PANEL
ALT: 51 U/L (ref 14–54)
ANION GAP: 7 (ref 5–15)
AST: 39 U/L (ref 15–41)
Albumin: 4.3 g/dL (ref 3.5–5.0)
Alkaline Phosphatase: 85 U/L (ref 38–126)
BILIRUBIN TOTAL: 0.6 mg/dL (ref 0.3–1.2)
BUN: 8 mg/dL (ref 6–20)
CO2: 30 mmol/L (ref 22–32)
Calcium: 9.3 mg/dL (ref 8.9–10.3)
Chloride: 103 mmol/L (ref 101–111)
Creatinine, Ser: 0.5 mg/dL (ref 0.44–1.00)
GFR calc Af Amer: >60 mL/min (ref >60)
Glucose, Bld: 131 mg/dL — ABNORMAL HIGH (ref 65–99)
Potassium: 4.2 mmol/L (ref 3.5–5.1)
Sodium: 140 mmol/L (ref 135–145)
TOTAL PROTEIN: 7.2 g/dL (ref 6.5–8.1)

## 2017-10-13 LAB — CBC WITH DIFFERENTIAL/PLATELET
BASOS PCT: 1 %
Basophils Absolute: 0 10*3/uL (ref 0–0.1)
EOS PCT: 8 %
Eosinophils Absolute: 0.4 10*3/uL (ref 0–0.7)
HEMATOCRIT: 37 % (ref 35.0–47.0)
Hemoglobin: 13.1 g/dL (ref 12.0–16.0)
LYMPHS PCT: 32 %
Lymphs Abs: 1.6 10*3/uL (ref 1.0–3.6)
MCH: 30.8 pg (ref 26.0–34.0)
MCHC: 35.3 g/dL (ref 32.0–36.0)
MCV: 87.2 fL (ref 80.0–100.0)
MONO ABS: 0.4 10*3/uL (ref 0.2–0.9)
MONOS PCT: 8 %
NEUTROS ABS: 2.5 10*3/uL (ref 1.4–6.5)
Neutrophils Relative %: 51 %
PLATELETS: 175 10*3/uL (ref 150–440)
RBC: 4.24 MIL/uL (ref 3.80–5.20)
RDW: 14.6 % — AB (ref 11.5–14.5)
WBC: 5 10*3/uL (ref 3.6–11.0)

## 2017-10-13 MED ORDER — OXYCODONE-ACETAMINOPHEN 5-325 MG PO TABS: 1.0000 | ORAL_TABLET | Freq: Four times a day (QID) | ORAL | 0 refills | Status: DC | PRN

## 2017-10-13 NOTE — Progress Notes (Signed)
S/p lap LAR 3 weeks ago Having some persistent weakness and some abdominal pain She reports she feels that "things are about to fall of her rectum" She continues to have diarrhea + PO, no fevers, no vomiting Overall her pain has improved Apparently only taken very few norcos, I gave her 28 pills and she still has about 3. Already tested for c diff ( neg)  PE NAD Abd: incisions healing well, mild tenderness, no peritonitis or rebound. Rectal: no blood , some hemorrhoids, no masses  A/P persistent abdominal pain and weakness I will order labs, c diff and CT a/P to r/o abscess, infection or leak F/w tomorrow PT is not septic no ned for emergent surgical intervention

## 2017-10-13 NOTE — Patient Instructions (Signed)
We would like for you to stop by the lab today.  We have scheduled your CT scan 10/14/17 @ 8:45 am @ [facility].  Please stop by Radiology today to pick up your prep and instruction sheet.  Please see your appointment time for tomorrow.

## 2017-10-14 ENCOUNTER — Ambulatory Visit (INDEPENDENT_AMBULATORY_CARE_PROVIDER_SITE_OTHER): Payer: 59 | Admitting: Surgery

## 2017-10-14 ENCOUNTER — Ambulatory Visit
Admission: RE | Admit: 2017-10-14 | Discharge: 2017-10-14 | Disposition: A | Discharge status: Home / Self Care | Payer: 59 | Source: Ambulatory Visit | Attending: Surgery | Admitting: Surgery

## 2017-10-14 ENCOUNTER — Encounter: Payer: Self-pay | Admitting: Surgery

## 2017-10-14 VITALS — BP 115/76 | HR 83 | Ht 71.0 in | Wt 228.0 lb

## 2017-10-14 DIAGNOSIS — Z09 Encounter for follow-up examination after completed treatment for conditions other than malignant neoplasm: Secondary | ICD-10-CM

## 2017-10-14 DIAGNOSIS — I700 Atherosclerosis of aorta: Secondary | ICD-10-CM | POA: Diagnosis not present

## 2017-10-14 DIAGNOSIS — K5732 Diverticulitis of large intestine without perforation or abscess without bleeding: Secondary | ICD-10-CM | POA: Insufficient documentation

## 2017-10-14 DIAGNOSIS — R161 Splenomegaly, not elsewhere classified: Secondary | ICD-10-CM | POA: Diagnosis not present

## 2017-10-14 DIAGNOSIS — R1084 Generalized abdominal pain: Secondary | ICD-10-CM | POA: Insufficient documentation

## 2017-10-14 DIAGNOSIS — Z9049 Acquired absence of other specified parts of digestive tract: Secondary | ICD-10-CM | POA: Insufficient documentation

## 2017-10-14 DIAGNOSIS — K6389 Other specified diseases of intestine: Secondary | ICD-10-CM | POA: Insufficient documentation

## 2017-10-14 DIAGNOSIS — K760 Fatty (change of) liver, not elsewhere classified: Secondary | ICD-10-CM | POA: Insufficient documentation

## 2017-10-14 MED ORDER — IOPAMIDOL (ISOVUE-300) INJECTION 61%
100.0000 mL | Freq: Once | INTRAVENOUS | Status: AC | PRN
  Administered 2017-10-14: 100 mL via INTRAVENOUS

## 2017-10-14 MED ORDER — METRONIDAZOLE 500 MG PO TABS: 500.0000 mg | ORAL_TABLET | Freq: Three times a day (TID) | ORAL | 0 refills | Status: AC

## 2017-10-14 MED ORDER — CIPROFLOXACIN HCL 500 MG PO TABS: 500.0000 mg | ORAL_TABLET | Freq: Two times a day (BID) | ORAL | 0 refills | Status: AC

## 2017-10-14 NOTE — Patient Instructions (Addendum)
We have placed you on a combination of Cipro and Flagyl. These medications have been sent to your pharmacy. Take these medications in their entirety. If you are having difficulty with Nausea/Vomiting or GI Distress, do not stop taking these medications; call our office and speak with a nurse  DO NOT [place]! You can have a very serious reaction with these medications and alcohol intake.   Ciprofloxacin tablets What is this medicine? CIPROFLOXACIN (sip roe FLOX a sin) is a quinolone antibiotic. It is used to treat certain kinds of bacterial infections. It will not work for colds, flu, or other viral infections. This medicine may be used for other purposes; ask your health care provider or pharmacist if you have questions. COMMON BRAND NAME(S): Cipro What should I tell my health care provider before I take this medicine? They need to know if you have any of these conditions: -bone problems -history of low levels of potassium in the blood -joint problems -irregular heartbeat -kidney disease -myasthenia gravis -seizures -tendon problems -tingling of the fingers or toes, or other nerve disorder -an unusual or allergic reaction to ciprofloxacin, other antibiotics or medicines, foods, dyes, or preservatives -pregnant or trying to get pregnant -breast-feeding How should I use this medicine? Take this medicine by mouth with a glass of water. Follow the directions on the prescription label. Take your medicine at regular intervals. Do not take your medicine more often than directed. Take all of your medicine as directed even if you think your are better. Do not skip doses or stop your medicine early. You can take this medicine with food or on an empty stomach. It can be taken with a meal that contains dairy or calcium, but do not take it alone with a dairy product, like milk or yogurt or calcium-fortified juice. A special MedGuide will be given to you by the  pharmacist with each prescription and refill. Be sure to read this information carefully each time. Talk to your pediatrician regarding the use of this medicine in children. Special care may be needed. Overdosage: If you think you have taken too much of this medicine contact a poison control center or emergency room at once. NOTE: This medicine is only for you. Do not share this medicine with others. What if I miss a dose? If you miss a dose, take it as soon as you can. If it is almost time for your next dose, take only that dose. Do not take double or extra doses. What may interact with this medicine? Do not take this medicine with any of the following medications: -cisapride -dofetilide -dronedarone -flibanserin -lomitapide -pimozide -thioridazine -tizanidine -ziprasidone This medicine may also interact with the following medications: -antacids -birth control pills -caffeine -certain medicines for diabetes, like glipizide or glyburide -certain medicines that treat or prevent blood clots like warfarin -clozapine -cyclosporine -didanosine (ddI) buffered tablets or powder -duloxetine -lanthanum carbonate -lidocaine -methotrexate -multivitamins -NSAIDS, medicines for pain and inflammation, like ibuprofen or naproxen -olanzapine -omeprazole -other medicines that prolong the QT interval (cause an abnormal heart rhythm) -phenytoin -probenecid -ropinirole -sevelamer -sildenafil -sucralfate -theophylline -zolpidem This list may not describe all possible interactions. Give your health care provider a list of all the medicines, herbs, non-prescription drugs, or dietary supplements you use. Also tell them if you smoke, drink alcohol, or use illegal drugs. Some items may interact with your medicine. What should I watch for while using this medicine? Tell your doctor or health care professional if your symptoms do  not improve. Do not treat diarrhea with over the counter products.  Contact your doctor if you have diarrhea that lasts more than 2 days or if it is severe and watery. You may get drowsy or dizzy. Do not drive, use machinery, or do anything that needs mental alertness until you know how this medicine affects you. Do not stand or sit up quickly, especially if you are an older patient. This reduces the risk of dizzy or fainting spells. This medicine can make you more sensitive to the sun. Keep out of the sun. If you cannot avoid being in the sun, wear protective clothing and use sunscreen. Do not use sun lamps or tanning beds/booths. Avoid antacids, aluminum, calcium, iron, magnesium, and zinc products for 6 hours before and 2 hours after taking a dose of this medicine. What side effects may I notice from receiving this medicine? Side effects that you should report to your doctor or health care professional as soon as possible: -allergic reactions like skin rash or hives, swelling of the face, lips, or tongue -anxious -confusion -depressed mood -diarrhea -fast, irregular heartbeat -hallucination, loss of contact with reality -joint, muscle, or tendon pain or swelling -pain, tingling, numbness in the hands or feet -suicidal thoughts or other mood changes -sunburn -unusually weak or tired Side effects that usually do not require medical attention (report to your doctor or health care professional if they continue or are bothersome): -dry mouth -headache -nausea -trouble sleeping This list may not describe all possible side effects. Call your doctor for medical advice about side effects. You may report side effects to FDA at 1-800-FDA-1088. Where should I keep my medicine? Keep out of the reach of children. Store at room temperature below 30 degrees C (86 degrees F). Keep container tightly closed. Throw away any unused medicine after the expiration date. NOTE: This sheet is a summary. It may not cover all possible information. If you have questions about this  medicine, talk to your doctor, pharmacist, or health care provider.  2018 Elsevier/Gold Standard (2016-03-21 14:42:02)   Metronidazole tablets or capsules What is this medicine? METRONIDAZOLE (me troe NI da zole) is an antiinfective. It is used to treat certain kinds of bacterial and protozoal infections. It will not work for colds, flu, or other viral infections. This medicine may be used for other purposes; ask your health care provider or pharmacist if you have questions. COMMON BRAND NAME(S): Flagyl What should I tell my health care provider before I take this medicine? They need to know if you have any of these conditions: -anemia or other blood disorders -disease of the nervous system -fungal or yeast infection -if you drink alcohol containing drinks -liver disease -seizures -an unusual or allergic reaction to metronidazole, or other medicines, foods, dyes, or preservatives -pregnant or trying to get pregnant -breast-feeding How should I use this medicine? Take this medicine by mouth with a full glass of water. Follow the directions on the prescription label. Take your medicine at regular intervals. Do not take your medicine more often than directed. Take all of your medicine as directed even if you think you are better. Do not skip doses or stop your medicine early. Talk to your pediatrician regarding the use of this medicine in children. Special care may be needed. Overdosage: If you think you have taken too much of this medicine contact a poison control center or emergency room at once. NOTE: This medicine is only for you. Do not share this medicine with others. What  if I miss a dose? If you miss a dose, take it as soon as you can. If it is almost time for your next dose, take only that dose. Do not take double or extra doses. What may interact with this medicine? Do not take this medicine with any of the following medications: -alcohol or any product that contains  alcohol -amprenavir oral solution -cisapride -disulfiram -dofetilide -dronedarone -paclitaxel injection -pimozide -ritonavir oral solution -sertraline oral solution -sulfamethoxazole-trimethoprim injection -thioridazine -ziprasidone This medicine may also interact with the following medications: -birth control pills -cimetidine -lithium -other medicines that prolong the QT interval (cause an abnormal heart rhythm) -phenobarbital -phenytoin -warfarin This list may not describe all possible interactions. Give your health care provider a list of all the medicines, herbs, non-prescription drugs, or dietary supplements you use. Also tell them if you smoke, drink alcohol, or use illegal drugs. Some items may interact with your medicine. What should I watch for while using this medicine? Tell your doctor or health care professional if your symptoms do not improve or if they get worse. You may get drowsy or dizzy. Do not drive, use machinery, or do anything that needs mental alertness until you know how this medicine affects you. Do not stand or sit up quickly, especially if you are an older patient. This reduces the risk of dizzy or fainting spells. Avoid alcoholic drinks while you are taking this medicine and for three days afterward. Alcohol may make you feel dizzy, sick, or flushed. If you are being treated for a sexually transmitted disease, avoid sexual contact until you have finished your treatment. Your sexual partner may also need treatment. What side effects may I notice from receiving this medicine? Side effects that you should report to your doctor or health care professional as soon as possible: -allergic reactions like skin rash or hives, swelling of the face, lips, or tongue -confusion, clumsiness -difficulty speaking -discolored or sore mouth -dizziness -fever, infection -numbness, tingling, pain or weakness in the hands or feet -trouble passing urine or change in the  amount of urine -redness, blistering, peeling or loosening of the skin, including inside the mouth -seizures -unusually weak or tired -vaginal irritation, dryness, or discharge Side effects that usually do not require medical attention (report to your doctor or health care professional if they continue or are bothersome): -diarrhea -headache -irritability -metallic taste -nausea -stomach pain or cramps -trouble sleeping This list may not describe all possible side effects. Call your doctor for medical advice about side effects. You may report side effects to FDA at 1-800-FDA-1088. Where should I keep my medicine? Keep out of the reach of children. Store at room temperature below 25 degrees C (77 degrees F). Protect from light. Keep container tightly closed. Throw away any unused medicine after the expiration date. NOTE: This sheet is a summary. It may not cover all possible information. If you have questions about this medicine, talk to your doctor, pharmacist, or health care provider.  2018 Elsevier/Gold Standard (2013-03-18 14:08:39)    We will see you back as listed below:

## 2017-10-14 NOTE — Progress Notes (Signed)
Seen after CT. Evidence of small foci of diverticulitis. I reviewed her path and we removed 16 cms sigmoid colon w active disease. Anastomosis is intact. She is better today Cbc and bmp nml  PE NAD Abd: soft, mild ttp , no peritonitis, wound healing well, no infection  A/P doing well Diverticulitis will treat cipro and flagyl RTC 2 weeks

## 2017-11-04 ENCOUNTER — Encounter: Payer: Self-pay | Admitting: Surgery

## 2017-11-04 ENCOUNTER — Ambulatory Visit (INDEPENDENT_AMBULATORY_CARE_PROVIDER_SITE_OTHER): Payer: 59 | Admitting: Surgery

## 2017-11-04 VITALS — BP 158/84 | HR 90 | Temp 98.2°F | Ht 71.0 in | Wt 224.0 lb

## 2017-11-04 DIAGNOSIS — Z09 Encounter for follow-up examination after completed treatment for conditions other than malignant neoplasm: Secondary | ICD-10-CM

## 2017-11-04 NOTE — Patient Instructions (Signed)
When diarrhea occurs you may want to take I imodium tablet to see if this helps. Please increase your fiber in your daily diet.   Please see your follow up appointment l;isted below.

## 2017-11-04 NOTE — Progress Notes (Signed)
S/p sigmoid colectomy Doing well HAs had multiple soft bm No fevers or chills Overall improving + PO  PE NAD Abd: soft, incisions healing well, no infection, no peritonitis  A/P Doing well May take imodium prn to decrease transit RTC 2 months

## 2018-01-13 ENCOUNTER — Ambulatory Visit: Payer: Self-pay | Admitting: Surgery

## 2018-05-19 ENCOUNTER — Other Ambulatory Visit: Payer: Self-pay | Admitting: Neurology

## 2018-05-19 DIAGNOSIS — R55 Syncope and collapse: Secondary | ICD-10-CM

## 2018-05-26 IMAGING — CT CT ABD-PELV W/ CM
2 of 4 series · 16 of 46 positions shown, 18 images · IV contrast (iopamidol)
Comparison: CT abdomen pelvis 06/10/2017.

CLINICAL DATA: Patient with lower abdominal pain, nausea, vomiting
and diarrhea.

EXAM:
CT ABDOMEN AND PELVIS WITH CONTRAST
TECHNIQUE: Multidetector CT imaging of the abdomen and pelvis was performed
using the standard protocol following bolus administration of
intravenous contrast.
CONTRAST:  100mL [id number] IOPAMIDOL ([id number]) INJECTION 61%

[Series 2: routine abd/pel with · axial · 0.76mm/px · z∈[-425,+65]mm · 13 of 108 slices shown, 15 images]
[im 5/108  soft-tissue]
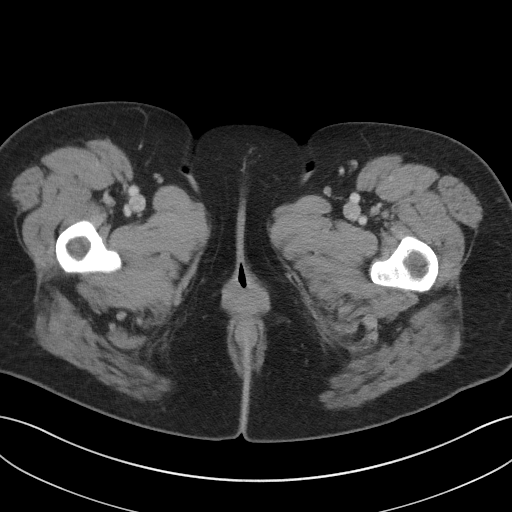
[im 5/108  bone]
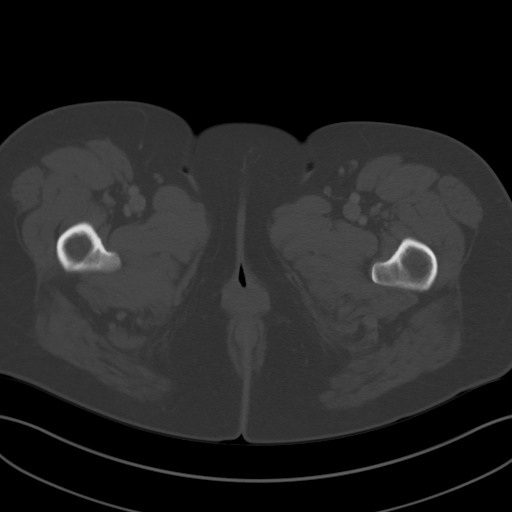
[im 14/108  soft-tissue]
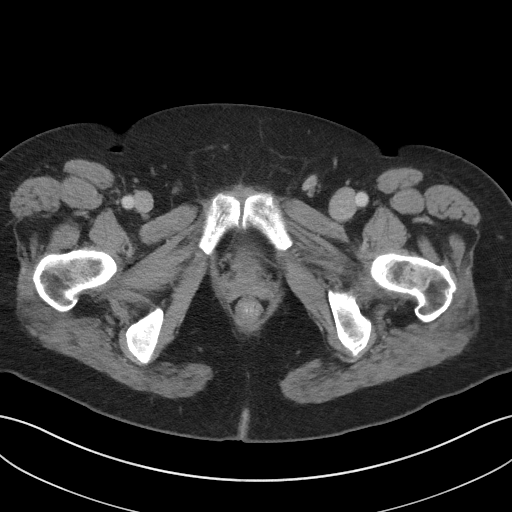
[im 23/108  soft-tissue]
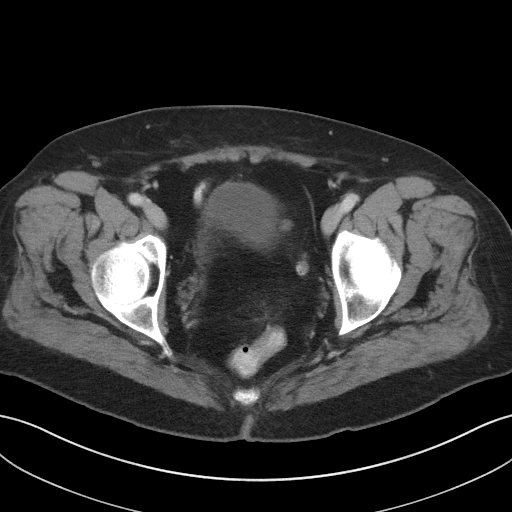
[im 32/108  soft-tissue]
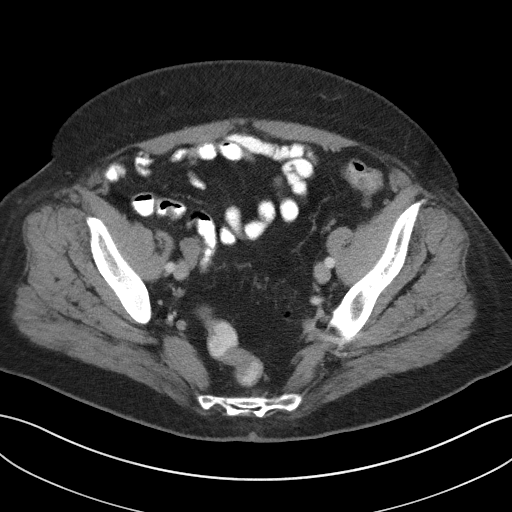
[im 36/108  soft-tissue]
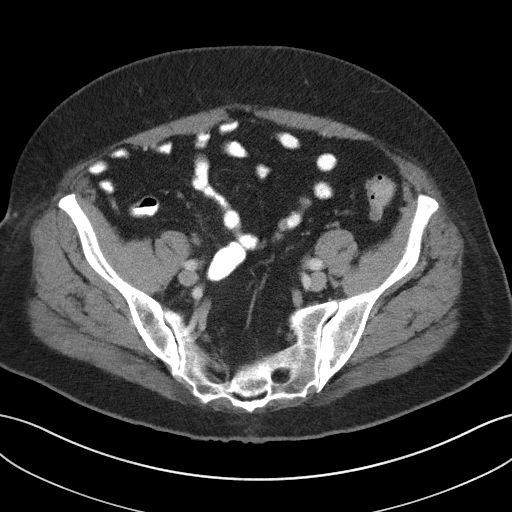
[im 45/108  soft-tissue]
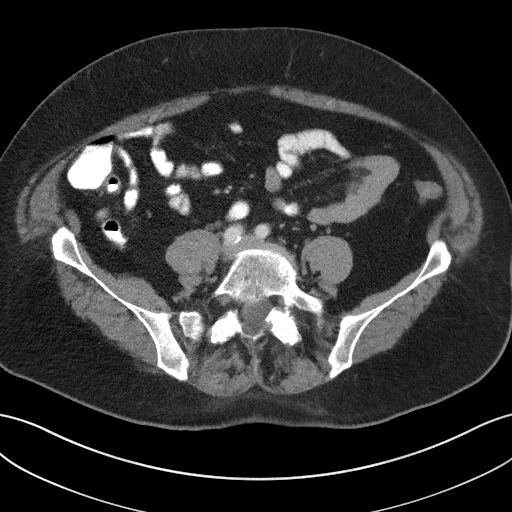
[im 54/108  soft-tissue]
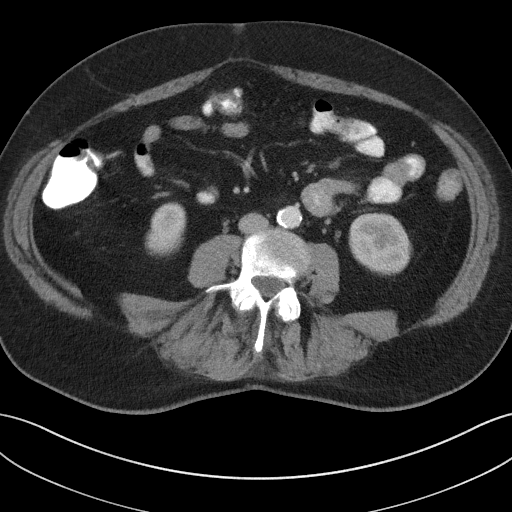
[im 63/108  soft-tissue]
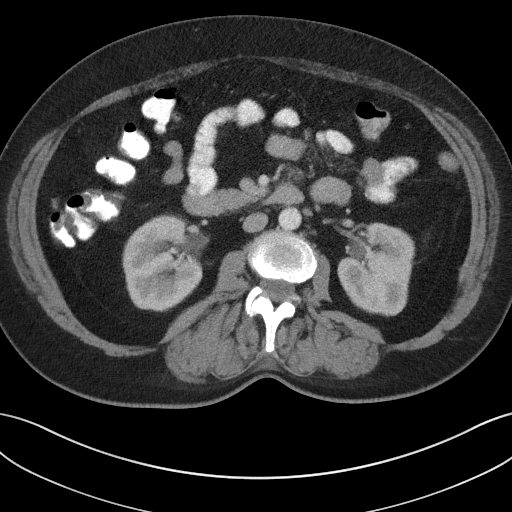
[im 72/108  soft-tissue]
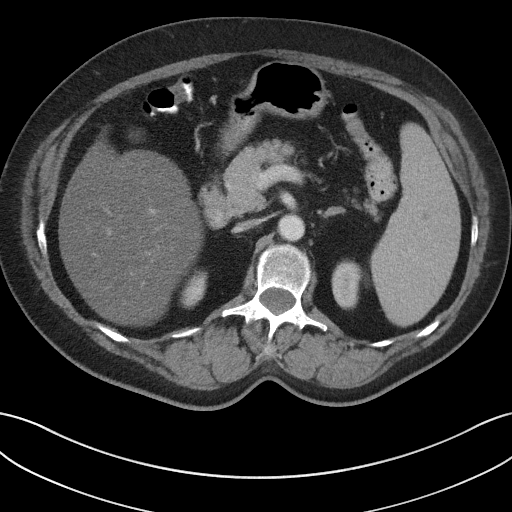
[im 72/108  bone]
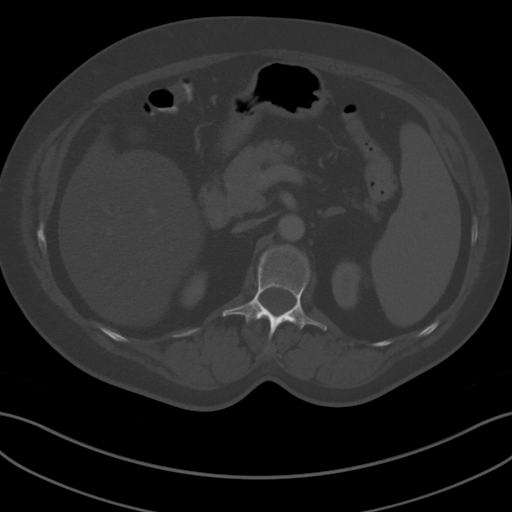
[im 76/108  soft-tissue]
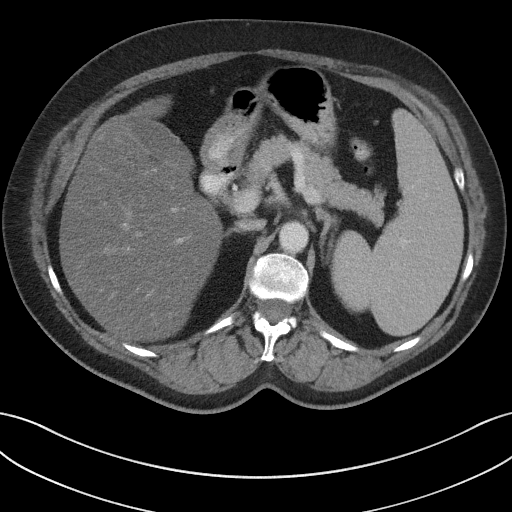
[im 85/108  soft-tissue]
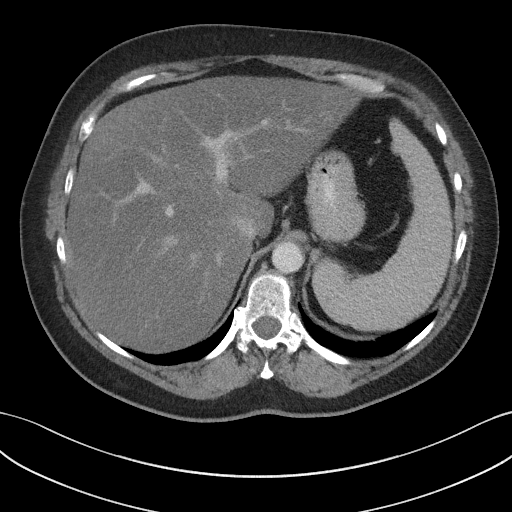
[im 94/108  soft-tissue]
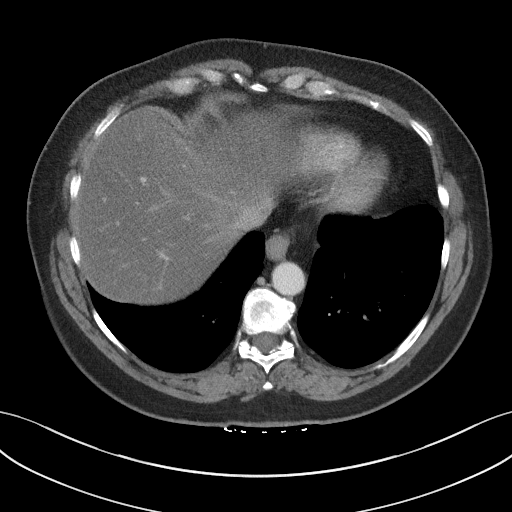
[im 103/108  soft-tissue]
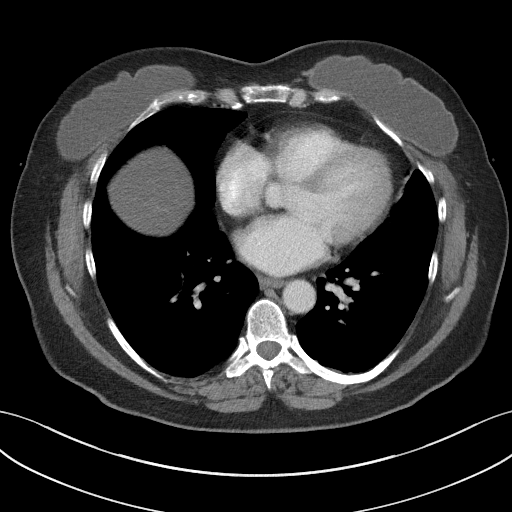

[Series 5: coronal st · coronal · 0.80mm/px · 3 of 101 slices shown]
[im 34/101  soft-tissue]
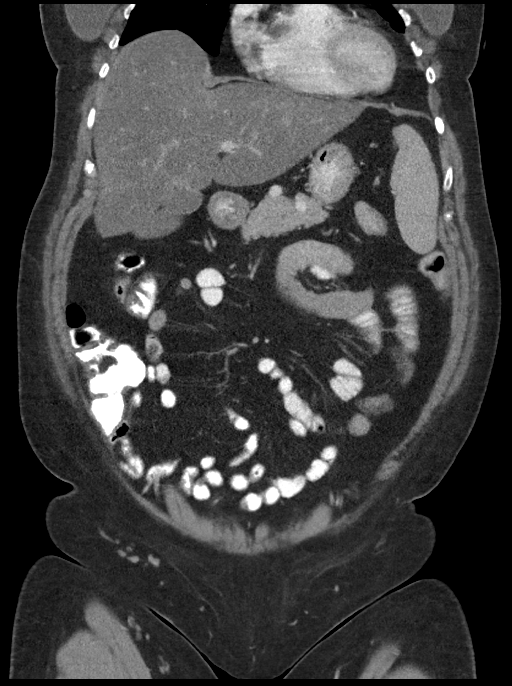
[im 45/101  soft-tissue]
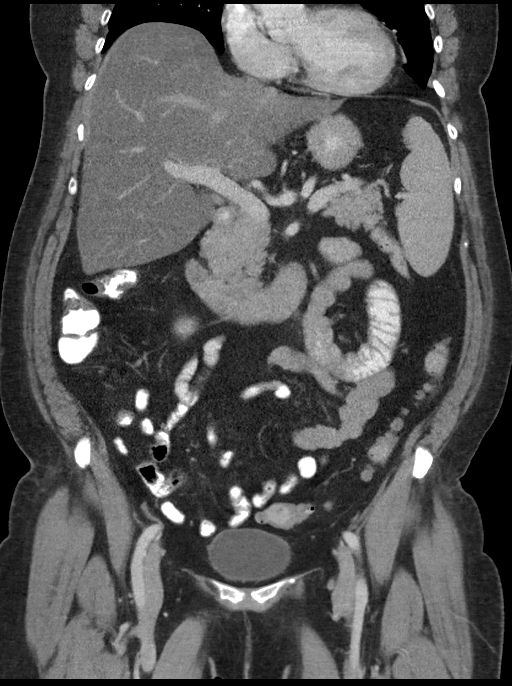
[im 56/101  soft-tissue]
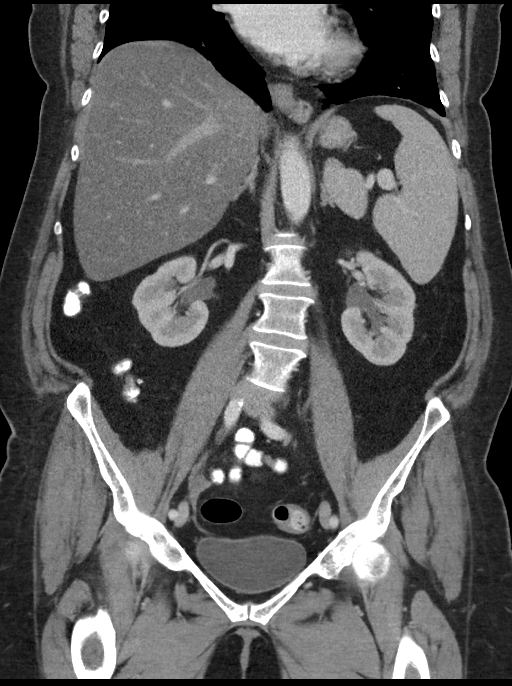

[16 of 46 positions shown; findings below may reference images not displayed]

FINDINGS: Lower chest: Normal heart size. No pericardial effusion. Dependent
atelectasis within the lower lobes bilaterally. No pleural effusion.

Hepatobiliary: The liver is diffusely low in attenuation compatible
with steatosis. Fatty sparing adjacent to the gallbladder fossa.
Gallbladder is unremarkable. No intrahepatic or extrahepatic biliary
ductal dilatation.

Pancreas: Unremarkable

Spleen: Splenomegaly measuring 17.5 cm (image 27; series 2).

Adrenals/Urinary Tract: Normal adrenal glands. Kidneys enhance
symmetrically with contrast. No hydronephrosis. Urinary bladder is
unremarkable.

Stomach/Bowel: Descending and sigmoid colonic diverticulosis.
Significant interval improvement and near complete resolution of
previously visualized inflammatory changes about the sigmoid colon.
Mild residual inflammatory stranding about the sigmoid colon (image
73; series 5). No evidence for bowel obstruction. Small hiatal
hernia. Normal morphology of the stomach. No free fluid or free
intraperitoneal air.

Vascular/Lymphatic: Normal caliber abdominal aorta. No
retroperitoneal lymphadenopathy. Peripheral calcified
atherosclerotic plaque.

Reproductive: Uterus is surgically absent.

Other: None.

Musculoskeletal: Lumbar spine degenerative changes. No aggressive or
acute appearing osseous lesions.
IMPRESSION: 1. Interval improvement but not complete resolution of inflammatory
changes about the sigmoid colon most compatible with improving
diverticulitis.
2. Hepatic steatosis.
3. Splenomegaly.

## 2018-05-28 ENCOUNTER — Ambulatory Visit: Payer: Self-pay

## 2018-05-28 ENCOUNTER — Ambulatory Visit: Payer: 59

## 2018-06-15 ENCOUNTER — Ambulatory Visit
Admission: RE | Admit: 2018-06-15 | Discharge: 2018-06-15 | Disposition: A | Discharge status: Home / Self Care | Payer: 59 | Source: Ambulatory Visit | Attending: Neurology | Admitting: Neurology

## 2018-06-15 DIAGNOSIS — R55 Syncope and collapse: Secondary | ICD-10-CM | POA: Diagnosis present

## 2019-06-16 ENCOUNTER — Ambulatory Visit: Payer: 59 | Admitting: Student in an Organized Health Care Education/Training Program

## 2019-09-29 ENCOUNTER — Telehealth: Payer: Self-pay | Admitting: *Deleted

## 2019-09-29 DIAGNOSIS — G894 Chronic pain syndrome: Secondary | ICD-10-CM | POA: Insufficient documentation

## 2019-09-29 DIAGNOSIS — M899 Disorder of bone, unspecified: Secondary | ICD-10-CM | POA: Insufficient documentation

## 2019-09-29 DIAGNOSIS — Z789 Other specified health status: Secondary | ICD-10-CM | POA: Insufficient documentation

## 2019-09-29 DIAGNOSIS — Z79899 Other long term (current) drug therapy: Secondary | ICD-10-CM | POA: Insufficient documentation

## 2019-09-29 DIAGNOSIS — D649 Anemia, unspecified: Secondary | ICD-10-CM | POA: Insufficient documentation

## 2019-09-29 NOTE — Telephone Encounter (Signed)
Called patient back, 2nd call.  I did see her message that asked to let the phone ring for a while however, the phone goes to voicemail on 4th ring. Mailbox is full and I am unable to leave a message.

## 2019-09-29 NOTE — Progress Notes (Addendum)
Patient: Grace Hester  Service Category: E/M  Provider: Gaspar Cola, MD  DOB: 1960-09-22  DOS: 10/03/2019  Location: Office  MRN: [id number]  Setting: Ambulatory outpatient  Referring Provider: Lynnell Jude, MD  Type: New Patient  Specialty: Interventional Pain Management  PCP: Lynnell Jude, MD  Location: Remote location  Delivery: TeleHealth     Virtual Encounter - Pain Management PROVIDER NOTE: Information contained herein reflects review and annotations entered in association with encounter. Interpretation of such information and data should be left to medically-trained personnel. Information provided to patient can be located elsewhere in the medical record under "Patient Instructions". Document created using STT-dictation technology, any transcriptional errors that may result from process are unintentional.    Contact & Pharmacy Preferred: [place]: [phone] (home) Mobile: [phone] (mobile) E-mail: [email or web address]  [id number] [id number]: [phone]: [phone]  Pre-screening note:  Our staff contacted Ms. FRobin Searingand offered her an "in person", "face-to-face" appointment versus a telephone encounter. She indicated preferring the telephone encounter, at this time.  Primary Reason(s) for Visit: Tele-Encounter for initial evaluation of one or more chronic problems (new to examiner) potentially causing chronic pain, and posing a threat to normal musculoskeletal function. (Level of risk: High) CC: Back Pain (lower) and Foot Burn  I contacted TArtis Flockon 10/03/2019 via telephone.      I clearly identified myself as FGaspar Cola MD. I verified that I was speaking with the correct person using two identifiers (Name: TTARNISHA KACHMAR and date of birth: 8May 16, 1961.  Advanced Informed Consent I sought verbal advanced consent from TArtis Flockfor  virtual visit interactions. I informed [name] possible security and privacy concerns, risks, and limitations associated with providing "not-in-person" medical evaluation and management services. I also informed [name] the availability of "in-person" appointments. Finally, I informed her that there would be a charge for the virtual visit and that she could be  personally, fully or partially, financially responsible for it. Ms. FRobin Searingexpressed understanding and agreed to proceed.   HPI  Ms. FRobin Searingis a 59y.o. year old, female patient, contacted today for an initial evaluation of her chronic pain. She has Acute cystitis; Absolute anemia; Anxiety; Benign essential HTN; Calculus of kidney; Essential (primary) hypertension; Diarrhea, functional; Acid reflux; Billowing mitral valve; Beat, premature ventricular; Benign neoplasm of sigmoid colon; Other diseases of stomach and duodenum; Noninfectious gastroenteritis, unspecified; Diarrhea; Gastritis; Acute diverticulitis; Chronic cystitis; Dysuria; Incomplete emptying of bladder; Loose stools; Mixed urge and stress incontinence; Palpitations; History of urinary stone; Pneumonia; Proteus mirabilis infection; Septic shock ([place]; Sigmoid diverticulitis; Tachycardia; Thrombocytopenia ([place]; Weakness; Diverticulitis of colon without hemorrhage; Benign neoplasm of ascending colon; S/P laparoscopic colectomy; Anemia; Chronic pain syndrome; Pharmacologic therapy; Disorder of skeletal system; Problems influencing health status; Polyneuropathy, peripheral sensorimotor axonal; Chronic feet pain (Bilateral); Chronic ankle pain (Bilateral); Chronic hip pain (Bilateral) (L>R); Chronic knee pain (Bilateral) (R>L); Chronic lower extremity pain (Bilateral); Chronic hand pain (Bilateral); History of lumbar surgery; Abnormal nerve conduction studies; and Complaints of weakness of lower extremity on their problem list.   Onset and Duration: Gradual 9 years Cause  of pain: neuropathy Severity: Getting worse, NAS-11 at its worse: 9/10, NAS-11 at its best: 6/10, NAS-11 now: 7/10 and NAS-11 on the average: 7/10 Timing: Evening, Night, During activity or exercise, After activity or exercise and After a period of immobility Aggravating Factors: Climbing, Prolonged  sitting and Walking Alleviating Factors: compression socks Associated Problems: Dizziness, Nausea, Numbness, Tingling, Pain that wakes patient up and Pain that does not allow patient to sleep Quality of Pain: Aching, Burning, Constant, Disabling, Nagging, Numb, Sharp and Shooting Previous Examinations or Tests: MRI scan and Nerve conduction test Previous Treatments: Narcotic medications and Physical Therapy  According to the patient she has been experiencing problems with both lower extremities for the past 12 years.  She indicates that the worst pain is that of her feet and ankles, bilaterally, with the left being worse than the right.  Her primary complaint is that of numbness and pain that feels sharp, electrical-like, burning sensation.  The second worst pain is that of the hips, bilaterally, with the left being worse than the right.  She denies any prior surgeries, nerve blocks, or joint injections.  The patient's third area pain is that of the knees, bilaterally, with the right being worse than the left.  Again she denies any surgeries, nerve blocks, or joint injections.  In general, she indicates that the pain is primarily on both lower extremities, where she has pain that is sharp, electrical-like, and burning over the area of her lower extremities especially in the left calf.  Other than this the only other area where she complains of having some pain is that of the hands where she has some arthritis and some eczema.  She currently denies having any low back pain, but review of the records indicate that she had some back surgery.  She seems to think that all of this started after she had an  episode of septicemia secondary to some kidney stones where she spent several days in the hospital in coma.  She has had problems with the lower extremities for a number of years but she has indicated that she wanted to try to avoid coming to the pain clinic as she was trying to get this pain under control by herself.  She did call to a neurologist, Dr. 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  She denies any diabetes and does not seem to know exactly where this peripheral neuropathy is coming from.  She indicates coming to [place] to see if there is anything that we can offer her.  Today I make sure not to give her any false hope as these peripheral neuropathies tend to be idiopathic and irreversible.  However, I did tell her that we would be looking into it and see if we can identify a reversible cause.  I also informed the patient that our specialty is adequately interventional pain management and this type of sensorimotor peripheral neuropathy is where the numbness predominates, tends to fall outside of our realm of expertise and usually there is not much that we can offer the patient for this.  She understood and accepted.  We have instructed the patient to give [place] a call once she completes the ordered tests so that we can go over them and see if there is anything that we can offer her.  Historic Controlled Substance Pharmacotherapy Review  Current opioid analgesics:  None  Highest recorded MME/day: 0 mg/day MME/day: 0 mg/day   Historical Monitoring: The patient  reports no history of drug use. List of all UDS Test(s): No results found. List of other Serum/Urine Drug Screening Test(s):  No results found. Historical Background Evaluation: [place] PMP: PDMP  reviewed during this encounter. Two (2) year initial data search conducted.             PMP  NARX Score Report:  Narcotic: 070 Sedative: 090 Stimulant: 000 Risk Assessment Profile: PMP NARX Overdose Risk Score: 120  Pharmacologic Plan: As per protocol, I have not taken over any controlled substance management, pending the results of ordered tests and/or consults.            Initial impression: Pending review of available data and ordered tests.  Meds   Current Outpatient Medications:  .  albuterol (PROVENTIL HFA;VENTOLIN HFA) 108 (90 Base) MCG/ACT inhaler, Inhale 1-2 puffs into the lungs every 6 (six) hours as needed for wheezing or shortness of breath., Disp: 1 Inhaler, Rfl: 0 .  busPIRone (BUSPAR) 10 MG tablet, Take 10 mg by mouth 2 (two) times daily. 2 tabs twice per day, Disp: , Rfl:  .  esomeprazole (NEXIUM) 20 MG capsule, Take 20 mg by mouth every morning. , Disp: , Rfl:  .  EUCRISA 2 % OINT, Apply 1 application topically at bedtime. , Disp: , Rfl: 1 .  fexofenadine (ALLEGRA) 180 MG tablet, Take 180 mg by mouth daily. , Disp: , Rfl:  .  gabapentin (NEURONTIN) 300 MG capsule, Take 600-900 mg by mouth 2 (two) times daily. Take 2 cap qam, one in afternoon,  and 3 caps qhs, Disp: , Rfl:  .  hydrochlorothiazide (HYDRODIURIL) 12.5 MG tablet, Take 12.5 mg by mouth daily. , Disp: , Rfl:  .  metoprolol succinate (TOPROL-XL) 50 MG 24 hr tablet, Take 50 mg by mouth daily. , Disp: , Rfl:  .  oxybutynin (DITROPAN) 5 MG tablet, , Disp: , Rfl:   ROS  Cardiovascular: Abnormal heart rhythm and High blood pressure Pulmonary or Respiratory: No reported pulmonary signs or symptoms such as wheezing and difficulty taking a deep full breath (Asthma), difficulty blowing air out (Emphysema), coughing up mucus (Bronchitis), persistent dry cough, or temporary stoppage of breathing during sleep Neurological: Abnormal skin sensations (Peripheral Neuropathy) Psychological-Psychiatric: Depressed Gastrointestinal: Reflux or heatburn Genitourinary: No reported renal or genitourinary signs or symptoms such  as difficulty voiding or producing urine, peeing blood, non-functioning kidney, kidney stones, difficulty emptying the bladder, difficulty controlling the flow of urine, or chronic kidney disease Hematological: No reported hematological signs or symptoms such as prolonged bleeding, low or poor functioning platelets, bruising or bleeding easily, hereditary bleeding problems, low energy levels due to low hemoglobin or being anemic Endocrine: No reported endocrine signs or symptoms such as high or low blood sugar, rapid heart rate due to high thyroid levels, obesity or weight gain due to slow thyroid or thyroid disease Rheumatologic: No reported rheumatological signs and symptoms such as fatigue, joint pain, tenderness, swelling, redness, heat, stiffness, decreased range of motion, with or without associated rash Musculoskeletal: Negative for myasthenia gravis, muscular dystrophy, multiple sclerosis or malignant hyperthermia Work History: Working full time  Allergies  Ms. Robin Searing is allergic to latex and tape.  Laboratory Chemistry Profile   Screening Lab Results  Component Value Date   MRSAPCR NEGATIVE 09/21/2017   HIV NON REACTIVE 10/28/2016    Inflammation (CRP: Acute Phase) (ESR: Chronic Phase) Lab Results  Component Value Date   LATICACIDVEN 2.5 ([place]) 06/10/2017    Rheumatology Lab Results  Component Value Date   ANA Negative 02/14/2016    Renal Lab Results  Component Value Date   BUN 8 10/13/2017   CREATININE 0.50 10/13/2017   GFRAA >60 10/13/2017   GFRNONAA >  60 10/13/2017    Hepatic Lab Results  Component Value Date   AST 39 10/13/2017   ALT 51 10/13/2017   ALBUMIN 4.3 10/13/2017   ALKPHOS 85 10/13/2017   LIPASE 42 06/10/2017    Electrolytes Lab Results  Component Value Date   NA 140 10/13/2017   K 4.2 10/13/2017   CL 103 10/13/2017   CALCIUM 9.3 10/13/2017   MG 2.1 09/23/2017   PHOS 2.4 (L) 09/23/2017    Neuropathy Lab Results  Component Value Date   HIV  NON REACTIVE 10/28/2016    CNS No results found.  Bone No results found.  Coagulation Lab Results  Component Value Date   PLT 175 10/13/2017   DDIMER (H) 07/30/2007    1.08        AT THE INHOUSE ESTABLISHED CUTOFF VALUE OF 0.48 ug/mL FEU, THIS ASSAY HAS BEEN DOCUMENTED IN THE LITERATURE TO HAVE    Cardiovascular Lab Results  Component Value Date   CKTOTAL 50 08/02/2012   CKMB < 0.5 (L) 08/02/2012   TROPONINI < 0.02 07/17/2014   HGB 13.1 10/13/2017   HCT 37.0 10/13/2017    ID Lab Results  Component Value Date   HIV NON REACTIVE 10/28/2016   MRSAPCR NEGATIVE 09/21/2017   MICROTEXT  07/17/2014       C.DIFFICILE ANTIGEN       C.DIFFICILE GDH ANTIGEN : NEGATIVE   C.DIFFICILE TOXIN A/B     C.DIFFICILE TOXINS A AND B : NEGATIVE   INTERPRETATION            Negative for C. difficile.    ANTIBIOTIC                                                        Cancer No results found for: CEA, CA125, LABCA2  Endocrine Lab Results  Component Value Date   TSH 1.39 07/17/2014    Note: Lab results reviewed.  Imaging Review  Shoulder Imaging: Shoulder-R DG:  Results for orders placed during the hospital encounter of 03/26/15  DG Shoulder Right   Narrative CLINICAL DATA:  Bilateral shoulder pain numbness and weakness with week grip and limited range of motion  EXAM: LEFT SHOULDER - 2+ VIEW; RIGHT SHOULDER - 2+ VIEW  COMPARISON:  Chest x-ray dated October 16, 2013 which revealed portions of the shoulders  FINDINGS: Right shoulder: The bones are adequately mineralized. The joint spaces are preserved. The subacromial subdeltoid space is normal. The observed portions of the right clavicle and upper right ribs are normal. No scapular abnormality is observed.  Left shoulder: The bones are adequately mineralized. There is no fracture nor dislocation. The joint spaces are reasonably well maintained subacromial subdeltoid space is normal. The observed portions of the left  clavicle and upper left ribs are normal. The scapula is unremarkable.  IMPRESSION: There is no acute or significant chronic bony abnormality of the right shoulder.   Electronically Signed   By: David  Martinique M.D.   On: 03/26/2015 17:11    Shoulder-L DG:  Results for orders placed during the hospital encounter of 03/26/15  DG Shoulder Left   Narrative CLINICAL DATA:  Bilateral shoulder pain numbness and weakness with week grip and limited range of motion  EXAM: LEFT SHOULDER - 2+ VIEW; RIGHT SHOULDER - 2+ VIEW  COMPARISON:  Chest x-ray  dated October 16, 2013 which revealed portions of the shoulders  FINDINGS: Right shoulder: The bones are adequately mineralized. The joint spaces are preserved. The subacromial subdeltoid space is normal. The observed portions of the right clavicle and upper right ribs are normal. No scapular abnormality is observed.  Left shoulder: The bones are adequately mineralized. There is no fracture nor dislocation. The joint spaces are reasonably well maintained subacromial subdeltoid space is normal. The observed portions of the left clavicle and upper left ribs are normal. The scapula is unremarkable.  IMPRESSION: There is no acute or significant chronic bony abnormality of the right shoulder.   Electronically Signed   By: David  Martinique M.D.   On: 03/26/2015 17:11    Lumbosacral Imaging: Lumbar MR wo contrast:  Results for orders placed during the hospital encounter of 06/02/05  MR Lumbar Spine Wo Contrast   Narrative Clinical Data: Low back pain radiating into left leg.  Comparison: None.  MRI LUMBAR SPINE WITHOUT CONTRAST:  Technique: Multiplanar and multiecho pulse sequences of the lumbar spine, to include the lower thoracic and upper sacral regions, were obtained according to standard protocol, without IV contrast.  Findings: Scout images demonstrates S-shaped scoliosis of the thoracolumbar spine, convex to the right in thoracic  spine and to the left in the lumbar spine. The apex in lumbar spine at approximately L1-2. Vertebral body height and alignment are maintained in the sagittal plane. Vertebral body signal is normal. Conus medullaris is normal in signal and position. T12-L1, L1-2 and L2-3 levels demonstrate normal intervertebral disks with no central canal or foraminal stenosis. There is some mild appearing facet arthropathy at the L3-4 level.  At L4-5, there is a small annular tear with a minimal central protrusion. This mildly indents the ventral thecal sac. Central canal and foramina remain widely patent. There is some mild facet arthropathy at this level.   At L5-S1, there is some facet arthropathy but the central canal and foramina are widely patent with normal appearing disk.   IMPRESSION:  1. Thoracolumbar scoliosis. This can be better evaluated with plain radiographs as indicated.  2. Small annular tear and disk bulge at L4-5 without central canal or foraminal stenosis.  3. Mild appearing facet arthropathy at the lower lumbar spine.    Provider: Henrietta Dine, Lonie Peak Foster   Lumbar MR w/wo contrast:  Results for orders placed during the hospital encounter of 12/31/05  MR Lumbar Spine W Wo Contrast   Narrative Clinical Data: Back pain, lower extremity pain, weakness, and numbness.   MRI LUMBAR SPINE WITHOUT AND WITH CONTRAST:  Technique: Multiplanar and multiecho pulse sequences of the lumbar spine, to include the lower thoracic region and upper sacral regions, were obtained according to standard protocol before and after administration of intravenous contrast.  Contrast:   Comparison: 06/02/05.  Findings:  The conus medullaris is at the L1 level. No malalignment is present although there is mild levoconvex lumbar scoliosis. Postoperative findings noted at the L4-5 level where there is enhancement in the posterior soft tissues.   Additional findings at individual levels are as follows:  L2-3:  Unremarkable.  L3-4: Unremarkable.  L4-5: Shallow central disk protrusion noted. There is a suggestion of annular tear extending along the disk material inferiorly in the right neural foramen. The AP diameter of the thecal sac at this level is 15 mm and was previously 13 mm. There is no central, foraminal, or subarticular lateral recess stenosis identified at this level.   The patient has had  prior partial left L4 and L5 laminectomies as well as medial partial left facetectomy.  L5-S1: Mild left facet overgrowth.  IMPRESSION:  1. Postoperative findings on the left at the L4-5 level. We currently do not demonstrate nerve root impingement. There is a shallow central disk protrusion at L4-5 but the central canal and foramina remain widely patent.   2. Levoconvex scoliosis.  Provider: Lavonia Dana   Complexity Note: Imaging results reviewed. Results shared with Ms. Rochelle, using [employer].                         [place]  Drug: Ms. Robin Searing  reports no history of drug use. Alcohol:  reports no history of alcohol use. Tobacco:  reports that she quit smoking about 19 years ago. She has never used smokeless tobacco. Medical:  has a past medical history of Abdominal pain, Acid reflux (01/04/2014), Acute respiratory failure with hypoxemia ([place]) (09/20/2014), Anemia, Anxiety, Asthma, Benign neoplasm of sigmoid colon, Billowing mitral valve, Calculus of kidney (7/34/1937), Complication of anesthesia, Depression, Essential (primary) hypertension (01/10/2015), GERD (gastroesophageal reflux disease), Hypertension, Incomplete emptying of bladder (07/05/2014), Kidney stones, PVC (premature ventricular contraction), Sciatica of left side, Septic shock ([place]) (2015), Sigmoid diverticulitis (01/27/2017), Sleep apnea, Tachycardia (04/17/2014), and Thrombocytopenia ([place]) (09/20/2014). Family: family history includes Heart disease in her father and mother; Stroke in her mother.  Past Surgical History:  Procedure  Laterality Date  . ABDOMINAL HYSTERECTOMY    . AUGMENTATION MAMMAPLASTY  2008  . COLONOSCOPY WITH PROPOFOL N/A 03/14/2016   Procedure: COLONOSCOPY WITH PROPOFOL;  Surgeon: Lucilla Lame, MD;  Location: [facility];  Service: Endoscopy;  Laterality: N/A;  . COLONOSCOPY WITH PROPOFOL N/A 07/27/2017   Procedure: COLONOSCOPY WITH Biopsy;  Surgeon: Lucilla Lame, MD;  Location: [facility];  Service: Endoscopy;  Laterality: N/A;  . ESOPHAGOGASTRODUODENOSCOPY (EGD) WITH PROPOFOL N/A 03/14/2016   Procedure: ESOPHAGOGASTRODUODENOSCOPY (EGD) WITH PROPOFOL;  Surgeon: Lucilla Lame, MD;  Location: [facility];  Service: Endoscopy;  Laterality: N/A;  . FLEXOR TENDON REPAIR Left 08/04/13   Dr. Elvina Mattes, [facility]  . GANGLION CYST EXCISION Left   . IMAGE GUIDED SINUS SURGERY    . KIDNEY STONE SURGERY  2015  . LAPAROSCOPIC SIGMOID COLECTOMY N/A 09/21/2017   Procedure: LAPAROSCOPIC SIGMOID COLECTOMY;  Surgeon: Jules Husbands, MD;  Location: [facility];  Service: General;  Laterality: N/A;  . POLYPECTOMY  03/14/2016   Procedure: POLYPECTOMY;  Surgeon: Lucilla Lame, MD;  Location: [facility];  Service: Endoscopy;;  . POLYPECTOMY  07/27/2017   Procedure: POLYPECTOMY INTESTINAL;  Surgeon: Lucilla Lame, MD;  Location: [place];  Service: Endoscopy;;  Ascending colon polyp  . ROTATOR CUFF REPAIR Left    Active Ambulatory Problems    Diagnosis Date Noted  . Acute cystitis 07/07/2014  . Absolute anemia 02/13/2016  . Anxiety 02/13/2016  . Benign essential HTN 03/26/2015  . Calculus of kidney 02/07/2013  . Essential (primary) hypertension 01/10/2015  . Diarrhea, functional 01/15/2016  . Acid reflux 01/04/2014  . Billowing mitral valve 02/13/2016  . Beat, premature ventricular 02/13/2016  . Benign neoplasm of sigmoid colon   . Other diseases of stomach and duodenum   . Noninfectious gastroenteritis, unspecified   . Diarrhea   . Gastritis   . Acute diverticulitis 10/28/2016  .  Chronic cystitis 07/12/2015  . Dysuria 07/05/2014  . Incomplete emptying of bladder 07/05/2014  . Loose stools 01/27/2017  . Mixed urge and stress incontinence 08/06/2012  . Palpitations  06/16/2017  . History of urinary stone 09/15/2012  . Pneumonia 04/28/2015  . Proteus mirabilis infection 09/27/2014  . Septic shock ([place]) 09/18/2014  . Sigmoid diverticulitis 01/27/2017  . Tachycardia 04/17/2014  . Thrombocytopenia ([place]) 09/20/2014  . Weakness 04/17/2014  . Diverticulitis of colon without hemorrhage   . Benign neoplasm of ascending colon   . S/P laparoscopic colectomy 09/21/2017  . Anemia 09/29/2019  . Chronic pain syndrome 09/29/2019  . Pharmacologic therapy 09/29/2019  . Disorder of skeletal system 09/29/2019  . Problems influencing health status 09/29/2019  . Polyneuropathy, peripheral sensorimotor axonal 10/03/2019  . Chronic feet pain (Bilateral) 10/03/2019  . Chronic ankle pain (Bilateral) 10/03/2019  . Chronic hip pain (Bilateral) (L>R) 10/03/2019  . Chronic knee pain (Bilateral) (R>L) 10/03/2019  . Chronic lower extremity pain (Bilateral) 10/03/2019  . Chronic hand pain (Bilateral) 10/03/2019  . History of lumbar surgery 10/03/2019  . Abnormal nerve conduction studies 10/03/2019  . Complaints of weakness of lower extremity 10/03/2019   Resolved Ambulatory Problems    Diagnosis Date Noted  . Acute respiratory failure with hypoxemia ([place]) 09/20/2014  . Right upper quadrant abdominal pain 01/15/2016  . Chest pain 01/04/2014   Past Medical History:  Diagnosis Date  . Abdominal pain   . Asthma   . Complication of anesthesia   . Depression   . GERD (gastroesophageal reflux disease)   . Hypertension   . Kidney stones   . PVC (premature ventricular contraction)   . Sciatica of left side   . Sleep apnea    Assessment  Primary Diagnosis & Pertinent Problem List: The primary encounter diagnosis was Chronic lower extremity pain (Bilateral). Diagnoses of Complaints of  weakness of lower extremity, Polyneuropathy, peripheral sensorimotor axonal, Chronic feet pain (Bilateral), Chronic ankle pain (Bilateral), Chronic hip pain (Bilateral) (L>R), Chronic knee pain (Bilateral) (R>L), Chronic hand pain (Bilateral), History of lumbar surgery, Abnormal nerve conduction studies, Chronic pain syndrome, Pharmacologic therapy, Disorder of skeletal system, and Problems influencing health status were also pertinent to this visit.  Visit Diagnosis (New problems to examiner): 1. Chronic lower extremity pain (Bilateral)   2. Complaints of weakness of lower extremity   3. Polyneuropathy, peripheral sensorimotor axonal   4. Chronic feet pain (Bilateral)   5. Chronic ankle pain (Bilateral)   6. Chronic hip pain (Bilateral) (L>R)   7. Chronic knee pain (Bilateral) (R>L)   8. Chronic hand pain (Bilateral)   9. History of lumbar surgery   10. Abnormal nerve conduction studies   11. Chronic pain syndrome   12. Pharmacologic therapy   13. Disorder of skeletal system   14. Problems influencing health status    Plan of Care (Initial workup plan)  Note: Ms. Robin Searing was reminded that as per protocol, today's visit has been an evaluation only. We have not taken over the patient's controlled substance management.  Problem-specific plan: No problem-specific Assessment & Plan notes found for this encounter.   Lab Orders     Compliance Drug Analysis, Ur     Comp. Metabolic Panel (12)     Magnesium     Vitamin B12     Sedimentation rate     25-Hydroxy vitamin D Lcms D2+D3     C-reactive protein     Neuropathy Panel     Vitamin B1     Copper, serum     Vitamin E  Imaging Orders     DG HIP UNILAT W OR W/O PELVIS 2-3 VIEWS RIGHT     DG HIP UNILAT  W OR W/O PELVIS 2-3 VIEWS LEFT     DG Knee 1-2 Views Right     DG Knee 1-2 Views Left     DG Ankle Complete Left     DG Ankle Complete Right     DG Foot Complete Left     DG Foot Complete Right  Referral Orders     Ambulatory  referral to Physical Therapy Procedure Orders    No procedure(s) ordered today   Pharmacotherapy (current): Medications ordered:  No orders of the defined types were placed in this encounter.    Pharmacological management options:  Opioid Analgesics: The patient was informed that there is no guarantee that she would be a candidate for opioid analgesics. The decision will be made following CDC guidelines. This decision will be based on the results of diagnostic studies, as well as Ms. Bracken's risk profile.   Membrane stabilizer: To be determined at a later time  Muscle relaxant: To be determined at a later time  NSAID: To be determined at a later time  Other analgesic(s): To be determined at a later time   Interventional management options: Ms. Robin Searing was informed that there is no guarantee that she would be a candidate for interventional therapies. The decision will be based on the results of diagnostic studies, as well as Ms. Shuey's risk profile.  Procedure(s) under consideration:  Diagnostic bilateral LSB #1  Possible spinal cord stimulator trial    Provider-requested follow-up: Return for (VV), (s/p Tests).  Future Appointments  Date Time Provider [place]  10/31/2019  9:15 AM Milinda Pointer, MD [facility] None   Total duration of encounter: 35 minutes.  Primary Care Physician: Lynnell Jude, MD Note by: Gaspar Cola, MD Date: 10/03/2019; Time: 4:06 PM

## 2019-10-03 ENCOUNTER — Other Ambulatory Visit: Payer: Self-pay

## 2019-10-03 ENCOUNTER — Ambulatory Visit: Payer: BC Managed Care – PPO | Attending: Pain Medicine | Admitting: Pain Medicine

## 2019-10-03 DIAGNOSIS — R29898 Other symptoms and signs involving the musculoskeletal system: Secondary | ICD-10-CM | POA: Diagnosis not present

## 2019-10-03 DIAGNOSIS — Z9889 Other specified postprocedural states: Secondary | ICD-10-CM | POA: Insufficient documentation

## 2019-10-03 DIAGNOSIS — G608 Other hereditary and idiopathic neuropathies: Secondary | ICD-10-CM | POA: Diagnosis not present

## 2019-10-03 DIAGNOSIS — Z789 Other specified health status: Secondary | ICD-10-CM

## 2019-10-03 DIAGNOSIS — M79605 Pain in left leg: Secondary | ICD-10-CM

## 2019-10-03 DIAGNOSIS — M79671 Pain in right foot: Secondary | ICD-10-CM | POA: Diagnosis not present

## 2019-10-03 DIAGNOSIS — G894 Chronic pain syndrome: Secondary | ICD-10-CM

## 2019-10-03 DIAGNOSIS — M25551 Pain in right hip: Secondary | ICD-10-CM

## 2019-10-03 DIAGNOSIS — G8929 Other chronic pain: Secondary | ICD-10-CM | POA: Insufficient documentation

## 2019-10-03 DIAGNOSIS — M79604 Pain in right leg: Secondary | ICD-10-CM | POA: Diagnosis not present

## 2019-10-03 DIAGNOSIS — M79642 Pain in left hand: Secondary | ICD-10-CM

## 2019-10-03 DIAGNOSIS — M25561 Pain in right knee: Secondary | ICD-10-CM

## 2019-10-03 DIAGNOSIS — Z79899 Other long term (current) drug therapy: Secondary | ICD-10-CM

## 2019-10-03 DIAGNOSIS — M25562 Pain in left knee: Secondary | ICD-10-CM

## 2019-10-03 DIAGNOSIS — M79641 Pain in right hand: Secondary | ICD-10-CM | POA: Insufficient documentation

## 2019-10-03 DIAGNOSIS — R94130 Abnormal response to nerve stimulation, unspecified: Secondary | ICD-10-CM | POA: Insufficient documentation

## 2019-10-03 DIAGNOSIS — M899 Disorder of bone, unspecified: Secondary | ICD-10-CM

## 2019-10-03 DIAGNOSIS — M79672 Pain in left foot: Secondary | ICD-10-CM

## 2019-10-03 DIAGNOSIS — M25572 Pain in left ankle and joints of left foot: Secondary | ICD-10-CM

## 2019-10-03 DIAGNOSIS — M25571 Pain in right ankle and joints of right foot: Secondary | ICD-10-CM

## 2019-10-03 DIAGNOSIS — M25552 Pain in left hip: Secondary | ICD-10-CM

## 2019-10-03 NOTE — Addendum Note (Signed)
Addended by: Milinda Pointer A on: 10/03/2019 04:07 PM   Modules accepted: Orders

## 2019-10-10 ENCOUNTER — Ambulatory Visit
Admission: RE | Admit: 2019-10-10 | Discharge: 2019-10-10 | Disposition: A | Discharge status: Home / Self Care | Payer: BC Managed Care – PPO | Source: Ambulatory Visit | Attending: Pain Medicine | Admitting: Pain Medicine

## 2019-10-10 DIAGNOSIS — M25551 Pain in right hip: Secondary | ICD-10-CM | POA: Diagnosis present

## 2019-10-10 DIAGNOSIS — M25572 Pain in left ankle and joints of left foot: Secondary | ICD-10-CM

## 2019-10-10 DIAGNOSIS — M25571 Pain in right ankle and joints of right foot: Secondary | ICD-10-CM | POA: Insufficient documentation

## 2019-10-10 DIAGNOSIS — M25561 Pain in right knee: Secondary | ICD-10-CM | POA: Diagnosis present

## 2019-10-10 DIAGNOSIS — G8929 Other chronic pain: Secondary | ICD-10-CM | POA: Insufficient documentation

## 2019-10-10 DIAGNOSIS — M25552 Pain in left hip: Secondary | ICD-10-CM | POA: Insufficient documentation

## 2019-10-10 DIAGNOSIS — M25562 Pain in left knee: Secondary | ICD-10-CM | POA: Insufficient documentation

## 2019-10-10 DIAGNOSIS — M79672 Pain in left foot: Secondary | ICD-10-CM | POA: Diagnosis present

## 2019-10-10 DIAGNOSIS — M79671 Pain in right foot: Secondary | ICD-10-CM | POA: Insufficient documentation

## 2019-10-11 ENCOUNTER — Other Ambulatory Visit
Admission: RE | Admit: 2019-10-11 | Discharge: 2019-10-11 | Disposition: A | Discharge status: Home / Self Care | Payer: BC Managed Care – PPO | Source: Ambulatory Visit | Attending: Pain Medicine | Admitting: Pain Medicine

## 2019-10-11 DIAGNOSIS — R94130 Abnormal response to nerve stimulation, unspecified: Secondary | ICD-10-CM | POA: Diagnosis not present

## 2019-10-11 DIAGNOSIS — M899 Disorder of bone, unspecified: Secondary | ICD-10-CM | POA: Diagnosis not present

## 2019-10-11 DIAGNOSIS — G894 Chronic pain syndrome: Secondary | ICD-10-CM | POA: Diagnosis not present

## 2019-10-11 DIAGNOSIS — G608 Other hereditary and idiopathic neuropathies: Secondary | ICD-10-CM | POA: Insufficient documentation

## 2019-10-11 DIAGNOSIS — Z79899 Other long term (current) drug therapy: Secondary | ICD-10-CM | POA: Diagnosis not present

## 2019-10-11 DIAGNOSIS — Z789 Other specified health status: Secondary | ICD-10-CM | POA: Diagnosis not present

## 2019-10-11 LAB — COMPREHENSIVE METABOLIC PANEL
ALT: 41 U/L (ref 0–44)
AST: 33 U/L (ref 15–41)
Albumin: 4.2 g/dL (ref 3.5–5.0)
Alkaline Phosphatase: 93 U/L (ref 38–126)
Anion gap: 13 (ref 5–15)
BUN: 10 mg/dL (ref 6–20)
CO2: 28 mmol/L (ref 22–32)
Calcium: 9.7 mg/dL (ref 8.9–10.3)
Chloride: 98 mmol/L (ref 98–111)
Creatinine, Ser: 0.64 mg/dL (ref 0.44–1.00)
GFR calc Af Amer: >60 mL/min (ref >60)
GFR calc non Af Amer: >60 mL/min (ref >60)
Glucose, Bld: 240 mg/dL — ABNORMAL HIGH (ref 70–99)
Potassium: 3.6 mmol/L (ref 3.5–5.1)
Sodium: 139 mmol/L (ref 135–145)
Total Bilirubin: 0.6 mg/dL (ref 0.3–1.2)
Total Protein: 7 g/dL (ref 6.5–8.1)

## 2019-10-11 LAB — C-REACTIVE PROTEIN: CRP: 2.8 mg/dL — ABNORMAL HIGH (ref <1.0)

## 2019-10-11 LAB — URINE DRUG SCREEN, QUALITATIVE (ARMC ONLY)
Amphetamines, Ur Screen: NOT DETECTED
Barbiturates, Ur Screen: NOT DETECTED
Benzodiazepine, Ur Scrn: NOT DETECTED
Cannabinoid 50 Ng, Ur ~~LOC~~: NOT DETECTED
Cocaine Metabolite,Ur ~~LOC~~: NOT DETECTED
MDMA (Ecstasy)Ur Screen: NOT DETECTED
Methadone Scn, Ur: NOT DETECTED
Opiate, Ur Screen: NOT DETECTED
Phencyclidine (PCP) Ur S: NOT DETECTED
Tricyclic, Ur Screen: NOT DETECTED

## 2019-10-11 LAB — VITAMIN B12: Vitamin B-12: 285 pg/mL (ref 180–914)

## 2019-10-11 LAB — SEDIMENTATION RATE: Sed Rate: 22 mm/hr (ref 0–30)

## 2019-10-11 LAB — MAGNESIUM: Magnesium: 1.8 mg/dL (ref 1.7–2.4)

## 2019-10-11 LAB — VITAMIN D 25 HYDROXY (VIT D DEFICIENCY, FRACTURES): Vit D, 25-Hydroxy: 21.99 ng/mL — ABNORMAL LOW (ref 30–100)

## 2019-10-12 LAB — MISC LABCORP TEST (SEND OUT): Labcorp test code: 348379

## 2019-10-13 LAB — COPPER, SERUM: Copper: 107 ug/dL (ref 80–158)

## 2019-10-14 LAB — VITAMIN B1: Vitamin B1 (Thiamine): 164.8 nmol/L (ref 66.5–200.0)

## 2019-10-16 LAB — VITAMIN E
Vitamin E (Alpha Tocopherol): 15.4 mg/L (ref 7.0–25.1)
Vitamin E(Gamma Tocopherol): 4.4 mg/L (ref 0.5–5.5)

## 2019-10-24 ENCOUNTER — Ambulatory Visit: Payer: BC Managed Care – PPO | Attending: Pain Medicine | Admitting: Physical Therapy

## 2019-10-27 ENCOUNTER — Encounter: Payer: Self-pay | Admitting: Pain Medicine

## 2019-10-27 NOTE — Progress Notes (Signed)
Patient: Grace Hester  Service Category: E/M  Provider: Gaspar Cola, MD  DOB: 09-Sep-1960  DOS: 10/31/2019  Location: Office  MRN: [id number]  Setting: Ambulatory outpatient  Referring Provider: Lynnell Jude, MD  Type: Established Patient  Specialty: Interventional Pain Management  PCP: Grace Jude, MD  Location: Remote location  Delivery: TeleHealth     Virtual Encounter - Pain Management PROVIDER NOTE: Information contained herein reflects review and annotations entered in association with encounter. Interpretation of such information and data should be left to medically-trained personnel. Information provided to patient can be located elsewhere in the medical record under "Patient Instructions". Document created using STT-dictation technology, any transcriptional errors that may result from process are unintentional.    Contact & Pharmacy Preferred: [place]: [phone] (home) Mobile: [phone] (mobile) E-mail: [email or web address]  [id number] [place] [address]: [phone]: [phone]  Pre-screening note:  Our staff contacted Ms. FRobin Searingand offered her an "in person", "face-to-face" appointment versus a telephone encounter. She indicated preferring the telephone encounter, at this time.   Primary Reason(s) for Virtual Visit: Encounter for evaluation before starting new chronic pain management plan of care (Level of risk: moderate) COVID-19*  Social distancing based on CDC ans AMA recommendations.    I contacted Grace Flockon 10/31/2019 via telephone.      I clearly identified myself as FGaspar Cola MD. I verified that I was speaking with the correct person using two identifiers (Name: Grace Hester and date of birth: [birth year].  Advanced Informed Consent I sought verbal advanced consent from Grace Flockfor virtual visit interactions. I informed Ms.  FRobin Searingof possible security and privacy concerns, risks, and limitations associated with providing "not-in-person" medical evaluation and management services. I also informed [name] the availability of "in-person" appointments. Finally, I informed her that there would be a charge for the virtual visit and that she could be  personally, fully or partially, financially responsible for it. Ms. FRobin Searingexpressed understanding and agreed to proceed.   Historic Elements   Ms. TJHORDAN KINTERis a 59y.o. year old, female patient evaluated today after her last encounter by our practice on 09/29/2019. Grace Hester has a past medical history of Abdominal pain, Acid reflux (01/04/2014), Acute [place] failure with hypoxemia ([place] (09/20/[place]), Anemia, Anxiety, Asthma, Benign neoplasm of sigmoid colon, Billowing mitral valve, Calculus of kidney (69/24/4628, Complication of anesthesia, Depression, Essential (primary) hypertension (01/10/2015), GERD (gastroesophageal reflux disease), Hypertension, Incomplete emptying of bladder (07/05/2014), Kidney stones, PVC (premature ventricular contraction), Sciatica of left side, Septic shock ([place] (2015), Sigmoid diverticulitis (01/27/2017), Sleep apnea, Tachycardia (04/17/2014), and Thrombocytopenia ([facility] (09/20/2014). She also  has a past surgical history that includes Augmentation mammaplasty (2008); Abdominal hysterectomy; Rotator cuff repair (Left); Flexor tendon repair (Left, 08/04/13); Kidney stone surgery (2015); Colonoscopy with propofol (N/A, 03/14/2016); Esophagogastroduodenoscopy (egd) with propofol (N/A, 03/14/2016); polypectomy (03/14/2016); Colonoscopy with propofol (N/A, 07/27/2017); Polypectomy (07/27/2017); Image guided sinus surgery; Ganglion cyst excision (Left); and Laparoscopic sigmoid colectomy (N/A, 09/21/2017). Ms. FRobin Searinghas a current medication list which includes the following prescription(s): albuterol, buspirone, esomeprazole, eucrisa,  fexofenadine, gabapentin, hydrochlorothiazide, metoprolol succinate, oxybutynin, [START ON 11/28/2019] pregabalin, [START ON 12/18/2019] pregabalin, tramadol, calcium carbonate, vitamin d3, ergocalciferol, and magnesium. She  reports that she quit smoking about 19 years ago. She has never used smokeless tobacco. She reports that she does not drink alcohol or use drugs. Grace Hester  is allergic to latex and tape.   HPI  She is being evaluated for review of studies ordered on initial visit and to consider treatment plan options. Today I went over the results of her tests. These were explained in "Layman's terms". During today's appointment I went over my diagnostic impression, as well as the proposed treatment plan.  On the patient's initial evaluation she indicated experiencing problems with both lower extremities for the past 12 years.  She indicates that the worst pain is that of her feet and ankles, bilaterally, with the left being worse than the right.  Her primary complaint is that of numbness and pain that feels sharp, electrical-like, burning sensation.  The second worst pain is that of the hips, bilaterally, with the left being worse than the right.  She denies any prior surgeries, nerve blocks, or joint injections.  The patient's third area pain is that of the knees, bilaterally, with the right being worse than the left.  Again she denies any surgeries, nerve blocks, or joint injections.  In general, she indicates that the pain is primarily on both lower extremities, where she has pain that is sharp, electrical-like, and burning over the area of her lower extremities especially in the left calf.  Other than this the only other area where she complains of having some pain is that of the hands where she has some arthritis and some eczema.  She currently denies having any low back pain, but review of the records indicate that she had some back surgery.  She seems to think that all of this started  after she had an episode of septicemia secondary to some kidney stones where she spent several days in the hospital in coma.  She has had problems with the lower extremities for a number of years but she has indicated that she wanted to try to avoid coming to the pain clinic as she was trying to get this pain under control by herself.  She did call to a neurologist, Dr. 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  She denies any diabetes and does not seem to know exactly where this peripheral neuropathy is coming from.  She indicates coming to [place] to see if there is anything that we can offer her.  Today we went over the results of her lab work, as well as x-rays.  Today I went ahead and I offered the patient a referral to podiatry to treat her calcaneal spurs but she indicated that she has seen Dr. Elvina Mattes and he was the one that actually refer her over to [place] indicating that there was not anything that he could do for her.  She continues to have this burning sensation in her feet which has been shown to be secondary to the polyneuropathy.  She is currently taking gabapentin 1 tablet in a.m., 2 tablets at 5 PM, and 2 tablets before bedtime for a total of 5 tablets/day.  She is currently taking the 300 mg tablets.  Today we will go ahead and plan on going down on her gabapentin by 1 pill every 7 days (5 weeks to completely stop it), at which time we will then begin a trial of Lyrica which she has never tried before.  We will start with the 25 mg tablet daily and we will increase it as she tolerates it.  Currently she is  having problems with her gabapentin where she indicates makes her very sleepy and she thinks is causing some aphasia.  In addition to this, we will start the patient on tramadol 50 to 100 mg p.o. at bedtime PRN.  If she fails this trial then we  will begin interventional therapies to see if we can bring this pain down.  Controlled Substance Pharmacotherapy Assessment REMS (Risk Evaluation and Mitigation Strategy)  Analgesic: None  Highest recorded MME/day: 0 mg/day MME/day: 0 mg/day   Monitoring: [place] PMP: PDMP reviewed during this encounter.       Not applicable at this point since we have not taken over the patient's medication management yet. List of other Serum/Urine Drug Screening Test(s):  Lab Results  Component Value Date   COCAINSCRNUR NONE DETECTED 10/11/2019   THCU NONE DETECTED 10/11/2019   List of all UDS test(s) done:  No results found. Last UDS on record: No results found. UDS interpretation: No unexpected findings.          Medication Assessment Form: Patient introduced to form today Treatment compliance: Treatment may start today if patient agrees with proposed plan. Evaluation of compliance is not applicable at this point Risk Assessment Profile: Aberrant behavior: See initial evaluations. None observed or detected today Comorbid factors increasing risk of overdose: See initial evaluation. No additional risks detected today Opioid risk tool (ORT):  No flowsheet data found.  ORT Scoring interpretation table:  Score <3 = Low Risk for SUD  Score between 4-7 = Moderate Risk for SUD  Score >8 = High Risk for Opioid Abuse   Risk of substance use disorder (SUD): Low  Risk Mitigation Strategies:  Patient opioid safety counseling: Completed today. Counseling provided to patient as per "Patient Counseling Document". Document signed by patient, attesting to counseling and understanding Patient-Prescriber Agreement (PPA): Obtained today.  Controlled substance notification to other providers: Written and sent today.  Pharmacologic Plan: Today we may be taking over the patient's pharmacological regimen. See below.             Meds   Current Outpatient Medications:  .  albuterol (PROVENTIL HFA;VENTOLIN HFA) 108  (90 Base) MCG/ACT inhaler, Inhale 1-2 puffs into the lungs every 6 (six) hours as needed for wheezing or shortness of breath., Disp: 1 Inhaler, Rfl: 0 .  busPIRone (BUSPAR) 10 MG tablet, Take 10 mg by mouth 2 (two) times daily. 2 tabs twice per day, Disp: , Rfl:  .  esomeprazole (NEXIUM) 20 MG capsule, Take 20 mg by mouth every morning. , Disp: , Rfl:  .  EUCRISA 2 % OINT, Apply 1 application topically at bedtime. , Disp: , Rfl: 1 .  fexofenadine (ALLEGRA) 180 MG tablet, Take 180 mg by mouth daily. , Disp: , Rfl:  .  gabapentin (NEURONTIN) 300 MG capsule, Begin decreasing your daily dose by one (1) pill every seven (7) days., Disp: 70 capsule, Rfl: 0 .  hydrochlorothiazide (HYDRODIURIL) 12.5 MG tablet, Take 12.5 mg by mouth daily. , Disp: , Rfl:  .  metoprolol succinate (TOPROL-XL) 50 MG 24 hr tablet, Take 50 mg by mouth daily. , Disp: , Rfl:  .  oxybutynin (DITROPAN) 5 MG tablet, , Disp: , Rfl:  .  [START ON 11/28/2019] pregabalin (LYRICA) 25 MG capsule, Take 1 capsule (25 mg total) by mouth daily for 7 days, THEN 1 capsule (25 mg total) 2 (two) times daily for 7 days, THEN 1 capsule (25 mg total) 3 (three) times daily for 7 days., Disp: 42 capsule,  Rfl: 0 .  [START ON 12/18/2019] pregabalin (LYRICA) 50 MG capsule, Take 1 capsule (50 mg total) by mouth 2 (two) times daily for 7 days, THEN 1 capsule (50 mg total) 3 (three) times daily for 7 days., Disp: 35 capsule, Rfl: 0 .  traMADol (ULTRAM) 50 MG tablet, Take 1-2 tablets (50-100 mg total) by mouth at bedtime as needed for severe pain. Must last 30 days, Disp: 30 tablet, Rfl: 1 .  calcium carbonate (CALCIUM 600) 600 MG TABS tablet, Take 2 tablets (1,200 mg total) by mouth daily with breakfast., Disp: 60 tablet, Rfl: 5 .  Cholecalciferol (VITAMIN D3) 125 MCG (5000 UT) CAPS, Take 1 capsule (5,000 Units total) by mouth daily with breakfast. Take along with calcium and magnesium., Disp: 30 capsule, Rfl: 5 .  ergocalciferol (VITAMIN D2) 1.25 MG (50000 UT)  capsule, Take 1 capsule (50,000 Units total) by mouth 2 (two) times a week. X 6 weeks., Disp: 12 capsule, Rfl: 0 .  Magnesium 500 MG CAPS, Take 1 capsule (500 mg total) by mouth 2 (two) times daily at 8 am and 10 pm., Disp: 60 capsule, Rfl: 5  Laboratory Chemistry Profile   Renal Lab Results  Component Value Date   BUN 10 10/11/2019   CREATININE 0.64 10/11/2019   GFRAA >60 10/11/2019   GFRNONAA >60 10/11/2019   PROTEINUR NEGATIVE 06/10/2017    Electrolytes Lab Results  Component Value Date   NA 139 10/11/2019   K 3.6 10/11/2019   CL 98 10/11/2019   CALCIUM 9.7 10/11/2019   MG 1.8 10/11/2019   PHOS 2.4 (L) 09/23/2017    Hepatic Lab Results  Component Value Date   AST 33 10/11/2019   ALT 41 10/11/2019   ALBUMIN 4.2 10/11/2019   ALKPHOS 93 10/11/2019   LIPASE 42 06/10/2017    ID Lab Results  Component Value Date   HIV NON REACTIVE 10/28/2016   MRSAPCR NEGATIVE 09/21/2017    Bone Lab Results  Component Value Date   VD25OH 21.99 (L) 10/11/2019    Endocrine Lab Results  Component Value Date   GLUCOSE 240 (H) 10/11/2019   GLUCOSEU NEGATIVE 06/10/2017   TSH 1.39 07/17/2014    Neuropathy Lab Results  Component Value Date   VITAMINB12 285 10/11/2019   HIV NON REACTIVE 10/28/2016    CNS No results found.  Inflammation (CRP: Acute  ESR: Chronic) Lab Results  Component Value Date   CRP 2.8 (H) 10/11/2019   ESRSEDRATE 22 10/11/2019   LATICACIDVEN 2.5 (HH) 06/10/2017    Rheumatology Lab Results  Component Value Date   ANA Negative 02/14/2016    Coagulation Lab Results  Component Value Date   PLT 175 10/13/2017   DDIMER (H) 07/30/2007    1.08        AT THE INHOUSE ESTABLISHED CUTOFF VALUE OF 0.48 ug/mL FEU, THIS ASSAY HAS BEEN DOCUMENTED IN THE LITERATURE TO HAVE    Cardiovascular Lab Results  Component Value Date   CKTOTAL 50 08/02/2012   CKMB < 0.5 (L) 08/02/2012   TROPONINI < 0.02 07/17/2014   HGB 13.1 10/13/2017   HCT 37.0 10/13/2017     Screening Lab Results  Component Value Date   MRSAPCR NEGATIVE 09/21/2017   HIV NON REACTIVE 10/28/2016    Cancer No results found.  Allergens No results found.     Note: Lab results reviewed.  Recent Diagnostic Imaging Review  Shoulder Imaging: Shoulder-R DG:  Results for orders placed during the hospital encounter of 03/26/15  DG Shoulder  Right   Narrative CLINICAL DATA:  Bilateral shoulder pain numbness and weakness with week grip and limited range of motion  EXAM: LEFT SHOULDER - 2+ VIEW; RIGHT SHOULDER - 2+ VIEW  COMPARISON:  Chest x-ray dated October 16, 2013 which revealed portions of the shoulders  FINDINGS: Right shoulder: The bones are adequately mineralized. The joint spaces are preserved. The subacromial subdeltoid space is normal. The observed portions of the right clavicle and upper right ribs are normal. No scapular abnormality is observed.  Left shoulder: The bones are adequately mineralized. There is no fracture nor dislocation. The joint spaces are reasonably well maintained subacromial subdeltoid space is normal. The observed portions of the left clavicle and upper left ribs are normal. The scapula is unremarkable.  IMPRESSION: There is no acute or significant chronic bony abnormality of the right shoulder.   Electronically Signed   By: David  Martinique M.D.   On: 03/26/2015 17:11    Shoulder-L DG:  Results for orders placed during the hospital encounter of 03/26/15  DG Shoulder Left   Narrative CLINICAL DATA:  Bilateral shoulder pain numbness and weakness with week grip and limited range of motion  EXAM: LEFT SHOULDER - 2+ VIEW; RIGHT SHOULDER - 2+ VIEW  COMPARISON:  Chest x-ray dated October 16, 2013 which revealed portions of the shoulders  FINDINGS: Right shoulder: The bones are adequately mineralized. The joint spaces are preserved. The subacromial subdeltoid space is normal. The observed portions of the right clavicle and upper  right ribs are normal. No scapular abnormality is observed.  Left shoulder: The bones are adequately mineralized. There is no fracture nor dislocation. The joint spaces are reasonably well maintained subacromial subdeltoid space is normal. The observed portions of the left clavicle and upper left ribs are normal. The scapula is unremarkable.  IMPRESSION: There is no acute or significant chronic bony abnormality of the right shoulder.   Electronically Signed   By: David  Martinique M.D.   On: 03/26/2015 17:11    Lumbosacral Imaging: Lumbar MR wo contrast:  Results for orders placed during the hospital encounter of 06/02/05  MR Lumbar Spine Wo Contrast   Narrative Clinical Data: Low back pain radiating into left leg.  Comparison: None.  MRI LUMBAR SPINE WITHOUT CONTRAST:  Technique: Multiplanar and multiecho pulse sequences of the lumbar spine, to include the lower thoracic and upper sacral regions, were obtained according to standard protocol, without IV contrast.  Findings: Scout images demonstrates S-shaped scoliosis of the thoracolumbar spine, convex to the right in thoracic spine and to the left in the lumbar spine. The apex in lumbar spine at approximately L1-2. Vertebral body height and alignment are maintained in the sagittal plane. Vertebral body signal is normal. Conus medullaris is normal in signal and position. T12-L1, L1-2 and L2-3 levels demonstrate normal intervertebral disks with no central canal or foraminal stenosis. There is some mild appearing facet arthropathy at the L3-4 level.  At L4-5, there is a small annular tear with a minimal central protrusion. This mildly indents the ventral thecal sac. Central canal and foramina remain widely patent. There is some mild facet arthropathy at this level.   At L5-S1, there is some facet arthropathy but the central canal and foramina are widely patent with normal appearing disk.   IMPRESSION:  1. Thoracolumbar scoliosis. This can be  better evaluated with plain radiographs as indicated.  2. Small annular tear and disk bulge at L4-5 without central canal or foraminal stenosis.  3. Mild appearing facet arthropathy  at the lower lumbar spine.    Provider: Henrietta Dine, Lonie Peak Foster   Lumbar MR w/wo contrast:  Results for orders placed during the hospital encounter of 12/31/05  MR Lumbar Spine W Wo Contrast   Narrative Clinical Data: Back pain, lower extremity pain, weakness, and numbness.   MRI LUMBAR SPINE WITHOUT AND WITH CONTRAST:  Technique: Multiplanar and multiecho pulse sequences of the lumbar spine, to include the lower thoracic region and upper sacral regions, were obtained according to standard protocol before and after administration of intravenous contrast.  Contrast:   Comparison: 06/02/05.  Findings:  The conus medullaris is at the L1 level. No malalignment is present although there is mild levoconvex lumbar scoliosis. Postoperative findings noted at the L4-5 level where there is enhancement in the posterior soft tissues.   Additional findings at individual levels are as follows:  L2-3: Unremarkable.  L3-4: Unremarkable.  L4-5: Shallow central disk protrusion noted. There is a suggestion of annular tear extending along the disk material inferiorly in the right neural foramen. The AP diameter of the thecal sac at this level is 15 mm and was previously 13 mm. There is no central, foraminal, or subarticular lateral recess stenosis identified at this level.   The patient has had prior partial left L4 and L5 laminectomies as well as medial partial left facetectomy.  L5-S1: Mild left facet overgrowth.  IMPRESSION:  1. Postoperative findings on the left at the L4-5 level. We currently do not demonstrate nerve root impingement. There is a shallow central disk protrusion at L4-5 but the central canal and foramina remain widely patent.   2. Levoconvex scoliosis.  Provider: Lavonia Dana   Hip Imaging: Hip-R DG 2-3  views:  Results for orders placed during the hospital encounter of 10/10/19  DG HIP UNILAT W OR W/O PELVIS 2-3 VIEWS RIGHT   Narrative CLINICAL DATA:  Chronic multifocal joint pain.  EXAM: DG HIP (WITH OR WITHOUT PELVIS) 2-3V RIGHT; DG HIP (WITH OR WITHOUT PELVIS) 2-3V LEFT  COMPARISON:  CT abdomen and pelvis 10/14/2017.  FINDINGS: There is no evidence of hip fracture or dislocation. Mild bilateral hip joint space narrowing is noted. There is partial visualization of convex left lumbar scoliosis.  IMPRESSION: Mild bilateral hip degenerative change.  Partial visualization of convex left lumbar scoliosis.   Electronically Signed   By: Inge Rise M.D.   On: 10/11/2019 08:19    Hip-L DG 2-3 views:  Results for orders placed during the hospital encounter of 10/10/19  DG HIP UNILAT W OR W/O PELVIS 2-3 VIEWS LEFT   Narrative CLINICAL DATA:  Chronic multifocal joint pain.  EXAM: DG HIP (WITH OR WITHOUT PELVIS) 2-3V RIGHT; DG HIP (WITH OR WITHOUT PELVIS) 2-3V LEFT  COMPARISON:  CT abdomen and pelvis 10/14/2017.  FINDINGS: There is no evidence of hip fracture or dislocation. Mild bilateral hip joint space narrowing is noted. There is partial visualization of convex left lumbar scoliosis.  IMPRESSION: Mild bilateral hip degenerative change.  Partial visualization of convex left lumbar scoliosis.   Electronically Signed   By: Inge Rise M.D.   On: 10/11/2019 08:19    Knee Imaging: Knee-R DG 1-2 views:  Results for orders placed during the hospital encounter of 10/10/19  DG Knee 1-2 Views Right   Narrative CLINICAL DATA:  Chronic multifocal joint pain.  EXAM: RIGHT KNEE - 1-2 VIEW; LEFT KNEE - 1-2 VIEW  COMPARISON:  None.  FINDINGS: No evidence of fracture, dislocation, or joint effusion. No evidence of  arthropathy or other focal bone abnormality. Soft tissues are unremarkable. No joint effusion or chondrocalcinosis.  IMPRESSION: Negative  exam.   Electronically Signed   By: Inge Rise M.D.   On: 10/11/2019 08:20    Knee-L DG 1-2 views:  Results for orders placed during the hospital encounter of 10/10/19  DG Knee 1-2 Views Left   Narrative CLINICAL DATA:  Chronic multifocal joint pain.  EXAM: RIGHT KNEE - 1-2 VIEW; LEFT KNEE - 1-2 VIEW  COMPARISON:  None.  FINDINGS: No evidence of fracture, dislocation, or joint effusion. No evidence of arthropathy or other focal bone abnormality. Soft tissues are unremarkable. No joint effusion or chondrocalcinosis.  IMPRESSION: Negative exam.   Electronically Signed   By: Inge Rise M.D.   On: 10/11/2019 08:20    Ankle Imaging: Ankle-R DG Complete:  Results for orders placed during the hospital encounter of 10/10/19  DG Ankle Complete Right   Narrative CLINICAL DATA:  Chronic multifocal joint pain.  EXAM: LEFT ANKLE COMPLETE - 3+ VIEW; RIGHT ANKLE - COMPLETE 3+ VIEW  COMPARISON:  None.  FINDINGS: There is no evidence of fracture, dislocation, or joint effusion. There is no evidence of arthropathy or other focal bone abnormality. Very small bilateral plantar calcaneal spurs noted. Soft tissues are unremarkable.  IMPRESSION: Negative for arthropathy or acute abnormality.  Very small bilateral plantar calcaneal spurs.   Electronically Signed   By: Inge Rise M.D.   On: 10/11/2019 08:21    Ankle-L DG Complete:  Results for orders placed during the hospital encounter of 10/10/19  DG Ankle Complete Left   Narrative CLINICAL DATA:  Chronic multifocal joint pain.  EXAM: LEFT ANKLE COMPLETE - 3+ VIEW; RIGHT ANKLE - COMPLETE 3+ VIEW  COMPARISON:  None.  FINDINGS: There is no evidence of fracture, dislocation, or joint effusion. There is no evidence of arthropathy or other focal bone abnormality. Very small bilateral plantar calcaneal spurs noted. Soft tissues are unremarkable.  IMPRESSION: Negative for arthropathy or acute  abnormality.  Very small bilateral plantar calcaneal spurs.   Electronically Signed   By: Inge Rise M.D.   On: 10/11/2019 08:21    Foot Imaging: Foot-R DG Complete:  Results for orders placed during the hospital encounter of 10/10/19  DG Foot Complete Right   Narrative CLINICAL DATA:  Chronic multifocal joint pain.  EXAM: LEFT FOOT - COMPLETE 3+ VIEW; RIGHT FOOT COMPLETE - 3+ VIEW  COMPARISON:  Plain films of the left foot 01/05/2012 and plain films of the right foot 06/17/2012.  FINDINGS: There is no acute bony or joint abnormality. Joint spaces and alignment are preserved. Mineralization is normal. No erosion, osteophytosis or periostitis. Small bilateral plantar calcaneal spurs noted.  IMPRESSION: Small bilateral plantar calcaneal spurs.  Otherwise negative.   Electronically Signed   By: Inge Rise M.D.   On: 10/11/2019 08:23    Foot-L DG Complete:  Results for orders placed during the hospital encounter of 10/10/19  DG Foot Complete Left   Narrative CLINICAL DATA:  Chronic multifocal joint pain.  EXAM: LEFT FOOT - COMPLETE 3+ VIEW; RIGHT FOOT COMPLETE - 3+ VIEW  COMPARISON:  Plain films of the left foot 01/05/2012 and plain films of the right foot 06/17/2012.  FINDINGS: There is no acute bony or joint abnormality. Joint spaces and alignment are preserved. Mineralization is normal. No erosion, osteophytosis or periostitis. Small bilateral plantar calcaneal spurs noted.  IMPRESSION: Small bilateral plantar calcaneal spurs.  Otherwise negative.   Electronically Signed   By:  Inge Rise M.D.   On: 10/11/2019 08:23    Complexity Note: Imaging results reviewed. Results shared with Ms. Suderman, using [employer].                        Assessment  The primary encounter diagnosis was Polyneuropathy, peripheral sensorimotor axonal. Diagnoses of Osteoarthritis of hips (Bilateral), Calcaneal spur of both feet, Neurogenic pain,  Levoscoliosis, Chronic pain syndrome, and Vitamin D insufficiency were also pertinent to this visit.  Plan of Care  I have changed Grace Hester gabapentin. I am also having her start on pregabalin, pregabalin, traMADol, ergocalciferol, Vitamin D3, Magnesium, and calcium carbonate. Additionally, I am having her maintain her esomeprazole, fexofenadine, hydrochlorothiazide, metoprolol succinate, albuterol, busPIRone, Eucrisa, and oxybutynin. Pharmacotherapy (Medications Ordered): Meds ordered this encounter  Medications  . gabapentin (NEURONTIN) 300 MG capsule    Sig: Begin decreasing your daily dose by one (1) pill every seven (7) days.    Dispense:  70 capsule    Refill:  0    Cancel whatever refills were left on her prior gabapentin prescription.  . pregabalin (LYRICA) 25 MG capsule    Sig: Take 1 capsule (25 mg total) by mouth daily for 7 days, THEN 1 capsule (25 mg total) 2 (two) times daily for 7 days, THEN 1 capsule (25 mg total) 3 (three) times daily for 7 days.    Dispense:  42 capsule    Refill:  0    Fill one day early if pharmacy is closed on scheduled refill date. May substitute for generic if available.  . pregabalin (LYRICA) 50 MG capsule    Sig: Take 1 capsule (50 mg total) by mouth 2 (two) times daily for 7 days, THEN 1 capsule (50 mg total) 3 (three) times daily for 7 days.    Dispense:  35 capsule    Refill:  0    Fill one day early if pharmacy is closed on scheduled refill date. May substitute for generic if available.  . traMADol (ULTRAM) 50 MG tablet    Sig: Take 1-2 tablets (50-100 mg total) by mouth at bedtime as needed for severe pain. Must last 30 days    Dispense:  30 tablet    Refill:  1    Chronic Pain: STOP Act (Not applicable) Fill 1 day early if closed on refill date. Do not fill until: 10/31/2019. To last until: 12/30/2019. Avoid benzodiazepines within 8 hours of opioids  . ergocalciferol (VITAMIN D2) 1.25 MG (50000 UT) capsule    Sig: Take 1 capsule  (50,000 Units total) by mouth 2 (two) times a week. X 6 weeks.    Dispense:  12 capsule    Refill:  0    Fill one day early if pharmacy is closed on scheduled refill date. May substitute for generic if available.  . Cholecalciferol (VITAMIN D3) 125 MCG (5000 UT) CAPS    Sig: Take 1 capsule (5,000 Units total) by mouth daily with breakfast. Take along with calcium and magnesium.    Dispense:  30 capsule    Refill:  5    Fill one day early if pharmacy is closed on scheduled refill date. May substitute for generic if available. May substitute with similar over-the-counter product.  . Magnesium 500 MG CAPS    Sig: Take 1 capsule (500 mg total) by mouth 2 (two) times daily at 8 am and 10 pm.    Dispense:  60 capsule    Refill:  5    Fill one day early if pharmacy is closed on scheduled refill date. May substitute for generic if available. May substitute with similar over-the-counter product.  . calcium carbonate (CALCIUM 600) 600 MG TABS tablet    Sig: Take 2 tablets (1,200 mg total) by mouth daily with breakfast.    Dispense:  60 tablet    Refill:  5    Fill one day early if pharmacy is closed on scheduled refill date. May substitute for generic if available.   Procedure Orders    No procedure(s) ordered today   Lab Orders  No laboratory test(s) ordered today   Imaging Orders  No imaging studies ordered today   Referral Orders  No referral(s) requested today   Orders:  No orders of the defined types were placed in this encounter.  Pharmacological management options:  Opioid Analgesics: We'll take over management today. See above orders Membrane stabilizer: Options discussed, including a trial. Muscle relaxant: We have discussed the possibility of a trial NSAID: Trial discussed. Other analgesic(s): To be determined at a later time    Interventional management options: Planned, scheduled, and/or pending:    Taper down gabapentin.  Titrate Lyrica for a trial.  Tramadol at bedtime  trial.  If this fails, then we will begin interventional therapies.   Under consideration:   Diagnostic caudal ESI #1 + diagnostic epidurogram #1  Diagnostic bilateral LSB #1  Possible spinal cord stimulator trial    PRN Procedures:   None at this time    Total duration of non-face-to-face encounter: 18 minutes.  Follow-up plan:   Return in about 8 weeks (around 12/28/2019) for (VV), (MM) to evaluate Lyrica titration and tramadol trial.    Recent Visits Date Type Provider Dept  10/03/19 Telemedicine Milinda Pointer, MD [facility]  Showing recent visits within past 90 days and meeting all other requirements   Today's Visits Date Type Provider Dept  10/31/19 Telemedicine Milinda Pointer, MD [facility]  Showing today's visits and meeting all other requirements   Future Appointments No visits were found meeting these conditions.  Showing future appointments within next 90 days and meeting all other requirements   Primary Care Physician: Grace Jude, MD Location: Telephone Virtual Visit Note by: Grace Cola, MD Date: 10/31/2019; Time: 11:44 AM  Note: This dictation was prepared with Dragon dictation. Any transcriptional errors that may result from this process are unintentional.  Disclaimer:  * Given the special circumstances of the COVID-19 pandemic, the federal government has announced that the Office for Civil Rights (OCR) will exercise its enforcement discretion and will not impose penalties on physicians using telehealth in the event of noncompliance with regulatory requirements under the [place] (HIPAA) in connection with the good faith provision of telehealth during the VOHKG-67 national public health emergency. ([place])

## 2019-10-31 ENCOUNTER — Encounter: Payer: BC Managed Care – PPO | Admitting: Physical Therapy

## 2019-10-31 ENCOUNTER — Other Ambulatory Visit: Payer: Self-pay

## 2019-10-31 ENCOUNTER — Ambulatory Visit: Payer: BC Managed Care – PPO | Attending: Pain Medicine | Admitting: Pain Medicine

## 2019-10-31 DIAGNOSIS — M7731 Calcaneal spur, right foot: Secondary | ICD-10-CM

## 2019-10-31 DIAGNOSIS — G894 Chronic pain syndrome: Secondary | ICD-10-CM

## 2019-10-31 DIAGNOSIS — M7732 Calcaneal spur, left foot: Secondary | ICD-10-CM

## 2019-10-31 DIAGNOSIS — M160 Bilateral primary osteoarthritis of hip: Secondary | ICD-10-CM | POA: Diagnosis not present

## 2019-10-31 DIAGNOSIS — G608 Other hereditary and idiopathic neuropathies: Secondary | ICD-10-CM | POA: Diagnosis not present

## 2019-10-31 DIAGNOSIS — E559 Vitamin D deficiency, unspecified: Secondary | ICD-10-CM

## 2019-10-31 DIAGNOSIS — M792 Neuralgia and neuritis, unspecified: Secondary | ICD-10-CM

## 2019-10-31 DIAGNOSIS — M4180 Other forms of scoliosis, site unspecified: Secondary | ICD-10-CM

## 2019-10-31 MED ORDER — PREGABALIN 25 MG PO CAPS: ORAL_CAPSULE | ORAL | 0 refills | Status: DC

## 2019-10-31 MED ORDER — CALCIUM CARBONATE 600 MG PO TABS: 1200.0000 mg | ORAL_TABLET | Freq: Every day | ORAL | 5 refills | Status: AC

## 2019-10-31 MED ORDER — MAGNESIUM 500 MG PO CAPS: 500.0000 mg | ORAL_CAPSULE | Freq: Two times a day (BID) | ORAL | 5 refills | Status: AC

## 2019-10-31 MED ORDER — TRAMADOL HCL 50 MG PO TABS: 50.0000 mg | ORAL_TABLET | Freq: Every evening | ORAL | 1 refills | Status: DC | PRN

## 2019-10-31 MED ORDER — VITAMIN D3 125 MCG (5000 UT) PO CAPS: 1.0000 | ORAL_CAPSULE | Freq: Every day | ORAL | 5 refills | Status: AC

## 2019-10-31 MED ORDER — GABAPENTIN 300 MG PO CAPS: ORAL_CAPSULE | ORAL | 0 refills | Status: DC

## 2019-10-31 MED ORDER — ERGOCALCIFEROL 1.25 MG (50000 UT) PO CAPS: 50000.0000 [IU] | ORAL_CAPSULE | ORAL | 0 refills | Status: AC

## 2019-10-31 MED ORDER — PREGABALIN 50 MG PO CAPS: ORAL_CAPSULE | ORAL | 0 refills | Status: DC

## 2019-10-31 NOTE — Patient Instructions (Addendum)
Tramadol tablets What is this medicine? TRAMADOL (TRA ma dole) is a pain reliever. It is used to treat moderate to severe pain in adults. This medicine Rezendes be used for other purposes; ask your health care provider or pharmacist if you have questions. COMMON BRAND NAME(S): Ultram What should I tell my health care provider before I take this medicine? They need to know if you have any of these conditions:  brain tumor  depression  drug abuse or addiction  head injury  if you frequently drink alcohol containing drinks  kidney disease or trouble passing urine  liver disease  lung disease, asthma, or breathing problems  seizures or epilepsy  suicidal thoughts, plans, or attempt; a previous suicide attempt by you or a family member  an unusual or allergic reaction to tramadol, codeine, other medicines, foods, dyes, or preservatives  pregnant or trying to get pregnant  breast-feeding How should I use this medicine? Take this medicine by mouth with a full glass of water. Follow the directions on the prescription label. You can take it with or without food. If it upsets your stomach, take it with food. Do not take your medicine more often than directed. A special MedGuide will be given to you by the pharmacist with each prescription and refill. Be sure to read this information carefully each time. Talk to your pediatrician regarding the use of this medicine in children. Special care Berne be needed. Overdosage: If you think you have taken too much of this medicine contact a poison control center or emergency room at once. NOTE: This medicine is only for you. Do not share this medicine with others. What if I miss a dose? If you miss a dose, take it as soon as you can. If it is almost time for your next dose, take only that dose. Do not take double or extra doses. What Kabler interact with this medicine? Do not take this medication with any of the following medicines:  MAOIs like Carbex,  Eldepryl, Marplan, Nardil, and Parnate This medicine Blew also interact with the following medications:  alcohol  antihistamines for allergy, cough and cold  certain medicines for anxiety or sleep  certain medicines for depression like amitriptyline, fluoxetine, sertraline  certain medicines for migraine headache like almotriptan, eletriptan, frovatriptan, naratriptan, rizatriptan, sumatriptan, zolmitriptan  certain medicines for seizures like carbamazepine, oxcarbazepine, phenobarbital, primidone  certain medicines that treat or prevent blood clots like warfarin  digoxin  furazolidone  general anesthetics like halothane, isoflurane, methoxyflurane, propofol  linezolid  local anesthetics like lidocaine, pramoxine, tetracaine  medicines that relax muscles for surgery  other narcotic medicines for pain or cough  phenothiazines like chlorpromazine, mesoridazine, prochlorperazine, thioridazine  procarbazine This list Sobek not describe all possible interactions. Give your health care provider a list of all the medicines, herbs, non-prescription drugs, or dietary supplements you use. Also tell them if you smoke, drink alcohol, or use illegal drugs. Some items Salone interact with your medicine. What should I watch for while using this medicine? Tell your health care provider if your pain does not go away, if it gets worse, or if you have new or a different type of pain. You Pollio develop tolerance to this drug. Tolerance means that you will need a higher dose of the drug for pain relief. Tolerance is normal and is expected if you take this drug for a long time. Do not suddenly stop taking your drug because you Ashe develop a severe reaction. Your body becomes used to the   drug. This does NOT mean you are addicted. Addiction is a behavior related to getting and using a drug for a nonmedical reason. If you have pain, you have a medical reason to take pain drug. Your health care provider will tell  you how much drug to take. If your health care provider wants you to stop the drug, the dose will be slowly lowered over time to avoid any side effects. If you take other drugs that also cause drowsiness like other narcotic pain drugs, benzodiazepines, or other drugs for sleep, you may have more side effects. Give your health care provider a list of all drugs you use. He or she will tell you how much drug to take. Do not take more drug than directed. Get emergency help right away if you have trouble breathing or are unusually tired or sleepy. Talk to your health care provider about naloxone and how to get it. Naloxone is an emergency drug used for an opioid overdose. An overdose can happen if you take too much opioid. It can also happen if an opioid is taken with some other drugs or substances, like alcohol. Know the symptoms of an overdose, like trouble breathing, unusually tired or sleepy, or not being able to respond or wake up. Make sure to tell caregivers and close contacts where it is stored. Make sure they know how to use it. After naloxone is given, you must get emergency help right away. Naloxone is a temporary treatment. Repeat doses may be needed. This drug may cause serious skin reactions. They can happen weeks to months after starting the drug. Contact your health care provider right away if you notice fevers or flu-like symptoms with a rash. The rash may be red or purple and then turn into blisters or peeling of the skin. Or, you might notice a red rash with swelling of the face, lips, or lymph nodes in your neck or under your arms. You may get drowsy or dizzy. Do not drive, use machinery, or do anything that needs mental alertness until you know how this drug affects you. Do not stand up or sit up quickly, especially if you are an older patient. This reduces the risk of dizzy or fainting spells. Alcohol may interfere with the effect of this drug. Avoid alcoholic drinks. This drug will cause  constipation. If you do not have a bowel movement for 3 days, call your health care provider. Your mouth may get dry. Chewing sugarless gum or sucking hard candy and drinking plenty of water may help. Contact your health care provider if the problem does not go away or is severe. What side effects may I notice from receiving this medicine? Side effects that you should report to your doctor or health care professional as soon as possible:  allergic reactions like skin rash, itching or hives, swelling of the face, lips, or tongue  breathing problems  confusion  redness, blistering, peeling or loosening of the skin, including inside the mouth  seizures  signs and symptoms of low blood pressure like dizziness; feeling faint or lightheaded, falls; unusually weak or tired  trouble passing urine or change in the amount of urine Side effects that usually do not require medical attention (report to your doctor or health care professional if they continue or are bothersome):  constipation  dry mouth  nausea, vomiting  tiredness This list may not describe all possible side effects. Call your doctor for medical advice about side effects. You may report side effects to   FDA at 1-800-FDA-1088. Where should I keep my medicine? Keep out of the reach of children. This medicine may cause accidental overdose and death if it taken by other adults, children, or pets. Mix any unused medicine with a substance like cat litter or coffee grounds. Then throw the medicine away in a sealed container like a sealed bag or a coffee can with a lid. Do not use the medicine after the expiration date. Store at room temperature between 15 and 30 degrees C (59 and 86 degrees F). NOTE: This sheet is a summary. It may not cover all possible information. If you have questions about this medicine, talk to your doctor, pharmacist, or health care provider.  2020 Elsevier/Gold Standard (2019-03-21  13:08:25)  _________________________________________________________________________________  Pregabalin capsules What is this medicine? PREGABALIN (pre GAB a lin) is used to treat nerve pain from diabetes, shingles, spinal cord injury, and fibromyalgia. It is also used to control seizures in epilepsy. This medicine may be used for other purposes; ask your health care provider or pharmacist if you have questions. COMMON BRAND NAME(S): Lyrica What should I tell my health care provider before I take this medicine? They need to know if you have any of these conditions:  heart disease  history of drug abuse or alcohol abuse problem  kidney disease  lung or breathing disease  suicidal thoughts, plans, or attempt; a previous suicide attempt by you or a family member  an unusual or allergic reaction to pregabalin, gabapentin, other medicines, foods, dyes, or preservatives  pregnant or trying to get pregnant  breast-feeding How should I use this medicine? Take this medicine by mouth with a glass of water. Follow the directions on the prescription label. You can take it with or without food. If it upsets your stomach, take it with food. Take your medicine at regular intervals. Do not take it more often than directed. Do not stop taking except on your doctor's advice. A special MedGuide will be given to you by the pharmacist with each prescription and refill. Be sure to read this information carefully each time. Talk to your pediatrician regarding the use of this medicine in children. While this drug may be prescribed for children as young as 1 month for selected conditions, precautions do apply. Overdosage: If you think you have taken too much of this medicine contact a poison control center or emergency room at once. NOTE: This medicine is only for you. Do not share this medicine with others. What if I miss a dose? If you miss a dose, take it as soon as you can. If it is almost time for  your next dose, take only that dose. Do not take double or extra doses. What may interact with this medicine? This medicine may interact with the following medications:  alcohol  antihistamines for allergy, cough, and cold  certain medicines for anxiety or sleep  certain medicines for depression like amitriptyline, fluoxetine, sertraline  certain medicines for diabetes  certain medicines for seizures like phenobarbital, primidone  general anesthetics like halothane, isoflurane, methoxyflurane, propofol  local anesthetics like lidocaine, pramoxine, tetracaine  medicines that relax muscles for surgery  narcotic medicines for pain  phenothiazines like chlorpromazine, mesoridazine, prochlorperazine, thioridazine This list may not describe all possible interactions. Give your health care provider a list of all the medicines, herbs, non-prescription drugs, or dietary supplements you use. Also tell them if you smoke, drink alcohol, or use illegal drugs. Some items may interact with your medicine. What should I watch  for while using this medicine? Tell your doctor or healthcare professional if your symptoms do not start to get better or if they get worse. Visit your doctor or health care professional for regular checks on your progress. Do not stop taking except on your doctor's advice. You may develop a severe reaction. Your doctor will tell you how much medicine to take. Wear a medical identification bracelet or chain if you are taking this medicine for seizures, and carry a card that describes your disease and details of your medicine and dosage times. You may get drowsy or dizzy. Do not drive, use machinery, or do anything that needs mental alertness until you know how this medicine affects you. Do not stand or sit up quickly, especially if you are an older patient. This reduces the risk of dizzy or fainting spells. Alcohol may interfere with the effect of this medicine. Avoid alcoholic  drinks. If you have a heart condition, like congestive heart failure, and notice that you are retaining water and have swelling in your hands or feet, contact your health care provider immediately. The use of this medicine may increase the chance of suicidal thoughts or actions. Pay special attention to how you are responding while on this medicine. Any worsening of mood, or thoughts of suicide or dying should be reported to your health care professional right away. This medicine has caused reduced sperm counts in some men. This may interfere with the ability to father a child. You should talk to your doctor or health care professional if you are concerned about your fertility. Women who become pregnant while using this medicine for seizures may enroll in the [place] Pregnancy Registry by calling [phone]. This registry collects information about the safety of antiepileptic drug use during pregnancy. What side effects may I notice from receiving this medicine? Side effects that you should report to your doctor or health care professional as soon as possible:  allergic reactions like skin rash, itching or hives, swelling of the face, lips, or tongue  breathing problems  changes in vision  chest pain  confusion  jerking or unusual movements of any part of your body  loss of memory  muscle pain, tenderness, or weakness  suicidal thoughts or other mood changes  swelling of the ankles, feet, hands  unusual bruising or bleeding Side effects that usually do not require medical attention (report to your doctor or health care professional if they continue or are bothersome):  dizziness  drowsiness  dry mouth  headache  nausea  tremors  trouble sleeping  weight gain This list may not describe all possible side effects. Call your doctor for medical advice about side effects. You may report side effects to FDA at 1-800-FDA-1088. Where should I keep  my medicine? Keep out of the reach of children. This medicine can be abused. Keep your medicine in a safe place to protect it from theft. Do not share this medicine with anyone. Selling or giving away this medicine is dangerous and against the law. This medicine may cause accidental overdose and death if it taken by other adults, children, or pets. Mix any unused medicine with a substance like cat litter or coffee grounds. Then throw the medicine away in a sealed container like a sealed bag or a coffee can with a lid. Do not use the medicine after the expiration date. Store at room temperature between 15 and 30 degrees C (59 and 86 degrees F). NOTE: This sheet is a summary.  It may not cover all possible information. If you have questions about this medicine, talk to your doctor, pharmacist, or health care provider.  2020 Elsevier/Gold Standard (2018-08-13 13:15:55) _________________________________________________________________________________________  ____________________________________________________________________________________________  Medication Rules  Purpose: To inform patients, and their family members, of our rules and regulations.  Applies to: All patients receiving prescriptions (written or electronic).  Pharmacy of record: Pharmacy where electronic prescriptions will be sent. If written prescriptions are taken to a different pharmacy, please inform the nursing staff. The pharmacy listed in the electronic medical record should be the one where you would like electronic prescriptions to be sent.  Electronic prescriptions: In compliance with the [place] (STOP) Act of 2017 (Session Lanny Cramp [phone]), effective August 25, 2018, all controlled substances must be electronically prescribed. Calling prescriptions to the pharmacy will cease to exist.  Prescription refills: Only during scheduled appointments. Applies to all  prescriptions.  NOTE: The following applies primarily to controlled substances (Opioid* Pain Medications).   Patient's responsibilities: 1. Pain Pills: Bring all pain pills to every appointment (except for procedure appointments). 2. Pill Bottles: Bring pills in original pharmacy bottle. Always bring the newest bottle. Bring bottle, even if empty. 3. Medication refills: You are responsible for knowing and keeping track of what medications you take and those you need refilled. The day before your appointment: write a list of all prescriptions that need to be refilled. The day of the appointment: give the list to the admitting nurse. Prescriptions will be written only during appointments. No prescriptions will be written on procedure days. If you forget a medication: it will not be "Called in", "Faxed", or "electronically sent". You will need to get another appointment to get these prescribed. No early refills. Do not call asking to have your prescription filled early. 4. Prescription Accuracy: You are responsible for carefully inspecting your prescriptions before leaving our office. Have the discharge nurse carefully go over each prescription with you, before taking them home. Make sure that your name is accurately spelled, that your address is correct. Check the name and dose of your medication to make sure it is accurate. Check the number of pills, and the written instructions to make sure they are clear and accurate. Make sure that you are given enough medication to last until your next medication refill appointment. 5. Taking Medication: Take medication as prescribed. When it comes to controlled substances, taking less pills or less frequently than prescribed is permitted and encouraged. Never take more pills than instructed. Never take medication more frequently than prescribed.  6. Inform other Doctors: Always inform, all of your healthcare providers, of all the medications you take. 7. Pain  Medication from other Providers: You are not allowed to accept any additional pain medication from any other Doctor or Healthcare provider. There are two exceptions to this rule. (see below) In the event that you require additional pain medication, you are responsible for notifying us, as stated below. 8. Medication Agreement: You are responsible for carefully reading and following our Medication Agreement. This must be signed before receiving any prescriptions from our practice. Safely store a copy of your signed Agreement. Violations to the Agreement will result in no further prescriptions. (Additional copies of our Medication Agreement are available upon request.) 9. Laws, Rules, & Regulations: All patients are expected to follow all Federal and [employer], [employer], Rules, [employer] [facility]. Ignorance of the Laws does not constitute a valid excuse.  10. Illegal drugs and Controlled Substances: The use of illegal  substances (including, but not limited to marijuana and its derivatives) and/or the illegal use of any controlled substances is strictly prohibited. Violation of this rule may result in the immediate and permanent discontinuation of any and all prescriptions being written by our practice. The use of any illegal substances is prohibited. 11. Adopted CDC guidelines & recommendations: Target dosing levels will be at or below 60 MME/day. Use of benzodiazepines** is not recommended.  Exceptions: There are only two exceptions to the rule of not receiving pain medications from other Healthcare Providers. 1. Exception #1 (Emergencies): In the event of an emergency (i.e.: accident requiring emergency care), you are allowed to receive additional pain medication. However, you are responsible for: As soon as you are able, call our office [phone], at any time of the day or night, and leave a message stating your name, the date and nature of the emergency, and the name and dose of the medication prescribed.  In the event that your call is answered by a member of our staff, make sure to document and save the date, time, and the name of the person that took your information.  2. Exception #2 (Planned Surgery): In the event that you are scheduled by another doctor or dentist to have any type of surgery or procedure, you are allowed (for a period no longer than 30 days), to receive additional pain medication, for the acute post-op pain. However, in this case, you are responsible for picking up a copy of our "Post-op Pain Management for Surgeons" handout, and giving it to your surgeon or dentist. This document is available at our office, and does not require an appointment to obtain it. Simply go to our office during business hours (Monday-Thursday from 8:00 AM to 4:00 PM) (Friday 8:00 AM to 12:00 Noon) or if you have a scheduled appointment with [place], prior to your surgery, and ask for it by name. In addition, you will need to provide [place] with your name, name of your surgeon, type of surgery, and date of procedure or surgery.  *Opioid medications include: morphine, codeine, oxycodone, oxymorphone, hydrocodone, hydromorphone, meperidine, tramadol, tapentadol, buprenorphine, fentanyl, methadone. **Benzodiazepine medications include: diazepam (Valium), alprazolam (Xanax), clonazepam (Klonopine), lorazepam (Ativan), clorazepate (Tranxene), chlordiazepoxide (Librium), estazolam (Prosom), oxazepam (Serax), temazepam (Restoril), triazolam (Halcion) (Last updated: 10/22/2017) ____________________________________________________________________________________________   ____________________________________________________________________________________________  Medication Recommendations and Reminders  Applies to: All patients receiving prescriptions (written and/or electronic).  Medication Rules & Regulations: These rules and regulations exist for your safety and that of others. They are not flexible and neither are  we. Dismissing or ignoring them will be considered "non-compliance" with medication therapy, resulting in complete and irreversible termination of such therapy. (See document titled "Medication Rules" for more details.) In all conscience, because of safety reasons, we cannot continue providing a therapy where the patient does not follow instructions.  Pharmacy of record:   Definition: This is the pharmacy where your electronic prescriptions will be sent.   We do not endorse any particular pharmacy.  You are not restricted in your choice of pharmacy.  The pharmacy listed in the electronic medical record should be the one where you want electronic prescriptions to be sent.  If you choose to change pharmacy, simply notify our nursing staff of your choice of new pharmacy.  Recommendations:  Keep all of your pain medications in a safe place, under lock and key, even if you live alone.   After you fill your prescription, take 1 week's worth of pills and put them  away in a safe place. You should keep a separate, properly labeled bottle for this purpose. The remainder should be kept in the original bottle. Use this as your primary supply, until it runs out. Once it's gone, then you know that you have 1 week's worth of medicine, and it is time to come in for a prescription refill. If you do this correctly, it is unlikely that you will ever run out of medicine.  To make sure that the above recommendation works, it is very important that you make sure your medication refill appointments are scheduled at least 1 week before you run out of medicine. To do this in an effective manner, make sure that you do not leave the office without scheduling your next medication management appointment. Always ask the nursing staff to show you in your prescription , when your medication will be running out. Then arrange for the receptionist to get you a return appointment, at least 7 days before you run out of medicine. Do  not wait until you have 1 or 2 pills left, to come in. This is very poor planning and does not take into consideration that we may need to cancel appointments due to bad weather, sickness, or emergencies affecting our staff.  "Partial Fill": If for any reason your pharmacy does not have enough pills/tablets to completely fill or refill your prescription, do not allow for a "partial fill". You will need a separate prescription to fill the remaining amount, which we will not provide. If the reason for the partial fill is your insurance, you will need to talk to the pharmacist about payment alternatives for the remaining tablets, but again, do not accept a partial fill.  Prescription refills and/or changes in medication(s):   Prescription refills, and/or changes in dose or medication, will be conducted only during scheduled medication management appointments. (Applies to both, written and electronic prescriptions.)  No refills on procedure days. No medication will be changed or started on procedure days. No changes, adjustments, and/or refills will be conducted on a procedure day. Doing so will interfere with the diagnostic portion of the procedure.  No phone refills. No medications will be "called into the pharmacy".  No Fax refills.  No weekend refills.  No Holliday refills.  No after hours refills.  Remember:  Business hours are:  Monday to Thursday 8:00 AM to 4:00 PM Provider's Schedule: Milinda Pointer, MD - Appointments are:  Medication management: Monday and Wednesday 8:00 AM to 4:00 PM Procedure day: Tuesday and Thursday 7:30 AM to 4:00 PM Gillis Santa, MD - Appointments are:  Medication management: Tuesday and Thursday 8:00 AM to 4:00 PM Procedure day: Monday and Wednesday 7:30 AM to 4:00 PM (Last update:  10/22/2017) ____________________________________________________________________________________________   ____________________________________________________________________________________________  CANNABIDIOL (AKA: CBD Oil or Pills)  Applies to: All patients receiving prescriptions of controlled substances (written and/or electronic).  General Information: Cannabidiol (CBD) was discovered in 74. It is one of some 113 identified cannabinoids in cannabis (Marijuana) plants, accounting for up to 40% of the plant's extract. As of 2018, preliminary clinical research on cannabidiol included studies of anxiety, cognition, movement disorders, and pain.  Cannabidiol is consummed in multiple ways, including inhalation of cannabis smoke or vapor, as an aerosol spray into the cheek, and by mouth. It may be supplied as CBD oil containing CBD as the active ingredient (no added tetrahydrocannabinol (THC) or terpenes), a full-plant CBD-dominant hemp extract oil, capsules, dried cannabis, or as a liquid solution. CBD is thought not  have the same psychoactivity as THC, and may affect the actions of THC. Studies suggest that CBD may interact with different biological targets, including cannabinoid receptors and other neurotransmitter receptors. As of 2018 the mechanism of action for its biological effects has not been determined.  In the [place], cannabidiol has a limited approval by the Food and Drug Administration (FDA) for treatment of only two types of epilepsy disorders. The side effects of long-term use of the drug include somnolence, decreased appetite, diarrhea, fatigue, malaise, weakness, sleeping problems, and others.  CBD remains a Schedule I drug prohibited for any use.  Legality: Some manufacturers ship CBD products nationally, an illegal action which the FDA has not enforced in 2018, with CBD remaining the subject of an FDA investigational new drug evaluation, and is not considered legal as  a dietary supplement or food ingredient as of December 2018. Federal illegality has made it difficult historically to conduct research on CBD. CBD is openly sold in head shops and health food stores in some states where such sales have not been explicitly legalized.  Warning: Because it is not FDA approved for general use or treatment of pain, it is not required to undergo the same manufacturing controls as prescription drugs.  This means that the available cannabidiol (CBD) may be contaminated with THC.  If this is the case, it will trigger a positive urine drug screen (UDS) test for cannabinoids (Marijuana).  Because a positive UDS for illicit substances is a violation of our medication agreement, your opioid analgesics (pain medicine) may be permanently discontinued. (Last update: 11/12/2017) ____________________________________________________________________________________________

## 2019-11-02 ENCOUNTER — Encounter: Payer: BC Managed Care – PPO | Admitting: Physical Therapy

## 2019-11-07 ENCOUNTER — Encounter: Payer: BC Managed Care – PPO | Admitting: Physical Therapy

## 2019-11-09 ENCOUNTER — Encounter: Payer: BC Managed Care – PPO | Admitting: Physical Therapy

## 2019-11-14 ENCOUNTER — Encounter: Payer: BC Managed Care – PPO | Admitting: Physical Therapy

## 2019-11-16 ENCOUNTER — Encounter: Payer: BC Managed Care – PPO | Admitting: Physical Therapy

## 2019-11-18 ENCOUNTER — Ambulatory Visit: Payer: BC Managed Care – PPO | Attending: Internal Medicine

## 2019-11-18 DIAGNOSIS — Z23 Encounter for immunization: Secondary | ICD-10-CM

## 2019-11-18 NOTE — Progress Notes (Signed)
   Covid-19 Vaccination Clinic  Name:  WANETTA KIRKEY    MRN: [id number] DOB: Dec 23, 1960  11/18/2019  Ms. Robin Searing was observed post Covid-19 immunization for 15 minutes without incident. She was provided with Vaccine Information Sheet and instruction to access the V-Safe system.   Ms. Robin Searing was instructed to call 911 with any severe reactions post vaccine: Marland Kitchen Difficulty breathing  . Swelling of face and throat  . A fast heartbeat  . A bad rash all over body  . Dizziness and weakness

## 2019-11-20 ENCOUNTER — Other Ambulatory Visit: Payer: Self-pay | Admitting: Pain Medicine

## 2019-11-20 DIAGNOSIS — E559 Vitamin D deficiency, unspecified: Secondary | ICD-10-CM

## 2019-11-21 ENCOUNTER — Encounter: Payer: BC Managed Care – PPO | Admitting: Physical Therapy

## 2019-11-23 ENCOUNTER — Encounter: Payer: BC Managed Care – PPO | Admitting: Physical Therapy

## 2019-12-12 ENCOUNTER — Telehealth: Payer: Self-pay | Admitting: *Deleted

## 2019-12-12 ENCOUNTER — Telehealth: Payer: Self-pay

## 2019-12-12 NOTE — Telephone Encounter (Signed)
Called patient and left message. Dr Dossie Arbour will not do refills over the phone and will need an earlier date. Patient to call and reschedule.

## 2019-12-15 ENCOUNTER — Telehealth: Payer: Self-pay | Admitting: Pain Medicine

## 2019-12-15 ENCOUNTER — Other Ambulatory Visit: Payer: Self-pay | Admitting: Student in an Organized Health Care Education/Training Program

## 2019-12-15 DIAGNOSIS — G608 Other hereditary and idiopathic neuropathies: Secondary | ICD-10-CM

## 2019-12-15 DIAGNOSIS — M792 Neuralgia and neuritis, unspecified: Secondary | ICD-10-CM

## 2019-12-15 MED ORDER — PREGABALIN 50 MG PO CAPS: 50.0000 mg | ORAL_CAPSULE | Freq: Three times a day (TID) | ORAL | 0 refills | Status: DC

## 2019-12-15 NOTE — Telephone Encounter (Signed)
Message sent to Dr Lateef 

## 2019-12-15 NOTE — Telephone Encounter (Signed)
Patient is calling to see if another phys can send in her Lyrica 50mg . She is out since yesterday and see Dr. Dossie Arbour until next week. Please ask if Dr. Holley Raring can write one script. Please let patient know if he will do this.

## 2019-12-15 NOTE — Telephone Encounter (Signed)
Dr. Holley Raring has agreed to send a script for Lyrica. Patient notified per voicemail.

## 2019-12-16 ENCOUNTER — Ambulatory Visit: Payer: BC Managed Care – PPO | Attending: Internal Medicine

## 2019-12-16 DIAGNOSIS — Z23 Encounter for immunization: Secondary | ICD-10-CM

## 2019-12-16 NOTE — Progress Notes (Signed)
   Covid-19 Vaccination Clinic  Name:  Grace Hester    MRN: [id number] DOB: Sep 01, 1960  12/16/2019  Ms. Grace Hester was observed post Covid-19 immunization for 15 minutes without incident. She was provided with Vaccine Information Sheet and instruction to access the V-Safe system.   Ms. Grace Hester was instructed to call 911 with any severe reactions post vaccine: Marland Kitchen Difficulty breathing  . Swelling of face and throat  . A fast heartbeat  . A bad rash all over body  . Dizziness and weakness   Immunizations Administered    Name Date Dose VIS Date Route   Moderna COVID-19 Vaccine 12/16/2019 11:58 AM 0.5 mL 07/2019 Intramuscular   Manufacturer: Moderna   Lot: [id number]   [id number]

## 2019-12-21 NOTE — Progress Notes (Signed)
Patient: Grace Hester  Service Category: E/M  Provider: Gaspar Cola, MD  DOB: [birth year]  DOS: 12/22/2019  Location: Office  MRN: [id number]  Setting: Ambulatory outpatient  Referring Provider: Lynnell Jude, MD  Type: Established Patient  Specialty: Interventional Pain Management  PCP: Patient, No Pcp Per  Location: Remote location  Delivery: TeleHealth     Virtual Encounter - Pain Management PROVIDER NOTE: Information contained herein reflects review and annotations entered in association with encounter. Interpretation of such information and data should be left to medically-trained personnel. Information provided to patient can be located elsewhere in the medical record under "Patient Instructions". Document created using STT-dictation technology, any transcriptional errors that may result from process are unintentional.    Contact & Pharmacy Preferred: [place]: [phone] (home) Mobile: [phone] (mobile) E-mail: [email or web address]  [id number] [place] [address]Phone: [phone]: [phone]  Pre-screening  Ms. FRobin Searingoffered "in-person" vs "virtual" encounter. She indicated preferring virtual for this encounter.   Reason COVID-19*  Social distancing based on CDC and AMA recommendations.   I contacted TArtis Flockon 12/22/2019 via telephone.      I clearly identified myself as FGaspar Cola MD. I verified that I was speaking with the correct person using two identifiers (Name: TRANEEN JAFFER and date of birth: [birth year].  Consent I sought verbal advanced consent from TArtis Flockfor virtual visit interactions. I informed [name] possible security and privacy concerns, risks, and limitations associated with providing "not-in-person" medical evaluation and management services. I also informed [name] the availability of "in-person"  appointments. Finally, I informed her that there would be a charge for the virtual visit and that she could be  personally, fully or partially, financially responsible for it. Ms. FRobin Searingexpressed understanding and agreed to proceed.   Historic Elements   Ms. TKAYLENA PACIFICOis a 59y.o. year old, female patient evaluated today after her last contact with our practice on 12/15/2019. [name] has a past medical history of Abdominal pain, Acid reflux (01/04/2014), Acute respiratory failure with hypoxemia ([place] (09/20/2014), Anemia, Anxiety, Arthritis, Asthma, Benign neoplasm of sigmoid colon, Billowing mitral valve, Calculus of kidney (62/87/8676, Complication of anesthesia, COPD (chronic obstructive pulmonary disease) ([place], Depression, Diabetes mellitus without complication ([place], Diverticulitis, Essential (primary) hypertension (01/10/2015), GERD (gastroesophageal reflux disease), Hypertension, Incomplete emptying of bladder (07/05/2014), Kidney stones, Neuropathy, PVC (premature ventricular contraction), Sciatica of left side, Septic shock ([place] (2015), Sigmoid diverticulitis (01/27/2017), Sleep apnea, Tachycardia (04/17/2014), and Thrombocytopenia ([place] (09/20/2014). She also  has a past surgical history that includes Augmentation mammaplasty (2008); Rotator cuff repair (Left); Flexor tendon repair (Left, 08/04/13); Kidney stone surgery (2015); Colonoscopy with propofol (N/A, 03/14/2016); Esophagogastroduodenoscopy (egd) with propofol (N/A, 03/14/2016); polypectomy (03/14/2016); Colonoscopy with propofol (N/A, 07/27/2017); Polypectomy (07/27/2017); Image guided sinus surgery; Ganglion cyst excision (Left); Laparoscopic sigmoid colectomy (N/A, 09/21/2017); Abdominal hysterectomy; and Colectomy. Ms. FRobin Searinghas a current medication list which includes the following prescription(s): albuterol, buspirone, calcium carbonate, vitamin d3, esomeprazole, eucrisa, fexofenadine, hydrochlorothiazide, magnesium, metoprolol  succinate, oxybutynin, [START ON 12/30/2019] pregabalin, [START ON 12/30/2019] tramadol, albuterol, amitriptyline, buspirone, chlorpheniramine-hydrocodone, clobetasol cream, [facility] d3, doxycycline, duloxetine, hydrochlorothiazide, metformin, metoprolol succinate, pregabalin, tramadol, and vitamin d (ergocalciferol). She  reports that she quit smoking about 19 years ago. She has never used smokeless tobacco. She reports previous alcohol use. She reports previous drug use. Ms. FRobin Searingis allergic to latex; tape; and  latex.   HPI  Today, she is being contacted for medication management.  The patient indicates doing well with the current medication regimen. No adverse reactions or side effects reported to the medications.   Pharmacotherapy Assessment  Analgesic: Tramadol 50 mg 2 tablets p.o. at bedtime (100 mg/day of tramadol) Highest recorded MME/day: 10 mg/day MME/day: 10 mg/day   Monitoring: [place] PMP: PDMP reviewed during this encounter.       Pharmacotherapy: No side-effects or adverse reactions reported. Compliance: No problems identified. Effectiveness: Clinically acceptable. Plan: Refer to "POC".  UDS: No results found for: SUMMARY Laboratory Chemistry Profile   Renal Lab Results  Component Value Date   BUN 10 10/11/2019   CREATININE 0.64 10/11/2019   GFRAA >60 10/11/2019   GFRNONAA >60 10/11/2019     Hepatic Lab Results  Component Value Date   AST 33 10/11/2019   ALT 41 10/11/2019   ALBUMIN 4.2 10/11/2019   ALKPHOS 93 10/11/2019   LIPASE 42 06/10/2017     Electrolytes Lab Results  Component Value Date   NA 139 10/11/2019   K 3.6 10/11/2019   CL 98 10/11/2019   CALCIUM 9.7 10/11/2019   MG 1.8 10/11/2019   PHOS 2.4 (L) 09/23/2017     Bone Lab Results  Component Value Date   VD25OH 21.99 (L) 10/11/2019     Inflammation (CRP: Acute Phase) (ESR: Chronic Phase) Lab Results  Component Value Date   CRP 2.8 (H) 10/11/2019   ESRSEDRATE 22 10/11/2019   LATICACIDVEN 2.5  ([place]) 06/10/2017       Note: Above Lab results reviewed.  Imaging  DG Chest 2 View CLINICAL DATA:  Cough and shortness-of-breath with low-grade fever 6 days. COVID-19 pending.  EXAM: CHEST - 2 VIEW  COMPARISON:  09/22/2017  FINDINGS: Lungs are adequately inflated without focal airspace consolidation or effusion. Minimal linear scarring over the right midlung. Cardiomediastinal silhouette and remainder of the exam is unchanged.  IMPRESSION: No active cardiopulmonary disease.  Electronically Signed   By: Marin Olp M.D.   On: 12/22/2019 11:29  Assessment  The primary encounter diagnosis was Chronic pain syndrome. Diagnoses of Chronic lower extremity pain (Bilateral), Chronic hip pain (Bilateral) (L>R), Chronic knee pain (Bilateral) (R>L), Pharmacologic therapy, History of lumbar surgery, Polyneuropathy, peripheral sensorimotor axonal, and Neurogenic pain were also pertinent to this visit.  Plan of Care  Problem-specific:  No problem-specific Assessment & Plan notes found for this encounter.  Ms. GERALD HONEA has a current medication list which includes the following long-term medication(s): albuterol, calcium carbonate, esomeprazole, fexofenadine, hydrochlorothiazide, metoprolol succinate, [START ON 12/30/2019] pregabalin, [START ON 12/30/2019] tramadol, albuterol, amitriptyline, duloxetine, hydrochlorothiazide, metformin, metoprolol succinate, pregabalin, and tramadol.  Pharmacotherapy (Medications Ordered): Meds ordered this encounter  Medications  . traMADol (ULTRAM) 50 MG tablet    Sig: Take 1-2 tablets (50-100 mg total) by mouth at bedtime. Must last 30 days    Dispense:  60 tablet    Refill:  2    Chronic Pain: STOP Act (Not applicable) Fill 1 day early if closed on refill date. Do not fill until: 12/30/2019. To last until: 03/29/2020. Avoid benzodiazepines within 8 hours of opioids  . pregabalin (LYRICA) 50 MG capsule    Sig: Take 1 capsule (50 mg total) by mouth 3  (three) times daily.    Dispense:  90 capsule    Refill:  2    Fill one day early if pharmacy is closed on scheduled refill date. May substitute for generic if available.  Orders:  Orders Placed This Encounter  Procedures  . ToxASSURE Select 13 (MW), Urine    Volume: 30 ml(s). Minimum 3 ml of urine is needed. Document temperature of fresh sample. Indications: Long term (current) use of opiate analgesic (F12.197)    Order Specific Question:   Release to patient    Answer:   Immediate   Follow-up plan:   Return in about 3 months (around 03/28/2020) for (F2F), (MM).      Interventional management options: Planned, scheduled, and/or pending:    Taper down gabapentin.  Titrate Lyrica for a trial.  Tramadol at bedtime trial.  If this fails, then we will begin interventional therapies.   Under consideration:   Diagnostic caudal ESI #1 + diagnostic epidurogram #1  Diagnostic bilateral LSB #1  Possible spinal cord stimulator trial    PRN Procedures:   None at this time     Recent Visits Date Type Provider Dept  10/31/19 Telemedicine Milinda Pointer, [facility]  10/03/19 Telemedicine Milinda Pointer, MD [facility]  Showing recent visits within past 90 days and meeting all other requirements   Today's Visits Date Type Provider Dept  12/22/19 Telemedicine Milinda Pointer, MD [facility]  Showing today's visits and meeting all other requirements   Future Appointments No visits were found meeting these conditions.  Showing future appointments within next 90 days and meeting all other requirements   I discussed the assessment and treatment plan with the patient. The patient was provided an opportunity to ask questions and all were answered. The patient agreed with the plan and demonstrated an understanding of the instructions.  Patient advised to call back or seek an in-person evaluation if the symptoms or condition worsens.  Duration of  encounter: 12 minutes.  Note by: Gaspar Cola, MD Date: 12/22/2019; Time: 3:46 PM

## 2019-12-21 NOTE — Progress Notes (Signed)
Patient is awaiing COVID results, was exposed at work and having symptoms.

## 2019-12-22 ENCOUNTER — Ambulatory Visit
Admission: EM | Admit: 2019-12-22 | Discharge: 2019-12-22 | Disposition: A | Discharge status: Home / Self Care | Payer: BC Managed Care – PPO | Attending: Family Medicine | Admitting: Family Medicine

## 2019-12-22 ENCOUNTER — Ambulatory Visit (INDEPENDENT_AMBULATORY_CARE_PROVIDER_SITE_OTHER): Payer: BC Managed Care – PPO

## 2019-12-22 ENCOUNTER — Other Ambulatory Visit: Payer: Self-pay

## 2019-12-22 ENCOUNTER — Encounter: Payer: Self-pay | Admitting: Pain Medicine

## 2019-12-22 ENCOUNTER — Ambulatory Visit (HOSPITAL_BASED_OUTPATIENT_CLINIC_OR_DEPARTMENT_OTHER): Payer: BC Managed Care – PPO | Admitting: Pain Medicine

## 2019-12-22 DIAGNOSIS — J441 Chronic obstructive pulmonary disease with (acute) exacerbation: Secondary | ICD-10-CM | POA: Insufficient documentation

## 2019-12-22 DIAGNOSIS — M792 Neuralgia and neuritis, unspecified: Secondary | ICD-10-CM

## 2019-12-22 DIAGNOSIS — M79605 Pain in left leg: Secondary | ICD-10-CM

## 2019-12-22 DIAGNOSIS — Z9889 Other specified postprocedural states: Secondary | ICD-10-CM

## 2019-12-22 DIAGNOSIS — M25551 Pain in right hip: Secondary | ICD-10-CM

## 2019-12-22 DIAGNOSIS — G608 Other hereditary and idiopathic neuropathies: Secondary | ICD-10-CM

## 2019-12-22 DIAGNOSIS — M25562 Pain in left knee: Secondary | ICD-10-CM

## 2019-12-22 DIAGNOSIS — R05 Cough: Secondary | ICD-10-CM | POA: Diagnosis present

## 2019-12-22 DIAGNOSIS — Z79899 Other long term (current) drug therapy: Secondary | ICD-10-CM

## 2019-12-22 DIAGNOSIS — Z20822 Contact with and (suspected) exposure to covid-19: Secondary | ICD-10-CM | POA: Diagnosis present

## 2019-12-22 DIAGNOSIS — M79604 Pain in right leg: Secondary | ICD-10-CM

## 2019-12-22 DIAGNOSIS — G8929 Other chronic pain: Secondary | ICD-10-CM

## 2019-12-22 DIAGNOSIS — R058 Other specified cough: Secondary | ICD-10-CM

## 2019-12-22 DIAGNOSIS — M25561 Pain in right knee: Secondary | ICD-10-CM

## 2019-12-22 DIAGNOSIS — M25552 Pain in left hip: Secondary | ICD-10-CM

## 2019-12-22 DIAGNOSIS — G894 Chronic pain syndrome: Secondary | ICD-10-CM | POA: Diagnosis not present

## 2019-12-22 HISTORY — DX: Diverticulitis of intestine, part unspecified, without perforation or abscess without bleeding: K57.92

## 2019-12-22 HISTORY — DX: Polyneuropathy, unspecified: G62.9

## 2019-12-22 HISTORY — DX: Chronic obstructive pulmonary disease, unspecified: J44.9

## 2019-12-22 HISTORY — DX: Unspecified osteoarthritis, unspecified site: M19.90

## 2019-12-22 HISTORY — DX: Type 2 diabetes mellitus without complications: E11.9

## 2019-12-22 LAB — SARS CORONAVIRUS 2 (TAT 6-24 HRS): SARS Coronavirus 2: NEGATIVE

## 2019-12-22 MED ORDER — HYDROCOD POLST-CPM POLST ER 10-8 MG/5ML PO SUER: 5.0000 mL | Freq: Two times a day (BID) | ORAL | 0 refills | Status: DC | PRN

## 2019-12-22 MED ORDER — PREGABALIN 50 MG PO CAPS: 50.0000 mg | ORAL_CAPSULE | Freq: Three times a day (TID) | ORAL | 2 refills | Status: AC

## 2019-12-22 MED ORDER — DOXYCYCLINE HYCLATE 100 MG PO CAPS
100.0000 mg | ORAL_CAPSULE | Freq: Two times a day (BID) | ORAL | 0 refills | Status: AC
Start: 2019-12-22 — End: 2019-12-29

## 2019-12-22 MED ORDER — TRAMADOL HCL 50 MG PO TABS: 50.0000 mg | ORAL_TABLET | Freq: Every day | ORAL | 2 refills | Status: AC

## 2019-12-22 NOTE — Patient Instructions (Signed)
____________________________________________________________________________________________  Medication Rules  Purpose: To inform patients, and their family members, of our rules and regulations.  Applies to: All patients receiving prescriptions (written or electronic).  Pharmacy of record: Pharmacy where electronic prescriptions will be sent. If written prescriptions are taken to a different pharmacy, please inform the nursing staff. The pharmacy listed in the electronic medical record should be the one where you would like electronic prescriptions to be sent.  Electronic prescriptions: In compliance with the [place] Strengthen Opioid Misuse Prevention (STOP) Act of 2017 (Session Law 2017-74/H243), effective August 25, 2018, all controlled substances must be electronically prescribed. Calling prescriptions to the pharmacy will cease to exist.  Prescription refills: Only during scheduled appointments. Applies to all prescriptions.  NOTE: The following applies primarily to controlled substances (Opioid* Pain Medications).   Patient's responsibilities: 1. Pain Pills: Bring all pain pills to every appointment (except for procedure appointments). 2. Pill Bottles: Bring pills in original pharmacy bottle. Always bring the newest bottle. Bring bottle, even if empty. 3. Medication refills: You are responsible for knowing and keeping track of what medications you take and those you need refilled. The day before your appointment: write a list of all prescriptions that need to be refilled. The day of the appointment: give the list to the admitting nurse. Prescriptions will be written only during appointments. No prescriptions will be written on procedure days. If you forget a medication: it will not be "Called in", "Faxed", or "electronically sent". You will need to get another appointment to get these prescribed. No early refills. Do not call asking to have your prescription filled  early. 4. Prescription Accuracy: You are responsible for carefully inspecting your prescriptions before leaving our office. Have the discharge nurse carefully go over each prescription with you, before taking them home. Make sure that your name is accurately spelled, that your address is correct. Check the name and dose of your medication to make sure it is accurate. Check the number of pills, and the written instructions to make sure they are clear and accurate. Make sure that you are given enough medication to last until your next medication refill appointment. 5. Taking Medication: Take medication as prescribed. When it comes to controlled substances, taking less pills or less frequently than prescribed is permitted and encouraged. Never take more pills than instructed. Never take medication more frequently than prescribed.  6. Inform other Doctors: Always inform, all of your healthcare providers, of all the medications you take. 7. Pain Medication from other Providers: You are not allowed to accept any additional pain medication from any other Doctor or Healthcare provider. There are two exceptions to this rule. (see below) In the event that you require additional pain medication, you are responsible for notifying us, as stated below. 8. Medication Agreement: You are responsible for carefully reading and following our Medication Agreement. This must be signed before receiving any prescriptions from our practice. Safely store a copy of your signed Agreement. Violations to the Agreement will result in no further prescriptions. (Additional copies of our Medication Agreement are available upon request.) 9. Laws, Rules, & Regulations: All patients are expected to follow all Federal and State Laws, Statutes, Rules, & Regulations. Ignorance of the Laws does not constitute a valid excuse.  10. Illegal drugs and Controlled Substances: The use of illegal substances (including, but not limited to marijuana and its  derivatives) and/or the illegal use of any controlled substances is strictly prohibited. Violation of this rule may result in the immediate and   permanent discontinuation of any and all prescriptions being written by our practice. The use of any illegal substances is prohibited. 11. Adopted CDC guidelines & recommendations: Target dosing levels will be at or below 60 MME/day. Use of benzodiazepines** is not recommended.  Exceptions: There are only two exceptions to the rule of not receiving pain medications from other Healthcare Providers. 1. Exception #1 (Emergencies): In the event of an emergency (i.e.: accident requiring emergency care), you are allowed to receive additional pain medication. However, you are responsible for: As soon as you are able, call our office [phone], at any time of the day or night, and leave a message stating your name, the date and nature of the emergency, and the name and dose of the medication prescribed. In the event that your call is answered by a member of our staff, make sure to document and save the date, time, and the name of the person that took your information.  2. Exception #2 (Planned Surgery): In the event that you are scheduled by another doctor or dentist to have any type of surgery or procedure, you are allowed (for a period no longer than 30 days), to receive additional pain medication, for the acute post-op pain. However, in this case, you are responsible for picking up a copy of our "Post-op Pain Management for Surgeons" handout, and giving it to your surgeon or dentist. This document is available at our office, and does not require an appointment to obtain it. Simply go to our office during business hours (Monday-Thursday from 8:00 AM to 4:00 PM) (Friday 8:00 AM to 12:00 Noon) or if you have a scheduled appointment with [place], prior to your surgery, and ask for it by name. In addition, you will need to provide [place] with your name, name of your surgeon, type of  surgery, and date of procedure or surgery.  *Opioid medications include: morphine, codeine, oxycodone, oxymorphone, hydrocodone, hydromorphone, meperidine, tramadol, tapentadol, buprenorphine, fentanyl, methadone. **Benzodiazepine medications include: diazepam (Valium), alprazolam (Xanax), clonazepam (Klonopine), lorazepam (Ativan), clorazepate (Tranxene), chlordiazepoxide (Librium), estazolam (Prosom), oxazepam (Serax), temazepam (Restoril), triazolam (Halcion) (Last updated: 10/22/2017) ____________________________________________________________________________________________   ____________________________________________________________________________________________  Medication Recommendations and Reminders  Applies to: All patients receiving prescriptions (written and/or electronic).  Medication Rules & Regulations: These rules and regulations exist for your safety and that of others. They are not flexible and neither are we. Dismissing or ignoring them will be considered "non-compliance" with medication therapy, resulting in complete and irreversible termination of such therapy. (See document titled "Medication Rules" for more details.) In all conscience, because of safety reasons, we cannot continue providing a therapy where the patient does not follow instructions.  Pharmacy of record:   Definition: This is the pharmacy where your electronic prescriptions will be sent.   We do not endorse any particular pharmacy.  You are not restricted in your choice of pharmacy.  The pharmacy listed in the electronic medical record should be the one where you want electronic prescriptions to be sent.  If you choose to change pharmacy, simply notify our nursing staff of your choice of new pharmacy.  Recommendations:  Keep all of your pain medications in a safe place, under lock and key, even if you live alone.   After you fill your prescription, take 1 week's worth of pills and put them  away in a safe place. You should keep a separate, properly labeled bottle for this purpose. The remainder should be kept in the original bottle. Use this as your primary supply,  until it runs out. Once it's gone, then you know that you have 1 week's worth of medicine, and it is time to come in for a prescription refill. If you do this correctly, it is unlikely that you will ever run out of medicine.  To make sure that the above recommendation works, it is very important that you make sure your medication refill appointments are scheduled at least 1 week before you run out of medicine. To do this in an effective manner, make sure that you do not leave the office without scheduling your next medication management appointment. Always ask the nursing staff to show you in your prescription , when your medication will be running out. Then arrange for the receptionist to get you a return appointment, at least 7 days before you run out of medicine. Do not wait until you have 1 or 2 pills left, to come in. This is very poor planning and does not take into consideration that we may need to cancel appointments due to bad weather, sickness, or emergencies affecting our staff.  "Partial Fill": If for any reason your pharmacy does not have enough pills/tablets to completely fill or refill your prescription, do not allow for a "partial fill". You will need a separate prescription to fill the remaining amount, which we will not provide. If the reason for the partial fill is your insurance, you will need to talk to the pharmacist about payment alternatives for the remaining tablets, but again, do not accept a partial fill.  Prescription refills and/or changes in medication(s):   Prescription refills, and/or changes in dose or medication, will be conducted only during scheduled medication management appointments. (Applies to both, written and electronic prescriptions.)  No refills on procedure days. No medication will be  changed or started on procedure days. No changes, adjustments, and/or refills will be conducted on a procedure day. Doing so will interfere with the diagnostic portion of the procedure.  No phone refills. No medications will be "called into the pharmacy".  No Fax refills.  No weekend refills.  No Holliday refills.  No after hours refills.  Remember:  Business hours are:  Monday to Thursday 8:00 AM to 4:00 PM Provider's Schedule: Milinda Pointer, MD - Appointments are:  Medication management: Monday and Wednesday 8:00 AM to 4:00 PM Procedure day: Tuesday and Thursday 7:30 AM to 4:00 PM Gillis Santa, MD - Appointments are:  Medication management: Tuesday and Thursday 8:00 AM to 4:00 PM Procedure day: Monday and Wednesday 7:30 AM to 4:00 PM (Last update: 10/22/2017) ____________________________________________________________________________________________   ____________________________________________________________________________________________  CANNABIDIOL (AKA: CBD Oil or Pills)  Applies to: All patients receiving prescriptions of controlled substances (written and/or electronic).  General Information: Cannabidiol (CBD) was discovered in 68. It is one of some 113 identified cannabinoids in cannabis (Marijuana) plants, accounting for up to 40% of the plant's extract. As of 2018, preliminary clinical research on cannabidiol included studies of anxiety, cognition, movement disorders, and pain.  Cannabidiol is consummed in multiple ways, including inhalation of cannabis smoke or vapor, as an aerosol spray into the cheek, and by mouth. It may be supplied as CBD oil containing CBD as the active ingredient (no added tetrahydrocannabinol (THC) or terpenes), a full-plant CBD-dominant hemp extract oil, capsules, dried cannabis, or as a liquid solution. CBD is thought not have the same psychoactivity as THC, and may affect the actions of THC. Studies suggest that CBD may interact with  different biological targets, including cannabinoid receptors and other neurotransmitter receptors. As  of 2018 the mechanism of action for its biological effects has not been determined.  In the [place], cannabidiol has a limited approval by the Food and Drug Administration (FDA) for treatment of only two types of epilepsy disorders. The side effects of long-term use of the drug include somnolence, decreased appetite, diarrhea, fatigue, malaise, weakness, sleeping problems, and others.  CBD remains a Schedule I drug prohibited for any use.  Legality: Some manufacturers ship CBD products nationally, an illegal action which the FDA has not enforced in 2018, with CBD remaining the subject of an FDA investigational new drug evaluation, and is not considered legal as a dietary supplement or food ingredient as of December 2018. Federal illegality has made it difficult historically to conduct research on CBD. CBD is openly sold in head shops and health food stores in some states where such sales have not been explicitly legalized.  Warning: Because it is not FDA approved for general use or treatment of pain, it is not required to undergo the same manufacturing controls as prescription drugs.  This means that the available cannabidiol (CBD) may be contaminated with THC.  If this is the case, it will trigger a positive urine drug screen (UDS) test for cannabinoids (Marijuana).  Because a positive UDS for illicit substances is a violation of our medication agreement, your opioid analgesics (pain medicine) may be permanently discontinued. (Last update: 11/12/2017) ____________________________________________________________________________________________

## 2019-12-22 NOTE — ED Triage Notes (Signed)
Pt reports low grade fever and not feeling well x 10 days. Cough, wheezing and "I hurt from coughing"

## 2019-12-22 NOTE — ED Provider Notes (Signed)
[place], [place]   Name: Grace Hester DOB: 05/06/1961 MRN: [id number] CSN: [id number] PCP: Patient, No Pcp Per  Arrival date and time:  12/22/19 1027  Chief Complaint:  Cough and Wheezing  NOTE: Prior to seeing the patient today, I have reviewed the triage nursing documentation and vital signs. Clinical staff has updated patient's PMH/PSHx, current medication list, and drug allergies/intolerances to ensure comprehensive history available to assist in medical decision making.   History:   HPI: Grace Hester is a 59 y.o. female who presents today with complaints of cough, diffuse myalgias, and sore throat that began approximately 6 days ago (12/17/2019); symptoms worsened on Monday. Patient has been running low grade fevers to a Tmax of 99.2. She notes that she feels more short of breath than normal; PMH (+) for COPD. She is audibly wheezing. Cough has been productive of yellow sputum. Coughing has resulting in patient developing upper back pain and pleuritic chest pain associated with deep inspiration. Patient denies any nausea or vomiting, however had some diarrhea over the weekend. Diarrhea has resolved at this point. Appetite is decreased overall, however patient is making efforts to remain well hydrated. Patient presents out of concerns for her personal health after being exposed to a co-worker who tested (+) for SARS-CoV-2 (novel coronavirus) about 1.5 weeks ago. She has been tested for SARS-CoV-2 (novel coronavirus) in the past 14 days; last tested negative on 12/19/2019 per her report. Patient has spoken with her PCP and was advised to use OTC Coricidin and her albuterol MDI. Patient also has been using a sinus cleansing system (Neti pot). She denies improvement with the aforementioned interventions.   Past Medical History:  Diagnosis Date  . Arthritis   . COPD (chronic obstructive pulmonary disease) ([place])   . Diabetes mellitus without complication ([place])   . Diverticulitis   .  Hypertension   . Kidney stones   . Neuropathy     Past Surgical History:  Procedure Laterality Date  . ABDOMINAL HYSTERECTOMY    . COLECTOMY      History reviewed. No pertinent family history.  Social History   Tobacco Use  . Smoking status: Former Research scientist (life sciences)  . Smokeless tobacco: Never Used  Substance Use Topics  . Alcohol use: Not Currently  . Drug use: Not Currently    There are no problems to display for this patient.   Home Medications:    No outpatient medications have been marked as taking for the 12/22/19 encounter [facility] Encounter).    Allergies:   Latex  Review of Systems (ROS):  Review of systems NEGATIVE unless otherwise noted in narrative H&P section.   Vital Signs: Today's Vitals   12/22/19 1044 12/22/19 1045  BP:  (!) 151/94  Pulse:  100  Resp:  19  Temp:  98.5 F (36.9 C)  TempSrc:  Oral  SpO2:  97%  Weight: 226 lb (102.5 kg)   Height: 5\' 11"  (1.803 m)   PainSc: 7      Physical Exam: Physical Exam  Constitutional: She is oriented to person, place, and time and well-developed, well-nourished, and in no distress.  Acutely ill appearing; fatigued.  HENT:  Head: Normocephalic and atraumatic.  Right Ear: Tympanic membrane normal.  Left Ear: Tympanic membrane normal.  Nose: Mucosal edema and rhinorrhea present. No sinus tenderness.  Mouth/Throat: Uvula is midline. Posterior oropharyngeal erythema present. No oropharyngeal exudate or posterior oropharyngeal edema.  Eyes: Pupils are equal, round, and reactive to light.  Cardiovascular: Normal rate, regular rhythm, normal heart  sounds and intact distal pulses.  Pulmonary/Chest: Effort normal. She has wheezes (scattered expiratory). She has rhonchi in the right upper field and the left upper field.  Moderate cough noted in clinic. No SOB or increased WOB. No distress. Able to speak in complete sentences without difficulties. SPO2 97% on RA.  Abdominal: Soft. Normal appearance and bowel sounds are  normal. She exhibits no distension. There is no abdominal tenderness.  Neurological: She is alert and oriented to person, place, and time. Gait normal.  Skin: Skin is warm and dry. No rash noted. She is not diaphoretic.  Psychiatric: Mood, memory, affect and judgment normal.  Nursing note and vitals reviewed.   Urgent Care Treatments / Results:   Orders Placed This Encounter  Procedures  . SARS CORONAVIRUS 2 (TAT 6-24 HRS) Nasopharyngeal Nasopharyngeal Swab  . DG Chest 2 View    LABS: PLEASE NOTE: all labs that were ordered this encounter are listed, however only abnormal results are displayed. Labs Reviewed  SARS CORONAVIRUS 2 (TAT 6-24 HRS)    EKG: -None  RADIOLOGY: DG Chest 2 View  Result Date: 12/22/2019 CLINICAL DATA:  Cough and shortness-of-breath with low-grade fever 6 days. COVID-19 pending. EXAM: CHEST - 2 VIEW COMPARISON:  09/22/2017 FINDINGS: Lungs are adequately inflated without focal airspace consolidation or effusion. Minimal linear scarring over the right midlung. Cardiomediastinal silhouette and remainder of the exam is unchanged. IMPRESSION: No active cardiopulmonary disease. Electronically Signed   By: Marin Olp M.D.   On: 12/22/2019 11:29    PROCEDURES: Procedures  MEDICATIONS RECEIVED THIS VISIT: Medications - No data to display  PERTINENT CLINICAL COURSE NOTES/UPDATES:   Initial Impression / Assessment and Plan / Urgent Care Course:  Pertinent labs & imaging results that were available during my care of the patient were personally reviewed by me and considered in my medical decision making (see lab/imaging section of note for values and interpretations).  Grace Hester is a 59 y.o. female who [facility] to [facility] today with complaints of Cough and Wheezing  Acutely ill appearing; fatigued/listless.  She does not appear to be in any acute distress. Presenting symptoms (see HPI) and exam as documented above. Symptoms consistent with an  exacerbation of patient's known COPD. She was SARS-CoV-2 (-) earlier in the week. Retesting today as patient feels like symptoms worse. Treating with a 7 day course of oral doxycycline and Tussionex. Patient to continue SABA (albuterol) MDI. Recommended supportive care (rest, hydration, and antipyretics PRN).   Current clinical condition warrants patient being out of work in order to recover from her current injury/illness. She was provided with the appropriate documentation to provide to her place of employment that will allow for her to RTW on 12/26/2019 with no restrictions.   Discussed follow up with primary care physician in 1 week for re-evaluation. I have reviewed the follow up and strict return precautions for any new or worsening symptoms. Patient is aware of symptoms that would be deemed urgent/emergent, and would thus require further evaluation either here or in the emergency department. At the time of discharge, she verbalized understanding and consent with the discharge plan as it was reviewed with her. All questions were fielded by provider and/or clinic staff prior to patient discharge.    Final Clinical Impressions / Urgent Care Diagnoses:   Final diagnoses:  COPD exacerbation ([place])  Cough with exposure to COVID-19 virus  Encounter for laboratory testing for COVID-19 virus    New Prescriptions:  [place] Controlled Substance Registry consulted? Yes,  I have consulted the [place] Controlled Substances Registry for this patient, and feel the risk/benefit ratio today is favorable for proceeding with this prescription for a controlled substance.  . Discussed use of controlled substance medication to treat her acute symptoms.  o Reviewed  STOP Act regulations  o Clinic does not refill controlled substances over the phone without face to face evaluation.  . Safety precautions reviewed.  o Medications should not be sold or taken with alcohol.  o Avoid use while working, driving, or operating  heavy machinery.  o Side effects associated with the use of this particular medication reviewed. - Patient understands that this medication can cause CNS depression, increase her risk of falls, and even lead to overdose that may result in death, if used outside of the parameters that she and I discussed.  With all of this in mind, she knowingly accepts the risks and responsibilities associated with intended course of treatment, and elects to responsibly proceed as discussed.  Meds ordered this encounter  Medications  . doxycycline (VIBRAMYCIN) 100 MG capsule    Sig: Take 1 capsule (100 mg total) by mouth 2 (two) times daily for 7 days.    Dispense:  14 capsule    Refill:  0  . chlorpheniramine-HYDROcodone (TUSSIONEX PENNKINETIC ER) 10-8 MG/5ML SUER    Sig: Take 5 mLs by mouth every 12 (twelve) hours as needed for cough. Will causes drowsiness; NO DRIVING.    Dispense:  70 mL    Refill:  0    Recommended Follow up Care:  Patient encouraged to follow up with the following provider within the specified time frame, or sooner as dictated by the severity of her symptoms. As always, she was instructed that for any urgent/emergent care needs, she should seek care either here or in the emergency department for more immediate evaluation.  Follow-up Information    PCP In 1 week.   Why: General reassessment of symptoms if not improving        NOTE: This note was prepared using Lobbyist along with smaller Company secretary. Despite my best ability to proofread, there is the potential that transcriptional errors may still occur from this process, and are completely unintentional.    Karen Kitchens, NP 12/22/19 1154

## 2019-12-22 NOTE — Discharge Instructions (Signed)
It was very nice seeing you today in clinic. Thank you for entrusting me with your care.   Rest and Stay HYDRATED. Water and electrolyte containing beverages (Gatorade, Pedialyte) are best to prevent dehydration and electrolyte abnormalities. Recommended warm salt water gargles, hard candies/lozenges, and hot tea with honey/lemon to help soothe the throat and reduce irritation. May use Tylenol and/or Ibuprofen as needed for pain/fever.   You were tested for SARS-CoV-2 (novel coronavirus) today. Testing is being processed at the main campus of [facility] in [place], and have been taking 12-24 hours to come back. Current recommendations from the the CDC and [place] [employer] require that you remain out of work in order to quarantine at home until negative test results are have been received. In the event that your test results are positive, you will be contacted with further directives. These measures are being implemented out of an abundance of caution to prevent transmission and spread during the current SARS-CoV-2 pandemic.   Make arrangements to follow up with your regular doctor in 1 week for re-evaluation if not improving. If your symptoms/condition worsens, please seek follow up care either here or in the ER. Please remember, our [place] providers are "right here with you" when you need [place].   Again, it was my pleasure to take care of you today. Thank you for choosing our clinic. I hope that you start to feel better quickly.   Honor Loh, MSN, APRN, FNP-C, CEN Advanced Practice Provider [facility]

## 2019-12-26 ENCOUNTER — Telehealth: Payer: BC Managed Care – PPO | Admitting: Pain Medicine

## 2019-12-29 LAB — TOXASSURE SELECT 13 (MW), URINE

## 2020-01-09 ENCOUNTER — Telehealth: Payer: Self-pay | Admitting: Pain Medicine

## 2020-01-09 NOTE — Telephone Encounter (Signed)
Patient states pharmacy says they do not have a script for her to fill on Lyrica. Please check and let patient know. She is out of meds.

## 2020-01-09 NOTE — Telephone Encounter (Signed)
I called [facility], there is a script available to be filled 01-14-20. I informed patient, she said they told her there is not scipt for Pregabalin available to be filled. I asked her to call the pharmacy again to verify.

## 2020-03-26 ENCOUNTER — Encounter: Payer: BC Managed Care – PPO | Admitting: Pain Medicine

## 2020-03-27 ENCOUNTER — Encounter: Payer: BC Managed Care – PPO | Admitting: Pain Medicine

## 2020-06-01 DIAGNOSIS — E1159 Type 2 diabetes mellitus with other circulatory complications: Secondary | ICD-10-CM | POA: Insufficient documentation

## 2020-06-15 ENCOUNTER — Encounter: Payer: Self-pay | Admitting: *Deleted

## 2020-06-15 ENCOUNTER — Encounter: Payer: BC Managed Care – PPO | Attending: Cardiovascular Disease | Admitting: *Deleted

## 2020-06-15 ENCOUNTER — Other Ambulatory Visit: Payer: Self-pay

## 2020-06-15 DIAGNOSIS — Z955 Presence of coronary angioplasty implant and graft: Secondary | ICD-10-CM

## 2020-06-15 NOTE — Progress Notes (Signed)
Initial telephone orientation completed. Diagnosis can be found in [facility] 10/8. EP orientation scheduled for Thursday 11/4 at 3:30

## 2020-06-28 ENCOUNTER — Other Ambulatory Visit: Payer: Self-pay

## 2020-06-28 ENCOUNTER — Encounter: Payer: BC Managed Care – PPO | Attending: Cardiovascular Disease

## 2020-06-28 VITALS — Ht 70.5 in | Wt 234.3 lb

## 2020-06-28 DIAGNOSIS — Z955 Presence of coronary angioplasty implant and graft: Secondary | ICD-10-CM | POA: Diagnosis present

## 2020-06-28 NOTE — Progress Notes (Signed)
Cardiac Individual Treatment Plan  Patient Details  Name: Grace Hester MRN: [id number] Date of Birth: 08/09/1961 Referring Provider:     Cardiac Rehab from 06/28/2020 in [facility] Cardiac and Pulmonary Rehab  Referring Provider Tamala Julian      Initial Encounter Date:    Cardiac Rehab from 06/28/2020 in [facility] Cardiac and Pulmonary Rehab  Date 06/28/20      Visit Diagnosis: Status post coronary artery stent placement  Patient's Home Medications on Admission:  Current Outpatient Medications:  .  albuterol (PROVENTIL HFA;VENTOLIN HFA) 108 (90 Base) MCG/ACT inhaler, Inhale 1-2 puffs into the lungs every 6 (six) hours as needed for wheezing or shortness of breath. (Patient not taking: Reported on 06/15/2020), Disp: 1 Inhaler, Rfl: 0 .  albuterol (VENTOLIN HFA) 108 (90 Base) MCG/ACT inhaler, , Disp: , Rfl:  .  amitriptyline (ELAVIL) 10 MG tablet, , Disp: , Rfl:  .  aspirin 81 MG chewable tablet, Chew by mouth., Disp: , Rfl:  .  atorvastatin (LIPITOR) 40 MG tablet, Take 40 mg by mouth at bedtime., Disp: , Rfl:  .  busPIRone (BUSPAR) 10 MG tablet, Take 10 mg by mouth 2 (two) times daily. 2 tabs twice per day, Disp: , Rfl:  .  busPIRone (BUSPAR) 10 MG tablet, Take 20 mg by mouth 2 (two) times daily., Disp: , Rfl:  .  calcium carbonate (CALCIUM 600) 600 MG TABS tablet, Take 2 tablets (1,200 mg total) by mouth daily with breakfast., Disp: 60 tablet, Rfl: 5 .  chlorpheniramine-HYDROcodone (TUSSIONEX PENNKINETIC ER) 10-8 MG/5ML SUER, Take 5 mLs by mouth every 12 (twelve) hours as needed for cough. Will causes drowsiness; NO DRIVING. (Patient not taking: Reported on 06/15/2020), Disp: 70 mL, Rfl: 0 .  clobetasol cream (TEMOVATE) 0.05 %, APPLY TO AFFECTED AREA TWICE A DAY (Patient not taking: Reported on 06/15/2020), Disp: , Rfl:  .  clopidogrel (PLAVIX) 75 MG tablet, Take 75 mg by mouth daily., Disp: , Rfl:  .  [employer] D3 125 MCG (5000 UT) capsule, Take 5,000 Units by mouth every morning. (Patient not taking:  Reported on 06/15/2020), Disp: , Rfl:  .  doxepin (SINEQUAN) 50 MG capsule, Take 50 mg by mouth at bedtime., Disp: , Rfl:  .  DULoxetine (CYMBALTA) 60 MG capsule, Take 120 mg by mouth daily. (Patient not taking: Reported on 06/15/2020), Disp: , Rfl:  .  esomeprazole (NEXIUM) 20 MG capsule, Take 20 mg by mouth every morning. , Disp: , Rfl:  .  EUCRISA 2 % OINT, Apply 1 application topically at bedtime.  (Patient not taking: Reported on 06/15/2020), Disp: , Rfl: 1 .  fexofenadine (ALLEGRA) 180 MG tablet, Take 180 mg by mouth daily. , Disp: , Rfl:  .  hydrochlorothiazide (HYDRODIURIL) 12.5 MG tablet, Take 12.5 mg by mouth daily.  (Patient not taking: Reported on 06/15/2020), Disp: , Rfl:  .  hydrochlorothiazide (MICROZIDE) 12.5 MG capsule, Take 12.5-25 mg by mouth daily as needed. (Patient not taking: Reported on 06/15/2020), Disp: , Rfl:  .  isosorbide mononitrate (IMDUR) 30 MG 24 hr tablet, Take 30 mg by mouth daily., Disp: , Rfl:  .  levocetirizine (XYZAL) 5 MG tablet, Take 5 mg by mouth at bedtime., Disp: , Rfl:  .  losartan (COZAAR) 25 MG tablet, Take 25 mg by mouth daily., Disp: , Rfl:  .  metFORMIN (GLUCOPHAGE) 500 MG tablet, Take 500 mg by mouth 2 (two) times daily. (Patient not taking: Reported on 06/15/2020), Disp: , Rfl:  .  metoprolol succinate (TOPROL-XL) 50 MG  24 hr tablet, Take 50 mg by mouth daily., Disp: , Rfl:  .  nitroGLYCERIN (NITROSTAT) 0.4 MG SL tablet, SMARTSIG:1 Sublingual 4-5 Times Daily, Disp: , Rfl:  .  oxybutynin (DITROPAN) 5 MG tablet, , Disp: , Rfl:  .  pregabalin (LYRICA) 50 MG capsule, Take 1 capsule (50 mg total) by mouth 3 (three) times daily., Disp: 90 capsule, Rfl: 2 .  pregabalin (LYRICA) 50 MG capsule, Take 50 mg by mouth 3 (three) times daily. (Patient not taking: Reported on 06/15/2020), Disp: , Rfl:  .  traMADol (ULTRAM) 50 MG tablet, Take 1-2 tablets (50-100 mg total) by mouth at bedtime. Must last 30 days, Disp: 60 tablet, Rfl: 2 .  traMADol (ULTRAM) 50 MG  tablet, TAKE 1 2 TABLETS (50 100 MG TOTAL) BY MOUTH AT BEDTIME AS NEEDED FOR SEVERE PAIN. MUST LAST 30 DAYS (Patient not taking: Reported on 06/15/2020), Disp: , Rfl:  .  Vitamin D, Ergocalciferol, (DRISDOL) 1.25 MG (50000 UNIT) CAPS capsule, Take 50,000 Units by mouth 2 (two) times a week. (Patient not taking: Reported on 06/15/2020), Disp: , Rfl:   Past Medical History: Past Medical History:  Diagnosis Date  . Abdominal pain   . Acid reflux 01/04/2014  . Acute respiratory failure with hypoxemia ([place]) 09/20/2014  . Anemia   . Anxiety   . Arthritis   . Asthma   . Benign neoplasm of sigmoid colon   . Billowing mitral valve   . Calculus of kidney 02/07/2013  . Complication of anesthesia    pneumonia after anesthesia  . COPD (chronic obstructive pulmonary disease) ([place])   . Depression   . Diabetes mellitus without complication ([place])   . Diverticulitis   . Essential (primary) hypertension 01/10/2015  . GERD (gastroesophageal reflux disease)   . Hypertension   . Incomplete emptying of bladder 07/05/2014  . Kidney stones   . Neuropathy   . PVC (premature ventricular contraction)   . Sciatica of left side   . Septic shock ([place]) 2015  . Sigmoid diverticulitis 01/27/2017  . Sleep apnea    CPAP  . Tachycardia 04/17/2014  . Thrombocytopenia ([place]) 09/20/2014    Tobacco Use: Social History   Tobacco Use  Smoking Status Former Smoker  . Quit date: 01/27/2000  . Years since quitting: 20.4  Smokeless Tobacco Never Used    Labs: Recent Review Scientist, physiological    Labs for ITP Cardiac and Pulmonary Rehab Latest Ref Rng & Units 09/21/2017 09/22/2017   Trlycerides <150 mg/dL - 231(H)   PHART 7.35 - 7.45 7.28(L) -   PCO2ART 32 - 48 mmHg 52(H) -   HCO3 20.0 - 28.0 mmol/L 24.4 -   ACIDBASEDEF 0.0 - 2.0 mmol/L 3.1(H) -   O2SAT % 91.4 -       Exercise Target Goals: Exercise Program Goal: Individual exercise prescription set using results from initial 6 min walk test and THRR while considering   patient's activity barriers and safety.   Exercise Prescription Goal: Initial exercise prescription builds to 30-45 minutes a day of aerobic activity, 2-3 days per week.  Home exercise guidelines will be given to patient during program as part of exercise prescription that the participant will acknowledge.   Education: Aerobic Exercise: - Group verbal and visual presentation on the components of exercise prescription. Introduces F.I.T.T principle from ACSM for exercise prescriptions.  Reviews F.I.T.T. principles of aerobic exercise including progression. Written material given at graduation.   Education: Resistance Exercise: - Group verbal and visual presentation on the components  of exercise prescription. Introduces F.I.T.T principle from ACSM for exercise prescriptions  Reviews F.I.T.T. principles of resistance exercise including progression. Written material given at graduation.    Education: Exercise & Equipment Safety: - Individual verbal instruction and demonstration of equipment use and safety with use of the equipment.   Cardiac Rehab from 06/28/2020 in [facility] Cardiac and Pulmonary Rehab  Date 06/28/20  Educator AS  Instruction Review Code 1- Verbalizes Understanding      Education: Exercise Physiology & General Exercise Guidelines: - Group verbal and written instruction with models to review the exercise physiology of the cardiovascular system and associated critical values. Provides general exercise guidelines with specific guidelines to those with heart or lung disease.    Education: Flexibility, Balance, Mind/Body Relaxation: - Group verbal and visual presentation with interactive activity on the components of exercise prescription. Introduces F.I.T.T principle from ACSM for exercise prescriptions. Reviews F.I.T.T. principles of flexibility and balance exercise training including progression. Also discusses the mind body connection.  Reviews various relaxation techniques to help  reduce and manage stress (i.e. Deep breathing, progressive muscle relaxation, and visualization). Balance handout provided to take home. Written material given at graduation.   Activity Barriers & Risk Stratification:  Activity Barriers & Cardiac Risk Stratification - 06/15/20 1403      Activity Barriers & Cardiac Risk Stratification   Activity Barriers Other (comment)    Comments neuropathy in both legs    Cardiac Risk Stratification High           6 Minute Walk:  6 Minute Walk    Row Name 06/28/20 1640         6 Minute Walk   Phase Initial     Distance 1615 feet     Walk Time 6 minutes     # of Rest Breaks 0     MPH 3     METS 4.25     RPE 15     Perceived Dyspnea  3     VO2 Peak 14.86     Symptoms Yes (comment)     Comments hip pain 5/10     Resting HR 93 bpm     Resting BP 108/64     Resting Oxygen Saturation  97 %     Exercise Oxygen Saturation  during 6 min walk 96 %     Max Ex. HR 132 bpm     Max Ex. BP 152/76     2 Minute Post BP 116/66            Oxygen Initial Assessment:   Oxygen Re-Evaluation:   Oxygen Discharge (Final Oxygen Re-Evaluation):   Initial Exercise Prescription:  Initial Exercise Prescription - 06/28/20 1600      Date of Initial Exercise RX and Referring Provider   Date 06/28/20    Referring Provider Tamala Julian      Recumbant Bike   Level 4    RPM 60    Watts 76    Minutes 15    METs 4.25      NuStep   Level 4    SPM 80    Minutes 15    METs 4.25      REL-XR   Level 6    Watts 76    Speed 50    Minutes 15    METs 4.25      T5 Nustep   Level 3    SPM 80    Minutes 15    METs 4.25  Biostep-RELP   Level 4    SPM 50    Minutes 15    METs 4      Prescription Details   Frequency (times per week) 3    Duration Progress to 30 minutes of continuous aerobic without signs/symptoms of physical distress      Intensity   THRR 40-80% of Max Heartrate 119-147    Ratings of Perceived Exertion 11-15    Perceived  Dyspnea 0-4      Resistance Training   Training Prescription Yes    Weight 5 lb    Reps 10-15           Perform Capillary Blood Glucose checks as needed.  Exercise Prescription Changes:  Exercise Prescription Changes    Row Name 06/28/20 1600             Response to Exercise   Blood Pressure (Admit) 108/64       Blood Pressure (Exercise) 152/76       Blood Pressure (Exit) 116/66       Heart Rate (Admit) 93 bpm       Heart Rate (Exercise) 135 bpm       Heart Rate (Exit) 99 bpm       Oxygen Saturation (Admit) 97 %       Oxygen Saturation (Exercise) 96 %       Rating of Perceived Exertion (Exercise) 15       Perceived Dyspnea (Exercise) 3       Symptoms hip pain 5/10              Exercise Comments:   Exercise Goals and Review:  Exercise Goals    Row Name 06/28/20 1645             Exercise Goals   Increase Physical Activity Yes       Intervention Provide advice, education, support and counseling about physical activity/exercise needs.;Develop an individualized exercise prescription for aerobic and resistive training based on initial evaluation findings, risk stratification, comorbidities and participant's personal goals.       Expected Outcomes Short Term: Attend rehab on a regular basis to increase amount of physical activity.;Long Term: Add in home exercise to make exercise part of routine and to increase amount of physical activity.;Long Term: Exercising regularly at least 3-5 days a week.       Increase Strength and Stamina Yes       Intervention Provide advice, education, support and counseling about physical activity/exercise needs.;Develop an individualized exercise prescription for aerobic and resistive training based on initial evaluation findings, risk stratification, comorbidities and participant's personal goals.       Expected Outcomes Short Term: Increase workloads from initial exercise prescription for resistance, speed, and METs.;Short Term: Perform  resistance training exercises routinely during rehab and add in resistance training at home;Long Term: Improve cardiorespiratory fitness, muscular endurance and strength as measured by increased METs and functional capacity (6MWT)       Able to understand and use rate of perceived exertion (RPE) scale Yes       Intervention Provide education and explanation on how to use RPE scale       Expected Outcomes Short Term: Able to use RPE daily in rehab to express subjective intensity level;Long Term:  Able to use RPE to guide intensity level when exercising independently       Able to understand and use Dyspnea scale Yes       Intervention Provide education and explanation on how  to use Dyspnea scale       Expected Outcomes Short Term: Able to use Dyspnea scale daily in rehab to express subjective sense of shortness of breath during exertion;Long Term: Able to use Dyspnea scale to guide intensity level when exercising independently       Knowledge and understanding of Target Heart Rate Range (THRR) Yes       Intervention Provide education and explanation of THRR including how the numbers were predicted and where they are located for reference       Expected Outcomes Short Term: Able to state/look up THRR;Short Term: Able to use daily as guideline for intensity in rehab;Long Term: Able to use THRR to govern intensity when exercising independently       Able to check pulse independently Yes       Intervention Provide education and demonstration on how to check pulse in carotid and radial arteries.;Review the importance of being able to check your own pulse for safety during independent exercise       Expected Outcomes Short Term: Able to explain why pulse checking is important during independent exercise;Long Term: Able to check pulse independently and accurately       Understanding of Exercise Prescription Yes       Intervention Provide education, explanation, and written materials on patient's individual  exercise prescription       Expected Outcomes Short Term: Able to explain program exercise prescription;Long Term: Able to explain home exercise prescription to exercise independently              Exercise Goals Re-Evaluation :   Discharge Exercise Prescription (Final Exercise Prescription Changes):  Exercise Prescription Changes - 06/28/20 1600      Response to Exercise   Blood Pressure (Admit) 108/64    Blood Pressure (Exercise) 152/76    Blood Pressure (Exit) 116/66    Heart Rate (Admit) 93 bpm    Heart Rate (Exercise) 135 bpm    Heart Rate (Exit) 99 bpm    Oxygen Saturation (Admit) 97 %    Oxygen Saturation (Exercise) 96 %    Rating of Perceived Exertion (Exercise) 15    Perceived Dyspnea (Exercise) 3    Symptoms hip pain 5/10           Nutrition:  Target Goals: Understanding of nutrition guidelines, daily intake of sodium 1500mg , cholesterol 200mg , calories 30% from fat and 7% or less from saturated fats, daily to have 5 or more servings of fruits and vegetables.  Education: All About Nutrition: -Group instruction provided by verbal, written material, interactive activities, discussions, models, and posters to present general guidelines for heart healthy nutrition including fat, fiber, MyPlate, the role of sodium in heart healthy nutrition, utilization of the nutrition label, and utilization of this knowledge for meal planning. Follow up email sent as well. Written material given at graduation.   Biometrics:  Pre Biometrics - 06/28/20 1646      Pre Biometrics   Height 5' 10.5" (1.791 m)    Weight 234 lb 4.8 oz (106.3 kg)    BMI (Calculated) 33.13            Nutrition Therapy Plan and Nutrition Goals:   Nutrition Assessments:  Nutrition Assessments - 06/28/20 1647      MEDFICTS Scores   Pre Score 24           MEDIFICTS Score Key:          ?70 Need to make dietary changes  40-70 Heart Healthy Diet         ? 40 Therapeutic Level  Cholesterol Diet  Nutrition Goals Re-Evaluation:   Nutrition Goals Discharge (Final Nutrition Goals Re-Evaluation):   Psychosocial: Target Goals: Acknowledge presence or absence of significant depression and/or stress, maximize coping skills, provide positive support system. Participant is able to verbalize types and ability to use techniques and skills needed for reducing stress and depression.   Education: Stress, Anxiety, and Depression - Group verbal and visual presentation to define topics covered.  Reviews how body is impacted by stress, anxiety, and depression.  Also discusses healthy ways to reduce stress and to treat/manage anxiety and depression.  Written material given at graduation.   Education: Sleep Hygiene -Provides group verbal and written instruction about how sleep can affect your health.  Define sleep hygiene, discuss sleep cycles and impact of sleep habits. Review good sleep hygiene tips.    Initial Review & Psychosocial Screening:  Initial Psych Review & Screening - 06/15/20 1408      Initial Review   Current issues with Current Stress Concerns;Current Sleep Concerns    Source of Stress Concerns Family      Family Dynamics   Good Support System? Yes      Barriers   Psychosocial barriers to participate in program There are no identifiable barriers or psychosocial needs.;The patient should benefit from training in stress management and relaxation.      Screening Interventions   Interventions Encouraged to exercise;To provide support and resources with identified psychosocial needs;Provide feedback about the scores to participant    Expected Outcomes Short Term goal: Utilizing psychosocial counselor, staff and physician to assist with identification of specific Stressors or current issues interfering with healing process. Setting desired goal for each stressor or current issue identified.;Long Term Goal: Stressors or current issues are controlled or  eliminated.;Short Term goal: Identification and review with participant of any Quality of Life or Depression concerns found by scoring the questionnaire.;Long Term goal: The participant improves quality of Life and PHQ9 Scores as seen by post scores and/or verbalization of changes           Quality of Life Scores:   Quality of Life - 06/28/20 1648      Quality of Life   Select Quality of Life      Quality of Life Scores   Health/Function Pre 20.73 %    Socioeconomic Pre 19.25 %    Psych/Spiritual Pre 28.29 %    Family Pre 19.7 %    GLOBAL Pre 21.91 %          Scores of 19 and below usually indicate a poorer quality of life in these areas.  A difference of  2-3 points is a clinically meaningful difference.  A difference of 2-3 points in the total score of the Quality of Life Index has been associated with significant improvement in overall quality of life, self-image, physical symptoms, and general health in studies assessing change in quality of life.  PHQ-9: Recent Review Flowsheet Data    Depression screen [facility] 2/9 06/28/2020 09/30/2019   Decreased Interest 0 0   Down, Depressed, Hopeless 0 0   PHQ - 2 Score 0 0   Altered sleeping 1 -   Tired, decreased energy 1 -   Change in appetite 2 -   Feeling bad or failure about yourself  0 -   Trouble concentrating 0 -   Moving slowly or fidgety/restless 0 -   Suicidal thoughts 0 -  PHQ-9 Score 4 -     Interpretation of Total Score  Total Score Depression Severity:  1-4 = Minimal depression, 5-9 = Mild depression, 10-14 = Moderate depression, 15-19 = Moderately severe depression, 20-27 = Severe depression   Psychosocial Evaluation and Intervention:  Psychosocial Evaluation - 06/15/20 1412      Psychosocial Evaluation & Interventions   Interventions Stress management education;Encouraged to exercise with the program and follow exercise prescription;Relaxation education    Comments 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 The dogs  keep her very busy but she enjoys it. She states her husband of 40+ years causes some stress, but she stands her ground. She struggles with neuropathy in both legs which causes a great deal of pain. She has a hard time going to sleep because of the pain. She is hoping this will help with her balance concerns and stamina.    Expected Outcomes Short: attend cardiac rehab for education and exercise. Long: develop positive self care habits.    Continue Psychosocial Services  Follow up required by staff           Psychosocial Re-Evaluation:   Psychosocial Discharge (Final Psychosocial Re-Evaluation):   Vocational Rehabilitation: Provide vocational rehab assistance to qualifying candidates.   Vocational Rehab Evaluation & Intervention:  Vocational Rehab - 06/15/20 1408      Initial Vocational Rehab Evaluation & Intervention   Assessment shows need for Vocational Rehabilitation No           Education: Education Goals: Education classes will be provided on a variety of topics geared toward better understanding of heart health and risk factor modification. Participant will state understanding/return demonstration of topics presented as noted by education test scores.  Learning Barriers/Preferences:  Learning Barriers/Preferences - 06/15/20 1408      Learning Barriers/Preferences   Learning Barriers None    Learning Preferences None           General Cardiac Education Topics:  AED/CPR: - Group verbal and written instruction with the use of models to demonstrate the basic use of the AED with the basic ABC's of resuscitation.   Anatomy and Cardiac Procedures: - Group verbal and visual presentation and models provide information about basic cardiac anatomy and function. Reviews the testing methods done to diagnose heart disease and the outcomes of the test results. Describes the treatment choices: Medical Management, Angioplasty, or Coronary Bypass Surgery for treating various  heart conditions including Myocardial Infarction, Angina, Valve Disease, and Cardiac Arrhythmias.  Written material given at graduation.   Medication Safety: - Group verbal and visual instruction to review commonly prescribed medications for heart and lung disease. Reviews the medication, class of the drug, and side effects. Includes the steps to properly store meds and maintain the prescription regimen.  Written material given at graduation.   Intimacy: - Group verbal instruction through game format to discuss how heart and lung disease can affect sexual intimacy. Written material given at graduation..   Know Your Numbers and Heart Failure: - Group verbal and visual instruction to discuss disease risk factors for cardiac and pulmonary disease and treatment options.  Reviews associated critical values for Overweight/Obesity, Hypertension, Cholesterol, and Diabetes.  Discusses basics of heart failure: signs/symptoms and treatments.  Introduces Heart Failure Zone chart for action plan for heart failure.  Written material given at graduation.   Infection Prevention: - Provides verbal and written material to individual with discussion of infection control including proper hand washing and proper equipment cleaning during exercise session.  Cardiac Rehab from 06/28/2020 in [facility] Cardiac and Pulmonary Rehab  Date 06/28/20  Educator AS  Instruction Review Code 1- Verbalizes Understanding      Falls Prevention: - Provides verbal and written material to individual with discussion of falls prevention and safety.   Cardiac Rehab from 06/28/2020 in [facility] Cardiac and Pulmonary Rehab  Date 06/28/20  Educator AS  Instruction Review Code 1- Verbalizes Understanding      Other: -Provides group and verbal instruction on various topics (see comments)   Knowledge Questionnaire Score:  Knowledge Questionnaire Score - 06/28/20 1648      Knowledge Questionnaire Score   Pre Score 23/26            Core Components/Risk Factors/Patient Goals at Admission:  Personal Goals and Risk Factors at Admission - 06/28/20 1647      Core Components/Risk Factors/Patient Goals on Admission    Weight Management Yes    Intervention Weight Management: Develop a combined nutrition and exercise program designed to reach desired caloric intake, while maintaining appropriate intake of nutrient and fiber, sodium and fats, and appropriate energy expenditure required for the weight goal.    Admit Weight 234 lb 4.8 oz (106.3 kg)    Goal Weight: Short Term 230 lb (104.3 kg)    Goal Weight: Long Term 225 lb (102.1 kg)    Expected Outcomes Short Term: Continue to assess and modify interventions until short term weight is achieved;Long Term: Adherence to nutrition and physical activity/exercise program aimed toward attainment of established weight goal;Understanding recommendations for meals to include 15-35% energy as protein, 25-35% energy from fat, 35-60% energy from carbohydrates, less than 200mg  of dietary cholesterol, 20-35 gm of total fiber daily;Understanding of distribution of calorie intake throughout the day with the consumption of 4-5 meals/snacks    Diabetes Yes    Intervention Provide education about signs/symptoms and action to take for hypo/hyperglycemia.;Provide education about proper nutrition, including hydration, and aerobic/resistive exercise prescription along with prescribed medications to achieve blood glucose in normal ranges: Fasting glucose 65-99 mg/dL    Expected Outcomes Short Term: Participant verbalizes understanding of the signs/symptoms and immediate care of hyper/hypoglycemia, proper foot care and importance of medication, aerobic/resistive exercise and nutrition plan for blood glucose control.;Long Term: Attainment of HbA1C < 7%.    Hypertension Yes    Intervention Provide education on lifestyle modifcations including regular physical activity/exercise, weight management, moderate  sodium restriction and increased consumption of fresh fruit, vegetables, and low fat dairy, alcohol moderation, and smoking cessation.;Monitor prescription use compliance.    Expected Outcomes Short Term: Continued assessment and intervention until BP is < 140/54mm HG in hypertensive participants. < 130/62mm HG in hypertensive participants with diabetes, heart failure or chronic kidney disease.;Long Term: Maintenance of blood pressure at goal levels.    Lipids Yes    Intervention Provide education and support for participant on nutrition & aerobic/resistive exercise along with prescribed medications to achieve LDL 70mg , HDL >40mg .    Expected Outcomes Short Term: Participant states understanding of desired cholesterol values and is compliant with medications prescribed. Participant is following exercise prescription and nutrition guidelines.;Long Term: Cholesterol controlled with medications as prescribed, with individualized exercise RX and with personalized nutrition plan. Value goals: LDL < 70mg , HDL > 40 mg.           Education:Diabetes - Individual verbal and written instruction to review signs/symptoms of diabetes, desired ranges of glucose level fasting, after meals and with exercise. Acknowledge that pre and post exercise glucose checks will be  done for 3 sessions at entry of program.   Cardiac Rehab from 06/15/2020 in [facility] Cardiac [facility] Re[facility]  Date 06/15/20  Educator [facility]  Instruction Review Code 1- Verbalizes Understanding      Core Components/Risk Factors/Patient Goals Review:    Core Components/Risk Factors/Patient Goals at Discharge (Final Review):    ITP Comments:  ITP Comments    Row Name 06/15/20 1418           ITP Comments Initial telephone orientation completed. Diagnosis can be found in [facility] 10/8. EP orientation scheduled for Thursday 11/4 at 3:30.              Comments: initial ITP

## 2020-06-28 NOTE — Patient Instructions (Signed)
Patient Instructions  Patient Details  Name: Grace Hester MRN: [id number] Date of Birth: May 29, 1961 Referring Provider:  Sandria Bales, MD  Below are your personal goals for exercise, nutrition, and risk factors. Our goal is to help you stay on track towards obtaining and maintaining these goals. We will be discussing your progress on these goals with you throughout the program.  Initial Exercise Prescription:  Initial Exercise Prescription - 06/28/20 1600      Date of Initial Exercise RX and Referring Provider   Date 06/28/20    Referring Provider Tamala Julian      Recumbant Bike   Level 4    RPM 60    Watts 76    Minutes 15    METs 4.25      NuStep   Level 4    SPM 80    Minutes 15    METs 4.25      REL-XR   Level 6    Watts 76    Speed 50    Minutes 15    METs 4.25      T5 Nustep   Level 3    SPM 80    Minutes 15    METs 4.25      Biostep-RELP   Level 4    SPM 50    Minutes 15    METs 4      Prescription Details   Frequency (times per week) 3    Duration Progress to 30 minutes of continuous aerobic without signs/symptoms of physical distress      Intensity   THRR 40-80% of Max Heartrate 119-147    Ratings of Perceived Exertion 11-15    Perceived Dyspnea 0-4      Resistance Training   Training Prescription Yes    Weight 5 lb    Reps 10-15           Exercise Goals: Frequency: Be able to perform aerobic exercise two to three times per week in program working toward 2-5 days per week of home exercise.  Intensity: Work with a perceived exertion of 11 (fairly light) - 15 (hard) while following your exercise prescription.  We will make changes to your prescription with you as you progress through the program.   Duration: Be able to do 30 to 45 minutes of continuous aerobic exercise in addition to a 5 minute warm-up and a 5 minute cool-down routine.   Nutrition Goals: Your personal nutrition goals will be established when you do your nutrition  analysis with the dietician.  The following are general nutrition guidelines to follow: Cholesterol < 200mg /day Sodium < 1500mg /day Fiber: Women over 50 yrs - 21 grams per day  Personal Goals:  Personal Goals and Risk Factors at Admission - 06/28/20 1647      Core Components/Risk Factors/Patient Goals on Admission    Weight Management Yes    Intervention Weight Management: Develop a combined nutrition and exercise program designed to reach desired caloric intake, while maintaining appropriate intake of nutrient and fiber, sodium and fats, and appropriate energy expenditure required for the weight goal.    Admit Weight 234 lb 4.8 oz (106.3 kg)    Goal Weight: Short Term 230 lb (104.3 kg)    Goal Weight: Long Term 225 lb (102.1 kg)    Expected Outcomes Short Term: Continue to assess and modify interventions until short term weight is achieved;Long Term: Adherence to nutrition and physical activity/exercise program aimed toward attainment of established weight goal;Understanding recommendations for  meals to include 15-35% energy as protein, 25-35% energy from fat, 35-60% energy from carbohydrates, less than 200mg  of dietary cholesterol, 20-35 gm of total fiber daily;Understanding of distribution of calorie intake throughout the day with the consumption of 4-5 meals/snacks    Diabetes Yes    Intervention Provide education about signs/symptoms and action to take for hypo/hyperglycemia.;Provide education about proper nutrition, including hydration, and aerobic/resistive exercise prescription along with prescribed medications to achieve blood glucose in normal ranges: Fasting glucose 65-99 mg/dL    Expected Outcomes Short Term: Participant verbalizes understanding of the signs/symptoms and immediate care of hyper/hypoglycemia, proper foot care and importance of medication, aerobic/resistive exercise and nutrition plan for blood glucose control.;Long Term: Attainment of HbA1C < 7%.    Hypertension Yes     Intervention Provide education on lifestyle modifcations including regular physical activity/exercise, weight management, moderate sodium restriction and increased consumption of fresh fruit, vegetables, and low fat dairy, alcohol moderation, and smoking cessation.;Monitor prescription use compliance.    Expected Outcomes Short Term: Continued assessment and intervention until BP is < 140/2mm HG in hypertensive participants. < 130/30mm HG in hypertensive participants with diabetes, heart failure or chronic kidney disease.;Long Term: Maintenance of blood pressure at goal levels.    Lipids Yes    Intervention Provide education and support for participant on nutrition & aerobic/resistive exercise along with prescribed medications to achieve LDL 70mg , HDL >40mg .    Expected Outcomes Short Term: Participant states understanding of desired cholesterol values and is compliant with medications prescribed. Participant is following exercise prescription and nutrition guidelines.;Long Term: Cholesterol controlled with medications as prescribed, with individualized exercise RX and with personalized nutrition plan. Value goals: LDL < 70mg , HDL > 40 mg.           Tobacco Use Initial Evaluation: Social History   Tobacco Use  Smoking Status Former Smoker  . Quit date: 01/27/2000  . Years since quitting: 20.4  Smokeless Tobacco Never Used    Exercise Goals and Review:  Exercise Goals    Row Name 06/28/20 1645             Exercise Goals   Increase Physical Activity Yes       Intervention Provide advice, education, support and counseling about physical activity/exercise needs.;Develop an individualized exercise prescription for aerobic and resistive training based on initial evaluation findings, risk stratification, comorbidities and participant's personal goals.       Expected Outcomes Short Term: Attend rehab on a regular basis to increase amount of physical activity.;Long Term: Add in home exercise  to make exercise part of routine and to increase amount of physical activity.;Long Term: Exercising regularly at least 3-5 days a week.       Increase Strength and Stamina Yes       Intervention Provide advice, education, support and counseling about physical activity/exercise needs.;Develop an individualized exercise prescription for aerobic and resistive training based on initial evaluation findings, risk stratification, comorbidities and participant's personal goals.       Expected Outcomes Short Term: Increase workloads from initial exercise prescription for resistance, speed, and METs.;Short Term: Perform resistance training exercises routinely during rehab and add in resistance training at home;Long Term: Improve cardiorespiratory fitness, muscular endurance and strength as measured by increased METs and functional capacity (6MWT)       Able to understand and use rate of perceived exertion (RPE) scale Yes       Intervention Provide education and explanation on how to use RPE scale  Expected Outcomes Short Term: Able to use RPE daily in rehab to express subjective intensity level;Long Term:  Able to use RPE to guide intensity level when exercising independently       Able to understand and use Dyspnea scale Yes       Intervention Provide education and explanation on how to use Dyspnea scale       Expected Outcomes Short Term: Able to use Dyspnea scale daily in rehab to express subjective sense of shortness of breath during exertion;Long Term: Able to use Dyspnea scale to guide intensity level when exercising independently       Knowledge and understanding of Target Heart Rate Range (THRR) Yes       Intervention Provide education and explanation of THRR including how the numbers were predicted and where they are located for reference       Expected Outcomes Short Term: Able to state/look up THRR;Short Term: Able to use daily as guideline for intensity in rehab;Long Term: Able to use THRR to  govern intensity when exercising independently       Able to check pulse independently Yes       Intervention Provide education and demonstration on how to check pulse in carotid and radial arteries.;Review the importance of being able to check your own pulse for safety during independent exercise       Expected Outcomes Short Term: Able to explain why pulse checking is important during independent exercise;Long Term: Able to check pulse independently and accurately       Understanding of Exercise Prescription Yes       Intervention Provide education, explanation, and written materials on patient's individual exercise prescription       Expected Outcomes Short Term: Able to explain program exercise prescription;Long Term: Able to explain home exercise prescription to exercise independently              Copy of goals given to participant.

## 2020-07-02 ENCOUNTER — Other Ambulatory Visit: Payer: Self-pay

## 2020-07-02 DIAGNOSIS — Z955 Presence of coronary angioplasty implant and graft: Secondary | ICD-10-CM | POA: Diagnosis not present

## 2020-07-02 LAB — GLUCOSE, CAPILLARY
Glucose-Capillary: 144 mg/dL — ABNORMAL HIGH (ref 70–99)
Glucose-Capillary: 119 mg/dL — ABNORMAL HIGH (ref 70–99)

## 2020-07-02 NOTE — Progress Notes (Signed)
Daily Session Note  Patient Details  Name: Grace Hester MRN: [id number] Date of Birth: Jan 11, 1961 Referring Provider:     Cardiac Rehab from 06/28/2020 in [facility] Cardiac [facility] Pulmonary Rehab  Referring Provider Tamala Julian      Encounter Date: 07/02/2020  Check In:  Session Check In - 07/02/20 1634      Check-In   Supervising physician immediately available to respond to emergencies See telemetry face sheet for immediately available ER MD    Location [facility]-Cardiac & Pulmonary Rehab    Staff Present Renita Papa, RN Moises Blood, BS, ACSM CEP, Exercise Physiologist;Kamirah Shugrue Rosalia Hammers, MPA, Elveria Rising, BA, ACSM CEP, Exercise Physiologist    Virtual Visit No    Medication changes reported     No    Fall or balance concerns reported    No    Warm-up and Cool-down Performed on first and last piece of equipment    Resistance Training Performed Yes    VAD Patient? No    PAD/SET Patient? No      Pain Assessment   Currently in Pain? No/denies              Social History   Tobacco Use  Smoking Status Former Smoker  . Quit date: 01/27/2000  . Years since quitting: 20.4  Smokeless Tobacco Never Used    Goals Met:  Independence with exercise equipment Exercise tolerated well No report of cardiac concerns or symptoms Strength training completed today  Goals Unmet:  Not Applicable  Comments: First full day of exercise!  Patient was oriented to gym and equipment including functions, settings, policies, and procedures.  Patient's individual exercise prescription and treatment plan were reviewed.  All starting workloads were established based on the results of the 6 minute walk test done at initial orientation visit.  The plan for exercise progression was also introduced and progression will be customized based on patient's performance and goals.    Dr. Emily Filbert is Medical Director for [facility].

## 2020-07-05 ENCOUNTER — Other Ambulatory Visit: Payer: Self-pay

## 2020-07-05 ENCOUNTER — Encounter: Payer: BC Managed Care – PPO | Admitting: *Deleted

## 2020-07-05 DIAGNOSIS — Z955 Presence of coronary angioplasty implant and graft: Secondary | ICD-10-CM

## 2020-07-05 LAB — GLUCOSE, CAPILLARY
Glucose-Capillary: 246 mg/dL — ABNORMAL HIGH (ref 70–99)
Glucose-Capillary: 186 mg/dL — ABNORMAL HIGH (ref 70–99)

## 2020-07-05 NOTE — Progress Notes (Signed)
Daily Session Note  Patient Details  Name: Grace Hester MRN: [id number] Date of Birth: 1961-04-21 Referring Provider:     Cardiac Rehab from 06/28/2020 in [facility] Cardiac and Pulmonary Rehab  Referring Provider Tamala Julian      Encounter Date: 07/05/2020  Check In:  Session Check In - 07/05/20 1616      Check-In   Supervising physician immediately available to respond to emergencies See telemetry face sheet for immediately available ER MD    Location [facility]-Cardiac & Pulmonary Rehab    Staff Present Renita Papa, RN BSN;Joseph [address] [place], [place], [place], CCRP, [place], [place], ACSM CEP, Exercise Physiologist    Virtual Visit No    Medication changes reported     No    Fall or balance concerns reported    No    Warm-up and Cool-down Performed on first and last piece of equipment    Resistance Training Performed Yes    VAD Patient? No    PAD/SET Patient? No      Pain Assessment   Currently in Pain? No/denies              Social History   Tobacco Use  Smoking Status Former Smoker  . Quit date: 01/27/2000  . Years since quitting: 20.4  Smokeless Tobacco Never Used    Goals Met:  Independence with exercise equipment Exercise tolerated well No report of cardiac concerns or symptoms Strength training completed today  Goals Unmet:  Not Applicable  Comments: Pt able to follow exercise prescription today without complaint.  Will continue to monitor for progression.    Dr. Emily Filbert is Medical Director for [facility].

## 2020-07-16 ENCOUNTER — Telehealth: Payer: Self-pay

## 2020-07-16 NOTE — Telephone Encounter (Signed)
Grace Hester called to tell us she will not be here for at least a couple of weeks straightening out her medication as it is causing her fatigue and body aches, last week she went to the ED as it was so bad. Took out her appointments for the next two weeks and will call her Monday 12/6 to check in.

## 2020-07-25 ENCOUNTER — Encounter: Payer: Self-pay | Admitting: *Deleted

## 2020-07-25 DIAGNOSIS — Z955 Presence of coronary angioplasty implant and graft: Secondary | ICD-10-CM

## 2020-07-25 NOTE — Progress Notes (Signed)
Cardiac Individual Treatment Plan  Patient Details  Name: Grace Hester MRN: [id number] Date of Birth: [birth year] Referring Provider:     Cardiac Rehab from 06/28/2020 in [facility]  Referring Provider Tamala Julian      Initial Encounter Date:    Cardiac Rehab from 06/28/2020 in [facility] Re[facility]  Date 06/28/20      Visit Diagnosis: Status post coronary artery stent placement  Patient's Home Medications on Admission:  Current Outpatient Medications:  .  albuterol (PROVENTIL HFA;VENTOLIN HFA) 108 (90 Base) MCG/ACT inhaler, Inhale 1-2 puffs into the lungs every 6 (six) hours as needed for wheezing or shortness of breath. (Patient not taking: Reported on 06/15/2020), Disp: 1 Inhaler, Rfl: 0 .  albuterol (VENTOLIN HFA) 108 (90 Base) MCG/ACT inhaler, , Disp: , Rfl:  .  amitriptyline (ELAVIL) 10 MG tablet, , Disp: , Rfl:  .  atorvastatin (LIPITOR) 40 MG tablet, Take 40 mg by mouth at bedtime., Disp: , Rfl:  .  busPIRone (BUSPAR) 10 MG tablet, Take 10 mg by mouth 2 (two) times daily. 2 tabs twice per day, Disp: , Rfl:  .  busPIRone (BUSPAR) 10 MG tablet, Take 20 mg by mouth 2 (two) times daily., Disp: , Rfl:  .  calcium carbonate (CALCIUM 600) 600 MG TABS tablet, Take 2 tablets (1,200 mg total) by mouth daily with breakfast., Disp: 60 tablet, Rfl: 5 .  chlorpheniramine-HYDROcodone (TUSSIONEX PENNKINETIC ER) 10-8 MG/5ML SUER, Take 5 mLs by mouth every 12 (twelve) hours as needed for cough. Will causes drowsiness; NO DRIVING. (Patient not taking: Reported on 06/15/2020), Disp: 70 mL, Rfl: 0 .  clobetasol cream (TEMOVATE) 0.05 %, APPLY TO AFFECTED AREA TWICE A DAY (Patient not taking: Reported on 06/15/2020), Disp: , Rfl:  .  clopidogrel (PLAVIX) 75 MG tablet, Take 75 mg by mouth daily., Disp: , Rfl:  .  [employer] D3 125 MCG (5000 UT) capsule, Take 5,000 Units by mouth every morning. (Patient not taking: Reported on 06/15/2020), Disp: , Rfl:  .  doxepin (SINEQUAN) 50  MG capsule, Take 50 mg by mouth at bedtime., Disp: , Rfl:  .  DULoxetine (CYMBALTA) 60 MG capsule, Take 120 mg by mouth daily. (Patient not taking: Reported on 06/15/2020), Disp: , Rfl:  .  esomeprazole (NEXIUM) 20 MG capsule, Take 20 mg by mouth every morning. , Disp: , Rfl:  .  EUCRISA 2 % OINT, Apply 1 application topically at bedtime.  (Patient not taking: Reported on 06/15/2020), Disp: , Rfl: 1 .  fexofenadine (ALLEGRA) 180 MG tablet, Take 180 mg by mouth daily. , Disp: , Rfl:  .  hydrochlorothiazide (HYDRODIURIL) 12.5 MG tablet, Take 12.5 mg by mouth daily.  (Patient not taking: Reported on 06/15/2020), Disp: , Rfl:  .  hydrochlorothiazide (MICROZIDE) 12.5 MG capsule, Take 12.5-25 mg by mouth daily as needed. (Patient not taking: Reported on 06/15/2020), Disp: , Rfl:  .  isosorbide mononitrate (IMDUR) 30 MG 24 hr tablet, Take 30 mg by mouth daily., Disp: , Rfl:  .  levocetirizine (XYZAL) 5 MG tablet, Take 5 mg by mouth at bedtime., Disp: , Rfl:  .  losartan (COZAAR) 25 MG tablet, Take 25 mg by mouth daily., Disp: , Rfl:  .  metFORMIN (GLUCOPHAGE) 500 MG tablet, Take 500 mg by mouth 2 (two) times daily. (Patient not taking: Reported on 06/15/2020), Disp: , Rfl:  .  metoprolol succinate (TOPROL-XL) 50 MG 24 hr tablet, Take 50 mg by mouth daily., Disp: , Rfl:  .  nitroGLYCERIN (NITROSTAT) 0.4 MG SL tablet, SMARTSIG:1 Sublingual 4-5 Times Daily, Disp: , Rfl:  .  oxybutynin (DITROPAN) 5 MG tablet, , Disp: , Rfl:  .  pregabalin (LYRICA) 50 MG capsule, Take 1 capsule (50 mg total) by mouth 3 (three) times daily., Disp: 90 capsule, Rfl: 2 .  pregabalin (LYRICA) 50 MG capsule, Take 50 mg by mouth 3 (three) times daily. (Patient not taking: Reported on 06/15/2020), Disp: , Rfl:  .  traMADol (ULTRAM) 50 MG tablet, Take 1-2 tablets (50-100 mg total) by mouth at bedtime. Must last 30 days, Disp: 60 tablet, Rfl: 2 .  traMADol (ULTRAM) 50 MG tablet, TAKE 1 2 TABLETS (50 100 MG TOTAL) BY MOUTH AT BEDTIME AS  NEEDED FOR SEVERE PAIN. MUST LAST 30 DAYS (Patient not taking: Reported on 06/15/2020), Disp: , Rfl:  .  Vitamin D, Ergocalciferol, (DRISDOL) 1.25 MG (50000 UNIT) CAPS capsule, Take 50,000 Units by mouth 2 (two) times a week. (Patient not taking: Reported on 06/15/2020), Disp: , Rfl:   Past Medical History: Past Medical History:  Diagnosis Date  . Abdominal pain   . Acid reflux 01/04/2014  . Acute respiratory failure with hypoxemia ([place]) 09/20/2014  . Anemia   . Anxiety   . Arthritis   . Asthma   . Benign neoplasm of sigmoid colon   . Billowing mitral valve   . Calculus of kidney 02/07/2013  . Complication of anesthesia    pneumonia after anesthesia  . COPD (chronic obstructive pulmonary disease) ([place])   . Depression   . Diabetes mellitus without complication ([place])   . Diverticulitis   . Essential (primary) hypertension 01/10/2015  . GERD (gastroesophageal reflux disease)   . Hypertension   . Incomplete emptying of bladder 07/05/2014  . Kidney stones   . Neuropathy   . PVC (premature ventricular contraction)   . Sciatica of left side   . Septic shock ([place]) 2015  . Sigmoid diverticulitis 01/27/2017  . Sleep apnea    CPAP  . Tachycardia 04/17/2014  . Thrombocytopenia ([place]) 09/20/2014    Tobacco Use: Social History   Tobacco Use  Smoking Status Former Smoker  . Quit date: 01/27/2000  . Years since quitting: 20.5  Smokeless Tobacco Never Used    Labs: Recent Review Scientist, physiological    Labs for ITP Cardiac and Pulmonary Rehab Latest Ref Rng & Units 09/21/2017 09/22/2017   Trlycerides <150 mg/dL - 231(H)   PHART 7.35 - 7.45 7.28(L) -   PCO2ART 32 - 48 mmHg 52(H) -   HCO3 20.0 - 28.0 mmol/L 24.4 -   ACIDBASEDEF 0.0 - 2.0 mmol/L 3.1(H) -   O2SAT % 91.4 -       Exercise Target Goals: Exercise Program Goal: Individual exercise prescription set using results from initial 6 min walk test and THRR while considering  patient's activity barriers and safety.   Exercise Prescription  Goal: Initial exercise prescription builds to 30-45 minutes a day of aerobic activity, 2-3 days per week.  Home exercise guidelines will be given to patient during program as part of exercise prescription that the participant will acknowledge.   Education: Aerobic Exercise: - Group verbal and visual presentation on the components of exercise prescription. Introduces F.I.T.T principle from ACSM for exercise prescriptions.  Reviews F.I.T.T. principles of aerobic exercise including progression. Written material given at graduation.   Education: Resistance Exercise: - Group verbal and visual presentation on the components of exercise prescription. Introduces F.I.T.T principle from ACSM for exercise prescriptions  Reviews F.I.T.T. principles  of resistance exercise including progression. Written material given at graduation.    Education: Exercise & Equipment Safety: - Individual verbal instruction and demonstration of equipment use and safety with use of the equipment.   Cardiac Rehab from 06/28/2020 in [facility]  Date 06/28/20  Educator AS  Instruction Review Code 1- Verbalizes Understanding      Education: Exercise Physiology & General Exercise Guidelines: - Group verbal and written instruction with models to review the exercise physiology of the cardiovascular system and associated critical values. Provides general exercise guidelines with specific guidelines to those with heart or lung disease.    Education: Flexibility, Balance, Mind/Body Relaxation: - Group verbal and visual presentation with interactive activity on the components of exercise prescription. Introduces F.I.T.T principle from ACSM for exercise prescriptions. Reviews F.I.T.T. principles of flexibility and balance exercise training including progression. Also discusses the mind body connection.  Reviews various relaxation techniques to help reduce and manage stress (i.e. Deep breathing, progressive muscle  relaxation, and visualization). Balance handout provided to take home. Written material given at graduation.   Activity Barriers & Risk Stratification:  Activity Barriers & Cardiac Risk Stratification - 06/15/20 1403      Activity Barriers & Cardiac Risk Stratification   Activity Barriers Other (comment)    Comments neuropathy in both legs    Cardiac Risk Stratification High           6 Minute Walk:  6 Minute Walk    Row Name 06/28/20 1640         6 Minute Walk   Phase Initial     Distance 1615 feet     Walk Time 6 minutes     # of Rest Breaks 0     MPH 3     METS 4.25     RPE 15     Perceived Dyspnea  3     VO2 Peak 14.86     Symptoms Yes (comment)     Comments hip pain 5/10     Resting HR 93 bpm     Resting BP 108/64     Resting Oxygen Saturation  97 %     Exercise Oxygen Saturation  during 6 min walk 96 %     Max Ex. HR 132 bpm     Max Ex. BP 152/76     2 Minute Post BP 116/66            Oxygen Initial Assessment:   Oxygen Re-Evaluation:   Oxygen Discharge (Final Oxygen Re-Evaluation):   Initial Exercise Prescription:  Initial Exercise Prescription - 06/28/20 1600      Date of Initial Exercise RX and Referring Provider   Date 06/28/20    Referring Provider Tamala Julian      Recumbant Bike   Level 4    RPM 60    Watts 76    Minutes 15    METs 4.25      NuStep   Level 4    SPM 80    Minutes 15    METs 4.25      REL-XR   Level 6    Watts 76    Speed 50    Minutes 15    METs 4.25      T5 Nustep   Level 3    SPM 80    Minutes 15    METs 4.25      Biostep-RELP   Level 4    SPM 50  Minutes 15    METs 4      Prescription Details   Frequency (times per week) 3    Duration Progress to 30 minutes of continuous aerobic without signs/symptoms of physical distress      Intensity   THRR 40-80% of Max Heartrate 119-147    Ratings of Perceived Exertion 11-15    Perceived Dyspnea 0-4      Resistance Training   Training Prescription  Yes    Weight 5 lb    Reps 10-15           Perform Capillary Blood Glucose checks as needed.  Exercise Prescription Changes:  Exercise Prescription Changes    Row Name 06/28/20 1600 07/11/20 1500           Response to Exercise   Blood Pressure (Admit) 108/64 104/60      Blood Pressure (Exercise) 152/76 130/64      Blood Pressure (Exit) 116/66 140/80      Heart Rate (Admit) 93 bpm 58 bpm      Heart Rate (Exercise) 135 bpm 117 bpm      Heart Rate (Exit) 99 bpm 77 bpm      Oxygen Saturation (Admit) 97 % --      Oxygen Saturation (Exercise) 96 % --      Rating of Perceived Exertion (Exercise) 15 13      Perceived Dyspnea (Exercise) 3 --      Symptoms hip pain 5/10 none      Comments -- second day      Duration -- Progress to 30 minutes of  aerobic without signs/symptoms of physical distress      Intensity -- THRR unchanged        Progression   Progression -- Continue to progress workloads to maintain intensity without signs/symptoms of physical distress.      Average METs -- 3        Resistance Training   Training Prescription -- Yes      Weight -- 3 lb      Reps -- 10-15        NuStep   Level -- 4      SPM -- 80      Minutes -- 15      METs -- 3             Exercise Comments:   Exercise Goals and Review:  Exercise Goals    Row Name 06/28/20 1645             Exercise Goals   Increase Physical Activity Yes       Intervention Provide advice, education, support and counseling about physical activity/exercise needs.;Develop an individualized exercise prescription for aerobic and resistive training based on initial evaluation findings, risk stratification, comorbidities and participant's personal goals.       Expected Outcomes Short Term: Attend rehab on a regular basis to increase amount of physical activity.;Long Term: Add in home exercise to make exercise part of routine and to increase amount of physical activity.;Long Term: Exercising regularly at least  3-5 days a week.       Increase Strength and Stamina Yes       Intervention Provide advice, education, support and counseling about physical activity/exercise needs.;Develop an individualized exercise prescription for aerobic and resistive training based on initial evaluation findings, risk stratification, comorbidities and participant's personal goals.       Expected Outcomes Short Term: Increase workloads from initial exercise prescription for resistance, speed, and METs.;Short  Term: Perform resistance training exercises routinely during rehab and add in resistance training at home;Long Term: Improve cardiorespiratory fitness, muscular endurance and strength as measured by increased METs and functional capacity (6MWT)       Able to understand and use rate of perceived exertion (RPE) scale Yes       Intervention Provide education and explanation on how to use RPE scale       Expected Outcomes Short Term: Able to use RPE daily in rehab to express subjective intensity level;Long Term:  Able to use RPE to guide intensity level when exercising independently       Able to understand and use Dyspnea scale Yes       Intervention Provide education and explanation on how to use Dyspnea scale       Expected Outcomes Short Term: Able to use Dyspnea scale daily in rehab to express subjective sense of shortness of breath during exertion;Long Term: Able to use Dyspnea scale to guide intensity level when exercising independently       Knowledge and understanding of Target Heart Rate Range (THRR) Yes       Intervention Provide education and explanation of THRR including how the numbers were predicted and where they are located for reference       Expected Outcomes Short Term: Able to state/look up THRR;Short Term: Able to use daily as guideline for intensity in rehab;Long Term: Able to use THRR to govern intensity when exercising independently       Able to check pulse independently Yes       Intervention Provide  education and demonstration on how to check pulse in carotid and radial arteries.;Review the importance of being able to check your own pulse for safety during independent exercise       Expected Outcomes Short Term: Able to explain why pulse checking is important during independent exercise;Long Term: Able to check pulse independently and accurately       Understanding of Exercise Prescription Yes       Intervention Provide education, explanation, and written materials on patient's individual exercise prescription       Expected Outcomes Short Term: Able to explain program exercise prescription;Long Term: Able to explain home exercise prescription to exercise independently              Exercise Goals Re-Evaluation :  Exercise Goals Re-Evaluation    Row Name 07/02/20 1635 07/11/20 1514           Exercise Goal Re-Evaluation   Exercise Goals Review Increase Physical Activity;Able to understand and use rate of perceived exertion (RPE) scale;Knowledge and understanding of Target Heart Rate Range (THRR);Understanding of Exercise Prescription;Increase Strength and Stamina;Able to check pulse independently Increase Physical Activity;Increase Strength and Stamina      Comments Reviewed RPE and dyspnea scales, THR and program prescription with pt today.  Pt voiced understanding and was given a copy of goals to take home. tammy has completed 2 exercise sessions. Staff will monitor progress.      Expected Outcomes Short: Use RPE daily to regulate intensity. Long: Follow program prescription in THR. Short: attend consistently Long: build overall stamina             Discharge Exercise Prescription (Final Exercise Prescription Changes):  Exercise Prescription Changes - 07/11/20 1500      Response to Exercise   Blood Pressure (Admit) 104/60    Blood Pressure (Exercise) 130/64    Blood Pressure (Exit) 140/80    Heart Rate (Admit)  58 bpm    Heart Rate (Exercise) 117 bpm    Heart Rate (Exit) 77 bpm     Rating of Perceived Exertion (Exercise) 13    Symptoms none    Comments second day    Duration Progress to 30 minutes of  aerobic without signs/symptoms of physical distress    Intensity THRR unchanged      Progression   Progression Continue to progress workloads to maintain intensity without signs/symptoms of physical distress.    Average METs 3      Resistance Training   Training Prescription Yes    Weight 3 lb    Reps 10-15      NuStep   Level 4    SPM 80    Minutes 15    METs 3           Nutrition:  Target Goals: Understanding of nutrition guidelines, daily intake of sodium 1500mg , cholesterol 200mg , calories 30% from fat and 7% or less from saturated fats, daily to have 5 or more servings of fruits and vegetables.  Education: All About Nutrition: -Group instruction provided by verbal, written material, interactive activities, discussions, models, and posters to present general guidelines for heart healthy nutrition including fat, fiber, MyPlate, the role of sodium in heart healthy nutrition, utilization of the nutrition label, and utilization of this knowledge for meal planning. Follow up email sent as well. Written material given at graduation.   Biometrics:  Pre Biometrics - 06/28/20 1646      Pre Biometrics   Height 5' 10.5" (1.791 m)    Weight 234 lb 4.8 oz (106.3 kg)    BMI (Calculated) 33.13            Nutrition Therapy Plan and Nutrition Goals:   Nutrition Assessments:  Nutrition Assessments - 06/28/20 1647      MEDFICTS Scores   Pre Score 24          MEDIFICTS Score Key:  ?70 Need to make dietary changes   40-70 Heart Healthy Diet  ? 40 Therapeutic Level Cholesterol Diet   Picture Your Plate Scores:  <37 Unhealthy dietary pattern with much room for improvement.  41-50 Dietary pattern unlikely to meet recommendations for good health and room for improvement.  51-60 More healthful dietary pattern, with some room for improvement.    >60 Healthy dietary pattern, although there may be some specific behaviors that could be improved.    Nutrition Goals Re-Evaluation:   Nutrition Goals Discharge (Final Nutrition Goals Re-Evaluation):   Psychosocial: Target Goals: Acknowledge presence or absence of significant depression and/or stress, maximize coping skills, provide positive support system. Participant is able to verbalize types and ability to use techniques and skills needed for reducing stress and depression.   Education: Stress, Anxiety, and Depression - Group verbal and visual presentation to define topics covered.  Reviews how body is impacted by stress, anxiety, and depression.  Also discusses healthy ways to reduce stress and to treat/manage anxiety and depression.  Written material given at graduation.   Education: Sleep Hygiene -Provides group verbal and written instruction about how sleep can affect your health.  Define sleep hygiene, discuss sleep cycles and impact of sleep habits. Review good sleep hygiene tips.    Initial Review & Psychosocial Screening:  Initial Psych Review & Screening - 06/15/20 1408      Initial Review   Current issues with Current Stress Concerns;Current Sleep Concerns    Source of Stress Concerns Family  Family Dynamics   Good Support System? Yes      Barriers   Psychosocial barriers to participate in program There are no identifiable barriers or psychosocial needs.;The patient should benefit from training in stress management and relaxation.      Screening Interventions   Interventions Encouraged to exercise;To provide support and resources with identified psychosocial needs;Provide feedback about the scores to participant    Expected Outcomes Short Term goal: Utilizing psychosocial counselor, staff and physician to assist with identification of specific Stressors or current issues interfering with healing process. Setting desired goal for each stressor or current issue  identified.;Long Term Goal: Stressors or current issues are controlled or eliminated.;Short Term goal: Identification and review with participant of any Quality of Life or Depression concerns found by scoring the questionnaire.;Long Term goal: The participant improves quality of Life and PHQ9 Scores as seen by post scores and/or verbalization of changes           Quality of Life Scores:   Quality of Life - 06/28/20 1648      Quality of Life   Select Quality of Life      Quality of Life Scores   Health/Function Pre 20.73 %    Socioeconomic Pre 19.25 %    Psych/Spiritual Pre 28.29 %    Family Pre 19.7 %    GLOBAL Pre 21.91 %          Scores of 19 and below usually indicate a poorer quality of life in these areas.  A difference of  2-3 points is a clinically meaningful difference.  A difference of 2-3 points in the total score of the Quality of Life Index has been associated with significant improvement in overall quality of life, self-image, physical symptoms, and general health in studies assessing change in quality of life.  PHQ-9: Recent Review Flowsheet Data    Depression screen [facility] 2/9 06/28/2020 09/30/2019   Decreased Interest 0 0   Down, Depressed, Hopeless 0 0   PHQ - 2 Score 0 0   Altered sleeping 1 -   Tired, decreased energy 1 -   Change in appetite 2 -   Feeling bad or failure about yourself  0 -   Trouble concentrating 0 -   Moving slowly or fidgety/restless 0 -   Suicidal thoughts 0 -   PHQ-9 Score 4 -     Interpretation of Total Score  Total Score Depression Severity:  1-4 = Minimal depression, 5-9 = Mild depression, 10-14 = Moderate depression, 15-19 = Moderately severe depression, 20-27 = Severe depression   Psychosocial Evaluation and Intervention:  Psychosocial Evaluation - 06/15/20 1412      Psychosocial Evaluation & Interventions   Interventions Stress management education;Encouraged to exercise with the program and follow exercise  prescription;Relaxation education    Comments 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 The dogs keep her very busy but she enjoys it. She states her husband of 40+ years causes some stress, but she stands her ground. She struggles with neuropathy in both legs which causes a great deal of pain. She has a hard time going to sleep because of the pain. She is hoping this will help with her balance concerns and stamina.    Expected Outcomes Short: attend cardiac rehab for education and exercise. Long: develop positive self care habits.    Continue Psychosocial Services  Follow up required by staff           Psychosocial Re-Evaluation:   Psychosocial Discharge (Final  Psychosocial Re-Evaluation):   Vocational Rehabilitation: Provide vocational rehab assistance to qualifying candidates.   Vocational Rehab Evaluation & Intervention:  Vocational Rehab - 06/15/20 1408      Initial Vocational Rehab Evaluation & Intervention   Assessment shows need for Vocational Rehabilitation No           Education: Education Goals: Education classes will be provided on a variety of topics geared toward better understanding of heart health and risk factor modification. Participant will state understanding/return demonstration of topics presented as noted by education test scores.  Learning Barriers/Preferences:  Learning Barriers/Preferences - 06/15/20 1408      Learning Barriers/Preferences   Learning Barriers None    Learning Preferences None           General Cardiac Education Topics:  AED/CPR: - Group verbal and written instruction with the use of models to demonstrate the basic use of the AED with the basic ABC's of resuscitation.   Anatomy and Cardiac Procedures: - Group verbal and visual presentation and models provide information about basic cardiac anatomy and function. Reviews the testing methods done to diagnose heart disease and the outcomes of the test results. Describes the treatment  choices: Medical Management, Angioplasty, or Coronary Bypass Surgery for treating various heart conditions including Myocardial Infarction, Angina, Valve Disease, and Cardiac Arrhythmias.  Written material given at graduation.   Medication Safety: - Group verbal and visual instruction to review commonly prescribed medications for heart and lung disease. Reviews the medication, class of the drug, and side effects. Includes the steps to properly store meds and maintain the prescription regimen.  Written material given at graduation.   Intimacy: - Group verbal instruction through game format to discuss how heart and lung disease can affect sexual intimacy. Written material given at graduation..   Know Your Numbers and Heart Failure: - Group verbal and visual instruction to discuss disease risk factors for cardiac and pulmonary disease and treatment options.  Reviews associated critical values for Overweight/Obesity, Hypertension, Cholesterol, and Diabetes.  Discusses basics of heart failure: signs/symptoms and treatments.  Introduces Heart Failure Zone chart for action plan for heart failure.  Written material given at graduation.   Infection Prevention: - Provides verbal and written material to individual with discussion of infection control including proper hand washing and proper equipment cleaning during exercise session.   Cardiac Rehab from 06/28/2020 in [facility] Cardiac and Pulmonary Rehab  Date 06/28/20  Educator AS  Instruction Review Code 1- Verbalizes Understanding      Falls Prevention: - Provides verbal and written material to individual with discussion of falls prevention and safety.   Cardiac Rehab from 06/28/2020 in [facility] Cardiac and Pulmonary Rehab  Date 06/28/20  Educator AS  Instruction Review Code 1- Verbalizes Understanding      Other: -Provides group and verbal instruction on various topics (see comments)   Knowledge Questionnaire Score:  Knowledge Questionnaire  Score - 06/28/20 1648      Knowledge Questionnaire Score   Pre Score 23/26           Core Components/Risk Factors/Patient Goals at Admission:  Personal Goals and Risk Factors at Admission - 06/28/20 1647      Core Components/Risk Factors/Patient Goals on Admission    Weight Management Yes    Intervention Weight Management: Develop a combined nutrition and exercise program designed to reach desired caloric intake, while maintaining appropriate intake of nutrient and fiber, sodium and fats, and appropriate energy expenditure required for the weight goal.    Admit  Weight 234 lb 4.8 oz (106.3 kg)    Goal Weight: Short Term 230 lb (104.3 kg)    Goal Weight: Long Term 225 lb (102.1 kg)    Expected Outcomes Short Term: Continue to assess and modify interventions until short term weight is achieved;Long Term: Adherence to nutrition and physical activity/exercise program aimed toward attainment of established weight goal;Understanding recommendations for meals to include 15-35% energy as protein, 25-35% energy from fat, 35-60% energy from carbohydrates, less than 200mg  of dietary cholesterol, 20-35 gm of total fiber daily;Understanding of distribution of calorie intake throughout the day with the consumption of 4-5 meals/snacks    Diabetes Yes    Intervention Provide education about signs/symptoms and action to take for hypo/hyperglycemia.;Provide education about proper nutrition, including hydration, and aerobic/resistive exercise prescription along with prescribed medications to achieve blood glucose in normal ranges: Fasting glucose 65-99 mg/dL    Expected Outcomes Short Term: Participant verbalizes understanding of the signs/symptoms and immediate care of hyper/hypoglycemia, proper foot care and importance of medication, aerobic/resistive exercise and nutrition plan for blood glucose control.;Long Term: Attainment of HbA1C < 7%.    Hypertension Yes    Intervention Provide education on lifestyle  modifcations including regular physical activity/exercise, weight management, moderate sodium restriction and increased consumption of fresh fruit, vegetables, and low fat dairy, alcohol moderation, and smoking cessation.;Monitor prescription use compliance.    Expected Outcomes Short Term: Continued assessment and intervention until BP is < 140/60mm HG in hypertensive participants. < 130/11mm HG in hypertensive participants with diabetes, heart failure or chronic kidney disease.;Long Term: Maintenance of blood pressure at goal levels.    Lipids Yes    Intervention Provide education and support for participant on nutrition & aerobic/resistive exercise along with prescribed medications to achieve LDL 70mg , HDL >40mg .    Expected Outcomes Short Term: Participant states understanding of desired cholesterol values and is compliant with medications prescribed. Participant is following exercise prescription and nutrition guidelines.;Long Term: Cholesterol controlled with medications as prescribed, with individualized exercise RX and with personalized nutrition plan. Value goals: LDL < 70mg , HDL > 40 mg.           Education:Diabetes - Individual verbal and written instruction to review signs/symptoms of diabetes, desired ranges of glucose level fasting, after meals and with exercise. Acknowledge that pre and post exercise glucose checks will be done for 3 sessions at entry of program.   Cardiac Rehab from 06/15/2020 in [facility] Cardiac and Pulmonary Rehab  Date 06/15/20  Educator [facility]  Instruction Review Code 1- Verbalizes Understanding      Core Components/Risk Factors/Patient Goals Review:    Core Components/Risk Factors/Patient Goals at Discharge (Final Review):    ITP Comments:  ITP Comments    Row Name 06/15/20 1418 07/02/20 1634 07/25/20 0920       ITP Comments Initial telephone orientation completed. Diagnosis can be found in [facility] 10/8. EP orientation scheduled for Thursday 11/4 at 3:30.  First full day of exercise!  Patient was oriented to gym and equipment including functions, settings, policies, and procedures.  Patient's individual exercise prescription and treatment plan were reviewed.  All starting workloads were established based on the results of the 6 minute walk test done at initial orientation visit.  The plan for exercise progression was also introduced and progression will be customized based on patient's performance and goals. 30 Day review completed. Medical Director ITP review done, changes made as directed, and signed approval by Medical Director. New to program Last visit 11/11  Comments:

## 2020-07-30 ENCOUNTER — Encounter: Payer: BC Managed Care – PPO | Attending: Cardiovascular Disease

## 2020-07-30 ENCOUNTER — Telehealth: Payer: Self-pay

## 2020-07-30 DIAGNOSIS — Z955 Presence of coronary angioplasty implant and graft: Secondary | ICD-10-CM | POA: Insufficient documentation

## 2020-07-30 NOTE — Telephone Encounter (Signed)
Called to check in on how Grace Hester is doing as she has not been to rehab since 07/05/20. She was trying to straighten out her medication and told her I would check up 12/6 (2 weeks). Her mailbox was full. Will try again.

## 2020-08-01 ENCOUNTER — Telehealth: Payer: Self-pay

## 2020-08-01 NOTE — Telephone Encounter (Signed)
Called Grace Hester again to check in as her voicemail box was full Monday. Her voicemail box was still full so I was unable to leave a message.

## 2020-08-06 ENCOUNTER — Telehealth: Payer: Self-pay

## 2020-08-06 NOTE — Telephone Encounter (Signed)
Called Grace Hester as we have not seen her back in rehab or heard from her. Have tried previously to contact, but mailbox has been full. Mailbox is still full. Will send a letter at this time.

## 2020-08-22 ENCOUNTER — Encounter: Payer: Self-pay | Admitting: *Deleted

## 2020-08-22 DIAGNOSIS — Z955 Presence of coronary angioplasty implant and graft: Secondary | ICD-10-CM

## 2020-08-22 NOTE — Progress Notes (Signed)
Cardiac Individual Treatment Plan  Patient Details  Name: Grace Hester MRN: [id number] Date of Birth: [birth year] Referring Provider:   Flowsheet Row Cardiac Rehab from 06/28/2020 in [facility] Cardiac and Pulmonary Rehab  Referring Provider Tamala Julian      Initial Encounter Date:  Flowsheet Row Cardiac Rehab from 06/28/2020 in [facility] Cardiac and Pulmonary Rehab  Date 06/28/20      Visit Diagnosis: Status post coronary artery stent placement  Patient's Home Medications on Admission:  Current Outpatient Medications:  .  albuterol (PROVENTIL HFA;VENTOLIN HFA) 108 (90 Base) MCG/ACT inhaler, Inhale 1-2 puffs into the lungs every 6 (six) hours as needed for wheezing or shortness of breath. (Patient not taking: Reported on 06/15/2020), Disp: 1 Inhaler, Rfl: 0 .  albuterol (VENTOLIN HFA) 108 (90 Base) MCG/ACT inhaler, , Disp: , Rfl:  .  amitriptyline (ELAVIL) 10 MG tablet, , Disp: , Rfl:  .  atorvastatin (LIPITOR) 40 MG tablet, Take 40 mg by mouth at bedtime., Disp: , Rfl:  .  busPIRone (BUSPAR) 10 MG tablet, Take 10 mg by mouth 2 (two) times daily. 2 tabs twice per day, Disp: , Rfl:  .  busPIRone (BUSPAR) 10 MG tablet, Take 20 mg by mouth 2 (two) times daily., Disp: , Rfl:  .  calcium carbonate (CALCIUM 600) 600 MG TABS tablet, Take 2 tablets (1,200 mg total) by mouth daily with breakfast., Disp: 60 tablet, Rfl: 5 .  chlorpheniramine-HYDROcodone (TUSSIONEX PENNKINETIC ER) 10-8 MG/5ML SUER, Take 5 mLs by mouth every 12 (twelve) hours as needed for cough. Will causes drowsiness; NO DRIVING. (Patient not taking: Reported on 06/15/2020), Disp: 70 mL, Rfl: 0 .  clobetasol cream (TEMOVATE) 0.05 %, APPLY TO AFFECTED AREA TWICE A DAY (Patient not taking: Reported on 06/15/2020), Disp: , Rfl:  .  clopidogrel (PLAVIX) 75 MG tablet, Take 75 mg by mouth daily., Disp: , Rfl:  .  [employer] D3 125 MCG (5000 UT) capsule, Take 5,000 Units by mouth every morning. (Patient not taking: Reported on 06/15/2020), Disp: , Rfl:  .   doxepin (SINEQUAN) 50 MG capsule, Take 50 mg by mouth at bedtime., Disp: , Rfl:  .  DULoxetine (CYMBALTA) 60 MG capsule, Take 120 mg by mouth daily. (Patient not taking: Reported on 06/15/2020), Disp: , Rfl:  .  esomeprazole (NEXIUM) 20 MG capsule, Take 20 mg by mouth every morning. , Disp: , Rfl:  .  EUCRISA 2 % OINT, Apply 1 application topically at bedtime.  (Patient not taking: Reported on 06/15/2020), Disp: , Rfl: 1 .  fexofenadine (ALLEGRA) 180 MG tablet, Take 180 mg by mouth daily. , Disp: , Rfl:  .  hydrochlorothiazide (HYDRODIURIL) 12.5 MG tablet, Take 12.5 mg by mouth daily.  (Patient not taking: Reported on 06/15/2020), Disp: , Rfl:  .  hydrochlorothiazide (MICROZIDE) 12.5 MG capsule, Take 12.5-25 mg by mouth daily as needed. (Patient not taking: Reported on 06/15/2020), Disp: , Rfl:  .  isosorbide mononitrate (IMDUR) 30 MG 24 hr tablet, Take 30 mg by mouth daily., Disp: , Rfl:  .  levocetirizine (XYZAL) 5 MG tablet, Take 5 mg by mouth at bedtime., Disp: , Rfl:  .  losartan (COZAAR) 25 MG tablet, Take 25 mg by mouth daily., Disp: , Rfl:  .  metFORMIN (GLUCOPHAGE) 500 MG tablet, Take 500 mg by mouth 2 (two) times daily. (Patient not taking: Reported on 06/15/2020), Disp: , Rfl:  .  metoprolol succinate (TOPROL-XL) 50 MG 24 hr tablet, Take 50 mg by mouth daily., Disp: , Rfl:  .  nitroGLYCERIN (NITROSTAT) 0.4 MG SL tablet, SMARTSIG:1 Sublingual 4-5 Times Daily, Disp: , Rfl:  .  oxybutynin (DITROPAN) 5 MG tablet, , Disp: , Rfl:  .  pregabalin (LYRICA) 50 MG capsule, Take 1 capsule (50 mg total) by mouth 3 (three) times daily., Disp: 90 capsule, Rfl: 2 .  pregabalin (LYRICA) 50 MG capsule, Take 50 mg by mouth 3 (three) times daily. (Patient not taking: Reported on 06/15/2020), Disp: , Rfl:  .  traMADol (ULTRAM) 50 MG tablet, Take 1-2 tablets (50-100 mg total) by mouth at bedtime. Must last 30 days, Disp: 60 tablet, Rfl: 2 .  traMADol (ULTRAM) 50 MG tablet, TAKE 1 2 TABLETS (50 100 MG TOTAL)  BY MOUTH AT BEDTIME AS NEEDED FOR SEVERE PAIN. MUST LAST 30 DAYS (Patient not taking: Reported on 06/15/2020), Disp: , Rfl:  .  Vitamin D, Ergocalciferol, (DRISDOL) 1.25 MG (50000 UNIT) CAPS capsule, Take 50,000 Units by mouth 2 (two) times a week. (Patient not taking: Reported on 06/15/2020), Disp: , Rfl:   Past Medical History: Past Medical History:  Diagnosis Date  . Abdominal pain   . Acid reflux 01/04/2014  . Acute respiratory failure with hypoxemia ([place]) 09/20/2014  . Anemia   . Anxiety   . Arthritis   . Asthma   . Benign neoplasm of sigmoid colon   . Billowing mitral valve   . Calculus of kidney 02/07/2013  . Complication of anesthesia    pneumonia after anesthesia  . COPD (chronic obstructive pulmonary disease) ([place])   . Depression   . Diabetes mellitus without complication ([place])   . Diverticulitis   . Essential (primary) hypertension 01/10/2015  . GERD (gastroesophageal reflux disease)   . Hypertension   . Incomplete emptying of bladder 07/05/2014  . Kidney stones   . Neuropathy   . PVC (premature ventricular contraction)   . Sciatica of left side   . Septic shock ([place]) 2015  . Sigmoid diverticulitis 01/27/2017  . Sleep apnea    CPAP  . Tachycardia 04/17/2014  . Thrombocytopenia ([place]) 09/20/2014    Tobacco Use: Social History   Tobacco Use  Smoking Status Former Smoker  . Quit date: 01/27/2000  . Years since quitting: 20.5  Smokeless Tobacco Never Used    Labs: Recent Review Scientist, physiological    Labs for ITP Cardiac and Pulmonary Rehab Latest Ref Rng & Units 09/21/2017 09/22/2017   Trlycerides <150 mg/dL - 231(H)   PHART 7.350 - 7.450 7.28(L) -   PCO2ART 32.0 - 48.0 mmHg 52(H) -   HCO3 20.0 - 28.0 mmol/L 24.4 -   ACIDBASEDEF 0.0 - 2.0 mmol/L 3.1(H) -   O2SAT % 91.4 -       Exercise Target Goals: Exercise Program Goal: Individual exercise prescription set using results from initial 6 min walk test and THRR while considering  patient's activity barriers and  safety.   Exercise Prescription Goal: Initial exercise prescription builds to 30-45 minutes a day of aerobic activity, 2-3 days per week.  Home exercise guidelines will be given to patient during program as part of exercise prescription that the participant will acknowledge.   Education: Aerobic Exercise: - Group verbal and visual presentation on the components of exercise prescription. Introduces F.I.T.T principle from ACSM for exercise prescriptions.  Reviews F.I.T.T. principles of aerobic exercise including progression. Written material given at graduation.   Education: Resistance Exercise: - Group verbal and visual presentation on the components of exercise prescription. Introduces F.I.T.T principle from ACSM for exercise prescriptions  Reviews F.I.T.T. principles  of resistance exercise including progression. Written material given at graduation.    Education: Exercise & Equipment Safety: - Individual verbal instruction and demonstration of equipment use and safety with use of the equipment. Flowsheet Row Cardiac Rehab from 06/28/2020 in [facility] Cardiac and Pulmonary Rehab  Date 06/28/20  Educator AS  Instruction Review Code 1- Verbalizes Understanding      Education: Exercise Physiology & General Exercise Guidelines: - Group verbal and written instruction with models to review the exercise physiology of the cardiovascular system and associated critical values. Provides general exercise guidelines with specific guidelines to those with heart or lung disease.    Education: Flexibility, Balance, Mind/Body Relaxation: - Group verbal and visual presentation with interactive activity on the components of exercise prescription. Introduces F.I.T.T principle from ACSM for exercise prescriptions. Reviews F.I.T.T. principles of flexibility and balance exercise training including progression. Also discusses the mind body connection.  Reviews various relaxation techniques to help reduce and manage  stress (i.e. Deep breathing, progressive muscle relaxation, and visualization). Balance handout provided to take home. Written material given at graduation.   Activity Barriers & Risk Stratification:  Activity Barriers & Cardiac Risk Stratification - 06/15/20 1403      Activity Barriers & Cardiac Risk Stratification   Activity Barriers Other (comment)    Comments neuropathy in both legs    Cardiac Risk Stratification High           6 Minute Walk:  6 Minute Walk    Row Name 06/28/20 1640         6 Minute Walk   Phase Initial     Distance 1615 feet     Walk Time 6 minutes     # of Rest Breaks 0     MPH 3     METS 4.25     RPE 15     Perceived Dyspnea  3     VO2 Peak 14.86     Symptoms Yes (comment)     Comments hip pain 5/10     Resting HR 93 bpm     Resting BP 108/64     Resting Oxygen Saturation  97 %     Exercise Oxygen Saturation  during 6 min walk 96 %     Max Ex. HR 132 bpm     Max Ex. BP 152/76     2 Minute Post BP 116/66            Oxygen Initial Assessment:   Oxygen Re-Evaluation:   Oxygen Discharge (Final Oxygen Re-Evaluation):   Initial Exercise Prescription:  Initial Exercise Prescription - 06/28/20 1600      Date of Initial Exercise RX and Referring Provider   Date 06/28/20    Referring Provider Tamala Julian      Recumbant Bike   Level 4    RPM 60    Watts 76    Minutes 15    METs 4.25      NuStep   Level 4    SPM 80    Minutes 15    METs 4.25      REL-XR   Level 6    Watts 76    Speed 50    Minutes 15    METs 4.25      T5 Nustep   Level 3    SPM 80    Minutes 15    METs 4.25      Biostep-RELP   Level 4    SPM 50  Minutes 15    METs 4      Prescription Details   Frequency (times per week) 3    Duration Progress to 30 minutes of continuous aerobic without signs/symptoms of physical distress      Intensity   THRR 40-80% of Max Heartrate 119-147    Ratings of Perceived Exertion 11-15    Perceived Dyspnea 0-4       Resistance Training   Training Prescription Yes    Weight 5 lb    Reps 10-15           Perform Capillary Blood Glucose checks as needed.  Exercise Prescription Changes:  Exercise Prescription Changes    Row Name 06/28/20 1600 07/11/20 1500           Response to Exercise   Blood Pressure (Admit) 108/64 104/60      Blood Pressure (Exercise) 152/76 130/64      Blood Pressure (Exit) 116/66 140/80      Heart Rate (Admit) 93 bpm 58 bpm      Heart Rate (Exercise) 135 bpm 117 bpm      Heart Rate (Exit) 99 bpm 77 bpm      Oxygen Saturation (Admit) 97 % --      Oxygen Saturation (Exercise) 96 % --      Rating of Perceived Exertion (Exercise) 15 13      Perceived Dyspnea (Exercise) 3 --      Symptoms hip pain 5/10 none      Comments -- second day      Duration -- Progress to 30 minutes of  aerobic without signs/symptoms of physical distress      Intensity -- THRR unchanged             Progression   Progression -- Continue to progress workloads to maintain intensity without signs/symptoms of physical distress.      Average METs -- 3             Resistance Training   Training Prescription -- Yes      Weight -- 3 lb      Reps -- 10-15             NuStep   Level -- 4      SPM -- 80      Minutes -- 15      METs -- 3             Exercise Comments:   Exercise Goals and Review:  Exercise Goals    Row Name 06/28/20 1645             Exercise Goals   Increase Physical Activity Yes       Intervention Provide advice, education, support and counseling about physical activity/exercise needs.;Develop an individualized exercise prescription for aerobic and resistive training based on initial evaluation findings, risk stratification, comorbidities and participant's personal goals.       Expected Outcomes Short Term: Attend rehab on a regular basis to increase amount of physical activity.;Long Term: Add in home exercise to make exercise part of routine and to increase amount  of physical activity.;Long Term: Exercising regularly at least 3-5 days a week.       Increase Strength and Stamina Yes       Intervention Provide advice, education, support and counseling about physical activity/exercise needs.;Develop an individualized exercise prescription for aerobic and resistive training based on initial evaluation findings, risk stratification, comorbidities and participant's personal goals.  Expected Outcomes Short Term: Increase workloads from initial exercise prescription for resistance, speed, and METs.;Short Term: Perform resistance training exercises routinely during rehab and add in resistance training at home;Long Term: Improve cardiorespiratory fitness, muscular endurance and strength as measured by increased METs and functional capacity (6MWT)       Able to understand and use rate of perceived exertion (RPE) scale Yes       Intervention Provide education and explanation on how to use RPE scale       Expected Outcomes Short Term: Able to use RPE daily in rehab to express subjective intensity level;Long Term:  Able to use RPE to guide intensity level when exercising independently       Able to understand and use Dyspnea scale Yes       Intervention Provide education and explanation on how to use Dyspnea scale       Expected Outcomes Short Term: Able to use Dyspnea scale daily in rehab to express subjective sense of shortness of breath during exertion;Long Term: Able to use Dyspnea scale to guide intensity level when exercising independently       Knowledge and understanding of Target Heart Rate Range (THRR) Yes       Intervention Provide education and explanation of THRR including how the numbers were predicted and where they are located for reference       Expected Outcomes Short Term: Able to state/look up THRR;Short Term: Able to use daily as guideline for intensity in rehab;Long Term: Able to use THRR to govern intensity when exercising independently       Able  to check pulse independently Yes       Intervention Provide education and demonstration on how to check pulse in carotid and radial arteries.;Review the importance of being able to check your own pulse for safety during independent exercise       Expected Outcomes Short Term: Able to explain why pulse checking is important during independent exercise;Long Term: Able to check pulse independently and accurately       Understanding of Exercise Prescription Yes       Intervention Provide education, explanation, and written materials on patient's individual exercise prescription       Expected Outcomes Short Term: Able to explain program exercise prescription;Long Term: Able to explain home exercise prescription to exercise independently              Exercise Goals Re-Evaluation :  Exercise Goals Re-Evaluation    Row Name 07/02/20 1635 07/11/20 1514           Exercise Goal Re-Evaluation   Exercise Goals Review Increase Physical Activity;Able to understand and use rate of perceived exertion (RPE) scale;Knowledge and understanding of Target Heart Rate Range (THRR);Understanding of Exercise Prescription;Increase Strength and Stamina;Able to check pulse independently Increase Physical Activity;Increase Strength and Stamina      Comments Reviewed RPE and dyspnea scales, THR and program prescription with pt today.  Pt voiced understanding and was given a copy of goals to take home. tammy has completed 2 exercise sessions. Staff will monitor progress.      Expected Outcomes Short: Use RPE daily to regulate intensity. Long: Follow program prescription in THR. Short: attend consistently Long: build overall stamina             Discharge Exercise Prescription (Final Exercise Prescription Changes):  Exercise Prescription Changes - 07/11/20 1500      Response to Exercise   Blood Pressure (Admit) 104/60    Blood Pressure (  Exercise) 130/64    Blood Pressure (Exit) 140/80    Heart Rate (Admit) 58 bpm     Heart Rate (Exercise) 117 bpm    Heart Rate (Exit) 77 bpm    Rating of Perceived Exertion (Exercise) 13    Symptoms none    Comments second day    Duration Progress to 30 minutes of  aerobic without signs/symptoms of physical distress    Intensity THRR unchanged      Progression   Progression Continue to progress workloads to maintain intensity without signs/symptoms of physical distress.    Average METs 3      Resistance Training   Training Prescription Yes    Weight 3 lb    Reps 10-15      NuStep   Level 4    SPM 80    Minutes 15    METs 3           Nutrition:  Target Goals: Understanding of nutrition guidelines, daily intake of sodium 1500mg , cholesterol 200mg , calories 30% from fat and 7% or less from saturated fats, daily to have 5 or more servings of fruits and vegetables.  Education: All About Nutrition: -Group instruction provided by verbal, written material, interactive activities, discussions, models, and posters to present general guidelines for heart healthy nutrition including fat, fiber, MyPlate, the role of sodium in heart healthy nutrition, utilization of the nutrition label, and utilization of this knowledge for meal planning. Follow up email sent as well. Written material given at graduation.   Biometrics:  Pre Biometrics - 06/28/20 1646      Pre Biometrics   Height 5' 10.5" (1.791 m)    Weight 234 lb 4.8 oz (106.3 kg)    BMI (Calculated) 33.13            Nutrition Therapy Plan and Nutrition Goals:   Nutrition Assessments:  Nutrition Assessments - 06/28/20 1647      MEDFICTS Scores   Pre Score 24          MEDIFICTS Score Key:  ?70 Need to make dietary changes   40-70 Heart Healthy Diet  ? 40 Therapeutic Level Cholesterol Diet   Picture Your Plate Scores:  <81 Unhealthy dietary pattern with much room for improvement.  41-50 Dietary pattern unlikely to meet recommendations for good health and room for improvement.  51-60  More healthful dietary pattern, with some room for improvement.   >60 Healthy dietary pattern, although there may be some specific behaviors that could be improved.    Nutrition Goals Re-Evaluation:   Nutrition Goals Discharge (Final Nutrition Goals Re-Evaluation):   Psychosocial: Target Goals: Acknowledge presence or absence of significant depression and/or stress, maximize coping skills, provide positive support system. Participant is able to verbalize types and ability to use techniques and skills needed for reducing stress and depression.   Education: Stress, Anxiety, and Depression - Group verbal and visual presentation to define topics covered.  Reviews how body is impacted by stress, anxiety, and depression.  Also discusses healthy ways to reduce stress and to treat/manage anxiety and depression.  Written material given at graduation.   Education: Sleep Hygiene -Provides group verbal and written instruction about how sleep can affect your health.  Define sleep hygiene, discuss sleep cycles and impact of sleep habits. Review good sleep hygiene tips.    Initial Review & Psychosocial Screening:  Initial Psych Review & Screening - 06/15/20 1408      Initial Review   Current issues with Current Stress Concerns;Current  Sleep Concerns    Source of Stress Concerns Family      Family Dynamics   Good Support System? Yes      Barriers   Psychosocial barriers to participate in program There are no identifiable barriers or psychosocial needs.;The patient should benefit from training in stress management and relaxation.      Screening Interventions   Interventions Encouraged to exercise;To provide support and resources with identified psychosocial needs;Provide feedback about the scores to participant    Expected Outcomes Short Term goal: Utilizing psychosocial counselor, staff and physician to assist with identification of specific Stressors or current issues interfering with healing  process. Setting desired goal for each stressor or current issue identified.;Long Term Goal: Stressors or current issues are controlled or eliminated.;Short Term goal: Identification and review with participant of any Quality of Life or Depression concerns found by scoring the questionnaire.;Long Term goal: The participant improves quality of Life and PHQ9 Scores as seen by post scores and/or verbalization of changes           Quality of Life Scores:   Quality of Life - 06/28/20 1648      Quality of Life   Select Quality of Life      Quality of Life Scores   Health/Function Pre 20.73 %    Socioeconomic Pre 19.25 %    Psych/Spiritual Pre 28.29 %    Family Pre 19.7 %    GLOBAL Pre 21.91 %          Scores of 19 and below usually indicate a poorer quality of life in these areas.  A difference of  2-3 points is a clinically meaningful difference.  A difference of 2-3 points in the total score of the Quality of Life Index has been associated with significant improvement in overall quality of life, self-image, physical symptoms, and general health in studies assessing change in quality of life.  PHQ-9: Recent Review Flowsheet Data    Depression screen [facility] 2/9 06/28/2020 09/30/2019   Decreased Interest 0 0   Down, Depressed, Hopeless 0 0   PHQ - 2 Score 0 0   Altered sleeping 1 -   Tired, decreased energy 1 -   Change in appetite 2 -   Feeling bad or failure about yourself  0 -   Trouble concentrating 0 -   Moving slowly or fidgety/restless 0 -   Suicidal thoughts 0 -   PHQ-9 Score 4 -     Interpretation of Total Score  Total Score Depression Severity:  1-4 = Minimal depression, 5-9 = Mild depression, 10-14 = Moderate depression, 15-19 = Moderately severe depression, 20-27 = Severe depression   Psychosocial Evaluation and Intervention:  Psychosocial Evaluation - 06/15/20 1412      Psychosocial Evaluation & Interventions   Interventions Stress management education;Encouraged to  exercise with the program and follow exercise prescription;Relaxation education    Comments 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 The dogs keep her very busy but she enjoys it. She states her husband of 40+ years causes some stress, but she stands her ground. She struggles with neuropathy in both legs which causes a great deal of pain. She has a hard time going to sleep because of the pain. She is hoping this will help with her balance concerns and stamina.    Expected Outcomes Short: attend cardiac rehab for education and exercise. Long: develop positive self care habits.    Continue Psychosocial Services  Follow up required by staff  Psychosocial Re-Evaluation:   Psychosocial Discharge (Final Psychosocial Re-Evaluation):   Vocational Rehabilitation: Provide vocational rehab assistance to qualifying candidates.   Vocational Rehab Evaluation & Intervention:  Vocational Rehab - 06/15/20 1408      Initial Vocational Rehab Evaluation & Intervention   Assessment shows need for Vocational Rehabilitation No           Education: Education Goals: Education classes will be provided on a variety of topics geared toward better understanding of heart health and risk factor modification. Participant will state understanding/return demonstration of topics presented as noted by education test scores.  Learning Barriers/Preferences:  Learning Barriers/Preferences - 06/15/20 1408      Learning Barriers/Preferences   Learning Barriers None    Learning Preferences None           General Cardiac Education Topics:  AED/CPR: - Group verbal and written instruction with the use of models to demonstrate the basic use of the AED with the basic ABC's of resuscitation.   Anatomy and Cardiac Procedures: - Group verbal and visual presentation and models provide information about basic cardiac anatomy and function. Reviews the testing methods done to diagnose heart disease and the outcomes  of the test results. Describes the treatment choices: Medical Management, Angioplasty, or Coronary Bypass Surgery for treating various heart conditions including Myocardial Infarction, Angina, Valve Disease, and Cardiac Arrhythmias.  Written material given at graduation.   Medication Safety: - Group verbal and visual instruction to review commonly prescribed medications for heart and lung disease. Reviews the medication, class of the drug, and side effects. Includes the steps to properly store meds and maintain the prescription regimen.  Written material given at graduation.   Intimacy: - Group verbal instruction through game format to discuss how heart and lung disease can affect sexual intimacy. Written material given at graduation..   Know Your Numbers and Heart Failure: - Group verbal and visual instruction to discuss disease risk factors for cardiac and pulmonary disease and treatment options.  Reviews associated critical values for Overweight/Obesity, Hypertension, Cholesterol, and Diabetes.  Discusses basics of heart failure: signs/symptoms and treatments.  Introduces Heart Failure Zone chart for action plan for heart failure.  Written material given at graduation.   Infection Prevention: - Provides verbal and written material to individual with discussion of infection control including proper hand washing and proper equipment cleaning during exercise session. Flowsheet Row Cardiac Rehab from 06/28/2020 in [facility] Cardiac and Pulmonary Rehab  Date 06/28/20  Educator AS  Instruction Review Code 1- Verbalizes Understanding      Falls Prevention: - Provides verbal and written material to individual with discussion of falls prevention and safety. Flowsheet Row Cardiac Rehab from 06/28/2020 in [facility] Cardiac and Pulmonary Rehab  Date 06/28/20  Educator AS  Instruction Review Code 1- Verbalizes Understanding      Other: -Provides group and verbal instruction on various topics (see  comments)   Knowledge Questionnaire Score:  Knowledge Questionnaire Score - 06/28/20 1648      Knowledge Questionnaire Score   Pre Score 23/26           Core Components/Risk Factors/Patient Goals at Admission:  Personal Goals and Risk Factors at Admission - 06/28/20 1647      Core Components/Risk Factors/Patient Goals on Admission    Weight Management Yes    Intervention Weight Management: Develop a combined nutrition and exercise program designed to reach desired caloric intake, while maintaining appropriate intake of nutrient and fiber, sodium and fats, and appropriate energy expenditure required for  the weight goal.    Admit Weight 234 lb 4.8 oz (106.3 kg)    Goal Weight: Short Term 230 lb (104.3 kg)    Goal Weight: Long Term 225 lb (102.1 kg)    Expected Outcomes Short Term: Continue to assess and modify interventions until short term weight is achieved;Long Term: Adherence to nutrition and physical activity/exercise program aimed toward attainment of established weight goal;Understanding recommendations for meals to include 15-35% energy as protein, 25-35% energy from fat, 35-60% energy from carbohydrates, less than 200mg  of dietary cholesterol, 20-35 gm of total fiber daily;Understanding of distribution of calorie intake throughout the day with the consumption of 4-5 meals/snacks    Diabetes Yes    Intervention Provide education about signs/symptoms and action to take for hypo/hyperglycemia.;Provide education about proper nutrition, including hydration, and aerobic/resistive exercise prescription along with prescribed medications to achieve blood glucose in normal ranges: Fasting glucose 65-99 mg/dL    Expected Outcomes Short Term: Participant verbalizes understanding of the signs/symptoms and immediate care of hyper/hypoglycemia, proper foot care and importance of medication, aerobic/resistive exercise and nutrition plan for blood glucose control.;Long Term: Attainment of HbA1C < 7%.     Hypertension Yes    Intervention Provide education on lifestyle modifcations including regular physical activity/exercise, weight management, moderate sodium restriction and increased consumption of fresh fruit, vegetables, and low fat dairy, alcohol moderation, and smoking cessation.;Monitor prescription use compliance.    Expected Outcomes Short Term: Continued assessment and intervention until BP is < 140/17mm HG in hypertensive participants. < 130/82mm HG in hypertensive participants with diabetes, heart failure or chronic kidney disease.;Long Term: Maintenance of blood pressure at goal levels.    Lipids Yes    Intervention Provide education and support for participant on nutrition & aerobic/resistive exercise along with prescribed medications to achieve LDL 70mg , HDL >40mg .    Expected Outcomes Short Term: Participant states understanding of desired cholesterol values and is compliant with medications prescribed. Participant is following exercise prescription and nutrition guidelines.;Long Term: Cholesterol controlled with medications as prescribed, with individualized exercise RX and with personalized nutrition plan. Value goals: LDL < 70mg , HDL > 40 mg.           Education:Diabetes - Individual verbal and written instruction to review signs/symptoms of diabetes, desired ranges of glucose level fasting, after meals and with exercise. Acknowledge that pre and post exercise glucose checks will be done for 3 sessions at entry of program. [place] from 06/15/2020 in [facility] Cardiac and Pulmonary Rehab  Date 06/15/20  Educator [facility]  Instruction Review Code 1- Verbalizes Understanding      Core Components/Risk Factors/Patient Goals Review:    Core Components/Risk Factors/Patient Goals at Discharge (Final Review):    ITP Comments:  ITP Comments    Row Name 06/15/20 1418 07/02/20 1634 07/25/20 0920 08/06/20 1206 08/09/20 0812   ITP Comments Initial telephone orientation  completed. Diagnosis can be found in [facility] 10/8. EP orientation scheduled for Thursday 11/4 at 3:30. First full day of exercise!  Patient was oriented to gym and equipment including functions, settings, policies, and procedures.  Patient's individual exercise prescription and treatment plan were reviewed.  All starting workloads were established based on the results of the 6 minute walk test done at initial orientation visit.  The plan for exercise progression was also introduced and progression will be customized based on patient's performance and goals. 30 Day review completed. Medical Director ITP review done, changes made as directed, and signed approval by Medical Director. New to  program Last visit 11/11 [facility] as we have not seen her back in rehab or heard from her. Have tried previously to contact, but mailbox has been full. Mailbox is still full. Will send a letter at this time. Tammy has not attended since last exercise review.   [place] Name 08/22/20 0537           ITP Comments 30 Day review completed. Medical Director ITP review done, changes made as directed, and signed approval by Medical Director.              Comments:

## 2020-08-23 DIAGNOSIS — Z955 Presence of coronary angioplasty implant and graft: Secondary | ICD-10-CM

## 2020-08-23 NOTE — Progress Notes (Signed)
Cardiac Individual Treatment Plan  Patient Details  Name: Grace Hester MRN: [id number] Date of Birth: [birth year] Referring Provider:   Flowsheet Row Cardiac Rehab from 06/28/2020 in [facility] Cardiac and Pulmonary Rehab  Referring Provider Tamala Julian      Initial Encounter Date:  Flowsheet Row Cardiac Rehab from 06/28/2020 in [facility] Cardiac and Pulmonary Rehab  Date 06/28/20      Visit Diagnosis: Status post coronary artery stent placement  Patient's Home Medications on Admission:  Current Outpatient Medications:  .  albuterol (PROVENTIL HFA;VENTOLIN HFA) 108 (90 Base) MCG/ACT inhaler, Inhale 1-2 puffs into the lungs every 6 (six) hours as needed for wheezing or shortness of breath. (Patient not taking: Reported on 06/15/2020), Disp: 1 Inhaler, Rfl: 0 .  albuterol (VENTOLIN HFA) 108 (90 Base) MCG/ACT inhaler, , Disp: , Rfl:  .  amitriptyline (ELAVIL) 10 MG tablet, , Disp: , Rfl:  .  atorvastatin (LIPITOR) 40 MG tablet, Take 40 mg by mouth at bedtime., Disp: , Rfl:  .  busPIRone (BUSPAR) 10 MG tablet, Take 10 mg by mouth 2 (two) times daily. 2 tabs twice per day, Disp: , Rfl:  .  busPIRone (BUSPAR) 10 MG tablet, Take 20 mg by mouth 2 (two) times daily., Disp: , Rfl:  .  calcium carbonate (CALCIUM 600) 600 MG TABS tablet, Take 2 tablets (1,200 mg total) by mouth daily with breakfast., Disp: 60 tablet, Rfl: 5 .  chlorpheniramine-HYDROcodone (TUSSIONEX PENNKINETIC ER) 10-8 MG/5ML SUER, Take 5 mLs by mouth every 12 (twelve) hours as needed for cough. Will causes drowsiness; NO DRIVING. (Patient not taking: Reported on 06/15/2020), Disp: 70 mL, Rfl: 0 .  clobetasol cream (TEMOVATE) 0.05 %, APPLY TO AFFECTED AREA TWICE A DAY (Patient not taking: Reported on 06/15/2020), Disp: , Rfl:  .  clopidogrel (PLAVIX) 75 MG tablet, Take 75 mg by mouth daily., Disp: , Rfl:  .  [employer] D3 125 MCG (5000 UT) capsule, Take 5,000 Units by mouth every morning. (Patient not taking: Reported on 06/15/2020), Disp: , Rfl:  .   doxepin (SINEQUAN) 50 MG capsule, Take 50 mg by mouth at bedtime., Disp: , Rfl:  .  DULoxetine (CYMBALTA) 60 MG capsule, Take 120 mg by mouth daily. (Patient not taking: Reported on 06/15/2020), Disp: , Rfl:  .  esomeprazole (NEXIUM) 20 MG capsule, Take 20 mg by mouth every morning. , Disp: , Rfl:  .  EUCRISA 2 % OINT, Apply 1 application topically at bedtime.  (Patient not taking: Reported on 06/15/2020), Disp: , Rfl: 1 .  fexofenadine (ALLEGRA) 180 MG tablet, Take 180 mg by mouth daily. , Disp: , Rfl:  .  hydrochlorothiazide (HYDRODIURIL) 12.5 MG tablet, Take 12.5 mg by mouth daily.  (Patient not taking: Reported on 06/15/2020), Disp: , Rfl:  .  hydrochlorothiazide (MICROZIDE) 12.5 MG capsule, Take 12.5-25 mg by mouth daily as needed. (Patient not taking: Reported on 06/15/2020), Disp: , Rfl:  .  isosorbide mononitrate (IMDUR) 30 MG 24 hr tablet, Take 30 mg by mouth daily., Disp: , Rfl:  .  levocetirizine (XYZAL) 5 MG tablet, Take 5 mg by mouth at bedtime., Disp: , Rfl:  .  losartan (COZAAR) 25 MG tablet, Take 25 mg by mouth daily., Disp: , Rfl:  .  metFORMIN (GLUCOPHAGE) 500 MG tablet, Take 500 mg by mouth 2 (two) times daily. (Patient not taking: Reported on 06/15/2020), Disp: , Rfl:  .  metoprolol succinate (TOPROL-XL) 50 MG 24 hr tablet, Take 50 mg by mouth daily., Disp: , Rfl:  .  nitroGLYCERIN (NITROSTAT) 0.4 MG SL tablet, SMARTSIG:1 Sublingual 4-5 Times Daily, Disp: , Rfl:  .  oxybutynin (DITROPAN) 5 MG tablet, , Disp: , Rfl:  .  pregabalin (LYRICA) 50 MG capsule, Take 1 capsule (50 mg total) by mouth 3 (three) times daily., Disp: 90 capsule, Rfl: 2 .  pregabalin (LYRICA) 50 MG capsule, Take 50 mg by mouth 3 (three) times daily. (Patient not taking: Reported on 06/15/2020), Disp: , Rfl:  .  traMADol (ULTRAM) 50 MG tablet, Take 1-2 tablets (50-100 mg total) by mouth at bedtime. Must last 30 days, Disp: 60 tablet, Rfl: 2 .  traMADol (ULTRAM) 50 MG tablet, TAKE 1 2 TABLETS (50 100 MG TOTAL)  BY MOUTH AT BEDTIME AS NEEDED FOR SEVERE PAIN. MUST LAST 30 DAYS (Patient not taking: Reported on 06/15/2020), Disp: , Rfl:  .  Vitamin D, Ergocalciferol, (DRISDOL) 1.25 MG (50000 UNIT) CAPS capsule, Take 50,000 Units by mouth 2 (two) times a week. (Patient not taking: Reported on 06/15/2020), Disp: , Rfl:   Past Medical History: Past Medical History:  Diagnosis Date  . Abdominal pain   . Acid reflux 01/04/2014  . Acute respiratory failure with hypoxemia ([place]) 09/20/2014  . Anemia   . Anxiety   . Arthritis   . Asthma   . Benign neoplasm of sigmoid colon   . Billowing mitral valve   . Calculus of kidney 02/07/2013  . Complication of anesthesia    pneumonia after anesthesia  . COPD (chronic obstructive pulmonary disease) ([place])   . Depression   . Diabetes mellitus without complication ([place])   . Diverticulitis   . Essential (primary) hypertension 01/10/2015  . GERD (gastroesophageal reflux disease)   . Hypertension   . Incomplete emptying of bladder 07/05/2014  . Kidney stones   . Neuropathy   . PVC (premature ventricular contraction)   . Sciatica of left side   . Septic shock ([place]) 2015  . Sigmoid diverticulitis 01/27/2017  . Sleep apnea    CPAP  . Tachycardia 04/17/2014  . Thrombocytopenia ([place]) 09/20/2014    Tobacco Use: Social History   Tobacco Use  Smoking Status Former Smoker  . Quit date: 01/27/2000  . Years since quitting: 20.5  Smokeless Tobacco Never Used    Labs: Recent Review Scientist, physiological    Labs for ITP Cardiac and Pulmonary Rehab Latest Ref Rng & Units 09/21/2017 09/22/2017   Trlycerides <150 mg/dL - 231(H)   PHART 7.350 - 7.450 7.28(L) -   PCO2ART 32.0 - 48.0 mmHg 52(H) -   HCO3 20.0 - 28.0 mmol/L 24.4 -   ACIDBASEDEF 0.0 - 2.0 mmol/L 3.1(H) -   O2SAT % 91.4 -       Exercise Target Goals: Exercise Program Goal: Individual exercise prescription set using results from initial 6 min walk test and THRR while considering  patient's activity barriers and  safety.   Exercise Prescription Goal: Initial exercise prescription builds to 30-45 minutes a day of aerobic activity, 2-3 days per week.  Home exercise guidelines will be given to patient during program as part of exercise prescription that the participant will acknowledge.   Education: Aerobic Exercise: - Group verbal and visual presentation on the components of exercise prescription. Introduces F.I.T.T principle from ACSM for exercise prescriptions.  Reviews F.I.T.T. principles of aerobic exercise including progression. Written material given at graduation.   Education: Resistance Exercise: - Group verbal and visual presentation on the components of exercise prescription. Introduces F.I.T.T principle from ACSM for exercise prescriptions  Reviews F.I.T.T. principles  of resistance exercise including progression. Written material given at graduation.    Education: Exercise & Equipment Safety: - Individual verbal instruction and demonstration of equipment use and safety with use of the equipment. Flowsheet Row Cardiac Rehab from 06/28/2020 in [facility] Cardiac and Pulmonary Rehab  Date 06/28/20  Educator AS  Instruction Review Code 1- Verbalizes Understanding      Education: Exercise Physiology & General Exercise Guidelines: - Group verbal and written instruction with models to review the exercise physiology of the cardiovascular system and associated critical values. Provides general exercise guidelines with specific guidelines to those with heart or lung disease.    Education: Flexibility, Balance, Mind/Body Relaxation: - Group verbal and visual presentation with interactive activity on the components of exercise prescription. Introduces F.I.T.T principle from ACSM for exercise prescriptions. Reviews F.I.T.T. principles of flexibility and balance exercise training including progression. Also discusses the mind body connection.  Reviews various relaxation techniques to help reduce and manage  stress (i.e. Deep breathing, progressive muscle relaxation, and visualization). Balance handout provided to take home. Written material given at graduation.   Activity Barriers & Risk Stratification:  Activity Barriers & Cardiac Risk Stratification - 06/15/20 1403      Activity Barriers & Cardiac Risk Stratification   Activity Barriers Other (comment)    Comments neuropathy in both legs    Cardiac Risk Stratification High           6 Minute Walk:  6 Minute Walk    Row Name 06/28/20 1640         6 Minute Walk   Phase Initial     Distance 1615 feet     Walk Time 6 minutes     # of Rest Breaks 0     MPH 3     METS 4.25     RPE 15     Perceived Dyspnea  3     VO2 Peak 14.86     Symptoms Yes (comment)     Comments hip pain 5/10     Resting HR 93 bpm     Resting BP 108/64     Resting Oxygen Saturation  97 %     Exercise Oxygen Saturation  during 6 min walk 96 %     Max Ex. HR 132 bpm     Max Ex. BP 152/76     2 Minute Post BP 116/66            Oxygen Initial Assessment:   Oxygen Re-Evaluation:   Oxygen Discharge (Final Oxygen Re-Evaluation):   Initial Exercise Prescription:  Initial Exercise Prescription - 06/28/20 1600      Date of Initial Exercise RX and Referring Provider   Date 06/28/20    Referring Provider Tamala Julian      Recumbant Bike   Level 4    RPM 60    Watts 76    Minutes 15    METs 4.25      NuStep   Level 4    SPM 80    Minutes 15    METs 4.25      REL-XR   Level 6    Watts 76    Speed 50    Minutes 15    METs 4.25      T5 Nustep   Level 3    SPM 80    Minutes 15    METs 4.25      Biostep-RELP   Level 4    SPM 50  Minutes 15    METs 4      Prescription Details   Frequency (times per week) 3    Duration Progress to 30 minutes of continuous aerobic without signs/symptoms of physical distress      Intensity   THRR 40-80% of Max Heartrate 119-147    Ratings of Perceived Exertion 11-15    Perceived Dyspnea 0-4       Resistance Training   Training Prescription Yes    Weight 5 lb    Reps 10-15           Perform Capillary Blood Glucose checks as needed.  Exercise Prescription Changes:  Exercise Prescription Changes    Row Name 06/28/20 1600 07/11/20 1500           Response to Exercise   Blood Pressure (Admit) 108/64 104/60      Blood Pressure (Exercise) 152/76 130/64      Blood Pressure (Exit) 116/66 140/80      Heart Rate (Admit) 93 bpm 58 bpm      Heart Rate (Exercise) 135 bpm 117 bpm      Heart Rate (Exit) 99 bpm 77 bpm      Oxygen Saturation (Admit) 97 % --      Oxygen Saturation (Exercise) 96 % --      Rating of Perceived Exertion (Exercise) 15 13      Perceived Dyspnea (Exercise) 3 --      Symptoms hip pain 5/10 none      Comments -- second day      Duration -- Progress to 30 minutes of  aerobic without signs/symptoms of physical distress      Intensity -- THRR unchanged             Progression   Progression -- Continue to progress workloads to maintain intensity without signs/symptoms of physical distress.      Average METs -- 3             Resistance Training   Training Prescription -- Yes      Weight -- 3 lb      Reps -- 10-15             NuStep   Level -- 4      SPM -- 80      Minutes -- 15      METs -- 3             Exercise Comments:   Exercise Goals and Review:  Exercise Goals    Row Name 06/28/20 1645             Exercise Goals   Increase Physical Activity Yes       Intervention Provide advice, education, support and counseling about physical activity/exercise needs.;Develop an individualized exercise prescription for aerobic and resistive training based on initial evaluation findings, risk stratification, comorbidities and participant's personal goals.       Expected Outcomes Short Term: Attend rehab on a regular basis to increase amount of physical activity.;Long Term: Add in home exercise to make exercise part of routine and to increase amount  of physical activity.;Long Term: Exercising regularly at least 3-5 days a week.       Increase Strength and Stamina Yes       Intervention Provide advice, education, support and counseling about physical activity/exercise needs.;Develop an individualized exercise prescription for aerobic and resistive training based on initial evaluation findings, risk stratification, comorbidities and participant's personal goals.  Expected Outcomes Short Term: Increase workloads from initial exercise prescription for resistance, speed, and METs.;Short Term: Perform resistance training exercises routinely during rehab and add in resistance training at home;Long Term: Improve cardiorespiratory fitness, muscular endurance and strength as measured by increased METs and functional capacity (6MWT)       Able to understand and use rate of perceived exertion (RPE) scale Yes       Intervention Provide education and explanation on how to use RPE scale       Expected Outcomes Short Term: Able to use RPE daily in rehab to express subjective intensity level;Long Term:  Able to use RPE to guide intensity level when exercising independently       Able to understand and use Dyspnea scale Yes       Intervention Provide education and explanation on how to use Dyspnea scale       Expected Outcomes Short Term: Able to use Dyspnea scale daily in rehab to express subjective sense of shortness of breath during exertion;Long Term: Able to use Dyspnea scale to guide intensity level when exercising independently       Knowledge and understanding of Target Heart Rate Range (THRR) Yes       Intervention Provide education and explanation of THRR including how the numbers were predicted and where they are located for reference       Expected Outcomes Short Term: Able to state/look up THRR;Short Term: Able to use daily as guideline for intensity in rehab;Long Term: Able to use THRR to govern intensity when exercising independently       Able  to check pulse independently Yes       Intervention Provide education and demonstration on how to check pulse in carotid and radial arteries.;Review the importance of being able to check your own pulse for safety during independent exercise       Expected Outcomes Short Term: Able to explain why pulse checking is important during independent exercise;Long Term: Able to check pulse independently and accurately       Understanding of Exercise Prescription Yes       Intervention Provide education, explanation, and written materials on patient's individual exercise prescription       Expected Outcomes Short Term: Able to explain program exercise prescription;Long Term: Able to explain home exercise prescription to exercise independently              Exercise Goals Re-Evaluation :  Exercise Goals Re-Evaluation    Row Name 07/02/20 1635 07/11/20 1514           Exercise Goal Re-Evaluation   Exercise Goals Review Increase Physical Activity;Able to understand and use rate of perceived exertion (RPE) scale;Knowledge and understanding of Target Heart Rate Range (THRR);Understanding of Exercise Prescription;Increase Strength and Stamina;Able to check pulse independently Increase Physical Activity;Increase Strength and Stamina      Comments Reviewed RPE and dyspnea scales, THR and program prescription with pt today.  Pt voiced understanding and was given a copy of goals to take home. tammy has completed 2 exercise sessions. Staff will monitor progress.      Expected Outcomes Short: Use RPE daily to regulate intensity. Long: Follow program prescription in THR. Short: attend consistently Long: build overall stamina             Discharge Exercise Prescription (Final Exercise Prescription Changes):  Exercise Prescription Changes - 07/11/20 1500      Response to Exercise   Blood Pressure (Admit) 104/60    Blood Pressure (  Exercise) 130/64    Blood Pressure (Exit) 140/80    Heart Rate (Admit) 58 bpm     Heart Rate (Exercise) 117 bpm    Heart Rate (Exit) 77 bpm    Rating of Perceived Exertion (Exercise) 13    Symptoms none    Comments second day    Duration Progress to 30 minutes of  aerobic without signs/symptoms of physical distress    Intensity THRR unchanged      Progression   Progression Continue to progress workloads to maintain intensity without signs/symptoms of physical distress.    Average METs 3      Resistance Training   Training Prescription Yes    Weight 3 lb    Reps 10-15      NuStep   Level 4    SPM 80    Minutes 15    METs 3           Nutrition:  Target Goals: Understanding of nutrition guidelines, daily intake of sodium 1500mg , cholesterol 200mg , calories 30% from fat and 7% or less from saturated fats, daily to have 5 or more servings of fruits and vegetables.  Education: All About Nutrition: -Group instruction provided by verbal, written material, interactive activities, discussions, models, and posters to present general guidelines for heart healthy nutrition including fat, fiber, MyPlate, the role of sodium in heart healthy nutrition, utilization of the nutrition label, and utilization of this knowledge for meal planning. Follow up email sent as well. Written material given at graduation.   Biometrics:  Pre Biometrics - 06/28/20 1646      Pre Biometrics   Height 5' 10.5" (1.791 m)    Weight 234 lb 4.8 oz (106.3 kg)    BMI (Calculated) 33.13            Nutrition Therapy Plan and Nutrition Goals:   Nutrition Assessments:  Nutrition Assessments - 06/28/20 1647      MEDFICTS Scores   Pre Score 24          MEDIFICTS Score Key:  ?70 Need to make dietary changes   40-70 Heart Healthy Diet  ? 40 Therapeutic Level Cholesterol Diet   Picture Your Plate Scores:  <81 Unhealthy dietary pattern with much room for improvement.  41-50 Dietary pattern unlikely to meet recommendations for good health and room for improvement.  51-60  More healthful dietary pattern, with some room for improvement.   >60 Healthy dietary pattern, although there may be some specific behaviors that could be improved.    Nutrition Goals Re-Evaluation:   Nutrition Goals Discharge (Final Nutrition Goals Re-Evaluation):   Psychosocial: Target Goals: Acknowledge presence or absence of significant depression and/or stress, maximize coping skills, provide positive support system. Participant is able to verbalize types and ability to use techniques and skills needed for reducing stress and depression.   Education: Stress, Anxiety, and Depression - Group verbal and visual presentation to define topics covered.  Reviews how body is impacted by stress, anxiety, and depression.  Also discusses healthy ways to reduce stress and to treat/manage anxiety and depression.  Written material given at graduation.   Education: Sleep Hygiene -Provides group verbal and written instruction about how sleep can affect your health.  Define sleep hygiene, discuss sleep cycles and impact of sleep habits. Review good sleep hygiene tips.    Initial Review & Psychosocial Screening:  Initial Psych Review & Screening - 06/15/20 1408      Initial Review   Current issues with Current Stress Concerns;Current  Sleep Concerns    Source of Stress Concerns Family      Family Dynamics   Good Support System? Yes      Barriers   Psychosocial barriers to participate in program There are no identifiable barriers or psychosocial needs.;The patient should benefit from training in stress management and relaxation.      Screening Interventions   Interventions Encouraged to exercise;To provide support and resources with identified psychosocial needs;Provide feedback about the scores to participant    Expected Outcomes Short Term goal: Utilizing psychosocial counselor, staff and physician to assist with identification of specific Stressors or current issues interfering with healing  process. Setting desired goal for each stressor or current issue identified.;Long Term Goal: Stressors or current issues are controlled or eliminated.;Short Term goal: Identification and review with participant of any Quality of Life or Depression concerns found by scoring the questionnaire.;Long Term goal: The participant improves quality of Life and PHQ9 Scores as seen by post scores and/or verbalization of changes           Quality of Life Scores:   Quality of Life - 06/28/20 1648      Quality of Life   Select Quality of Life      Quality of Life Scores   Health/Function Pre 20.73 %    Socioeconomic Pre 19.25 %    Psych/Spiritual Pre 28.29 %    Family Pre 19.7 %    GLOBAL Pre 21.91 %          Scores of 19 and below usually indicate a poorer quality of life in these areas.  A difference of  2-3 points is a clinically meaningful difference.  A difference of 2-3 points in the total score of the Quality of Life Index has been associated with significant improvement in overall quality of life, self-image, physical symptoms, and general health in studies assessing change in quality of life.  PHQ-9: Recent Review Flowsheet Data    Depression screen [facility] 2/9 06/28/2020 09/30/2019   Decreased Interest 0 0   Down, Depressed, Hopeless 0 0   PHQ - 2 Score 0 0   Altered sleeping 1 -   Tired, decreased energy 1 -   Change in appetite 2 -   Feeling bad or failure about yourself  0 -   Trouble concentrating 0 -   Moving slowly or fidgety/restless 0 -   Suicidal thoughts 0 -   PHQ-9 Score 4 -     Interpretation of Total Score  Total Score Depression Severity:  1-4 = Minimal depression, 5-9 = Mild depression, 10-14 = Moderate depression, 15-19 = Moderately severe depression, 20-27 = Severe depression   Psychosocial Evaluation and Intervention:  Psychosocial Evaluation - 06/15/20 1412      Psychosocial Evaluation & Interventions   Interventions Stress management education;Encouraged to  exercise with the program and follow exercise prescription;Relaxation education    Comments 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 The dogs keep her very busy but she enjoys it. She states her husband of 40+ years causes some stress, but she stands her ground. She struggles with neuropathy in both legs which causes a great deal of pain. She has a hard time going to sleep because of the pain. She is hoping this will help with her balance concerns and stamina.    Expected Outcomes Short: attend cardiac rehab for education and exercise. Long: develop positive self care habits.    Continue Psychosocial Services  Follow up required by staff  Psychosocial Re-Evaluation:   Psychosocial Discharge (Final Psychosocial Re-Evaluation):   Vocational Rehabilitation: Provide vocational rehab assistance to qualifying candidates.   Vocational Rehab Evaluation & Intervention:  Vocational Rehab - 06/15/20 1408      Initial Vocational Rehab Evaluation & Intervention   Assessment shows need for Vocational Rehabilitation No           Education: Education Goals: Education classes will be provided on a variety of topics geared toward better understanding of heart health and risk factor modification. Participant will state understanding/return demonstration of topics presented as noted by education test scores.  Learning Barriers/Preferences:  Learning Barriers/Preferences - 06/15/20 1408      Learning Barriers/Preferences   Learning Barriers None    Learning Preferences None           General Cardiac Education Topics:  AED/CPR: - Group verbal and written instruction with the use of models to demonstrate the basic use of the AED with the basic ABC's of resuscitation.   Anatomy and Cardiac Procedures: - Group verbal and visual presentation and models provide information about basic cardiac anatomy and function. Reviews the testing methods done to diagnose heart disease and the outcomes  of the test results. Describes the treatment choices: Medical Management, Angioplasty, or Coronary Bypass Surgery for treating various heart conditions including Myocardial Infarction, Angina, Valve Disease, and Cardiac Arrhythmias.  Written material given at graduation.   Medication Safety: - Group verbal and visual instruction to review commonly prescribed medications for heart and lung disease. Reviews the medication, class of the drug, and side effects. Includes the steps to properly store meds and maintain the prescription regimen.  Written material given at graduation.   Intimacy: - Group verbal instruction through game format to discuss how heart and lung disease can affect sexual intimacy. Written material given at graduation..   Know Your Numbers and Heart Failure: - Group verbal and visual instruction to discuss disease risk factors for cardiac and pulmonary disease and treatment options.  Reviews associated critical values for Overweight/Obesity, Hypertension, Cholesterol, and Diabetes.  Discusses basics of heart failure: signs/symptoms and treatments.  Introduces Heart Failure Zone chart for action plan for heart failure.  Written material given at graduation.   Infection Prevention: - Provides verbal and written material to individual with discussion of infection control including proper hand washing and proper equipment cleaning during exercise session. Flowsheet Row Cardiac Rehab from 06/28/2020 in [facility] Cardiac and Pulmonary Rehab  Date 06/28/20  Educator AS  Instruction Review Code 1- Verbalizes Understanding      Falls Prevention: - Provides verbal and written material to individual with discussion of falls prevention and safety. Flowsheet Row Cardiac Rehab from 06/28/2020 in [facility] Cardiac and Pulmonary Rehab  Date 06/28/20  Educator AS  Instruction Review Code 1- Verbalizes Understanding      Other: -Provides group and verbal instruction on various topics (see  comments)   Knowledge Questionnaire Score:  Knowledge Questionnaire Score - 06/28/20 1648      Knowledge Questionnaire Score   Pre Score 23/26           Core Components/Risk Factors/Patient Goals at Admission:  Personal Goals and Risk Factors at Admission - 06/28/20 1647      Core Components/Risk Factors/Patient Goals on Admission    Weight Management Yes    Intervention Weight Management: Develop a combined nutrition and exercise program designed to reach desired caloric intake, while maintaining appropriate intake of nutrient and fiber, sodium and fats, and appropriate energy expenditure required for  the weight goal.    Admit Weight 234 lb 4.8 oz (106.3 kg)    Goal Weight: Short Term 230 lb (104.3 kg)    Goal Weight: Long Term 225 lb (102.1 kg)    Expected Outcomes Short Term: Continue to assess and modify interventions until short term weight is achieved;Long Term: Adherence to nutrition and physical activity/exercise program aimed toward attainment of established weight goal;Understanding recommendations for meals to include 15-35% energy as protein, 25-35% energy from fat, 35-60% energy from carbohydrates, less than 200mg  of dietary cholesterol, 20-35 gm of total fiber daily;Understanding of distribution of calorie intake throughout the day with the consumption of 4-5 meals/snacks    Diabetes Yes    Intervention Provide education about signs/symptoms and action to take for hypo/hyperglycemia.;Provide education about proper nutrition, including hydration, and aerobic/resistive exercise prescription along with prescribed medications to achieve blood glucose in normal ranges: Fasting glucose 65-99 mg/dL    Expected Outcomes Short Term: Participant verbalizes understanding of the signs/symptoms and immediate care of hyper/hypoglycemia, proper foot care and importance of medication, aerobic/resistive exercise and nutrition plan for blood glucose control.;Long Term: Attainment of HbA1C < 7%.     Hypertension Yes    Intervention Provide education on lifestyle modifcations including regular physical activity/exercise, weight management, moderate sodium restriction and increased consumption of fresh fruit, vegetables, and low fat dairy, alcohol moderation, and smoking cessation.;Monitor prescription use compliance.    Expected Outcomes Short Term: Continued assessment and intervention until BP is < 140/30mm HG in hypertensive participants. < 130/29mm HG in hypertensive participants with diabetes, heart failure or chronic kidney disease.;Long Term: Maintenance of blood pressure at goal levels.    Lipids Yes    Intervention Provide education and support for participant on nutrition & aerobic/resistive exercise along with prescribed medications to achieve LDL 70mg , HDL >40mg .    Expected Outcomes Short Term: Participant states understanding of desired cholesterol values and is compliant with medications prescribed. Participant is following exercise prescription and nutrition guidelines.;Long Term: Cholesterol controlled with medications as prescribed, with individualized exercise RX and with personalized nutrition plan. Value goals: LDL < 70mg , HDL > 40 mg.           Education:Diabetes - Individual verbal and written instruction to review signs/symptoms of diabetes, desired ranges of glucose level fasting, after meals and with exercise. Acknowledge that pre and post exercise glucose checks will be done for 3 sessions at entry of program. [place] from 06/15/2020 in [facility] Cardiac and Pulmonary Rehab  Date 06/15/20  Educator [facility]  Instruction Review Code 1- Verbalizes Understanding      Core Components/Risk Factors/Patient Goals Review:    Core Components/Risk Factors/Patient Goals at Discharge (Final Review):    ITP Comments:  ITP Comments    Row Name 06/15/20 1418 07/02/20 1634 07/25/20 0920 08/06/20 1206 08/09/20 0812   ITP Comments Initial telephone orientation  completed. Diagnosis can be found in [facility] 10/8. EP orientation scheduled for Thursday 11/4 at 3:30. First full day of exercise!  Patient was oriented to gym and equipment including functions, settings, policies, and procedures.  Patient's individual exercise prescription and treatment plan were reviewed.  All starting workloads were established based on the results of the 6 minute walk test done at initial orientation visit.  The plan for exercise progression was also introduced and progression will be customized based on patient's performance and goals. 30 Day review completed. Medical Director ITP review done, changes made as directed, and signed approval by Medical Director. New to  program Last visit 11/11 [facility] as we have not seen her back in rehab or heard from her. Have tried previously to contact, but mailbox has been full. Mailbox is still full. Will send a letter at this time. Tammy has not attended since last exercise review.   [place] Name 08/22/20 0537           ITP Comments 30 Day review completed. Medical Director ITP review done, changes made as directed, and signed approval by Medical Director.              Comments:  Discharge ITP

## 2020-08-23 NOTE — Progress Notes (Signed)
Discharge Progress Report  Patient Details  Name: Grace Hester MRN: [id number] Date of Birth: June 07, 1961 Referring Provider:   Flowsheet Row Cardiac Rehab from 06/28/2020 in [facility] Cardiac and Pulmonary Rehab  Referring Provider Tamala Julian       Number of Visits: 3 Reason for Discharge:  Early Exit:  Lack of attendance  Smoking History:  Social History   Tobacco Use  Smoking Status Former Smoker  . Quit date: 01/27/2000  . Years since quitting: 20.5  Smokeless Tobacco Never Used    Diagnosis:  Status post coronary artery stent placement  ADL UCSD:   Initial Exercise Prescription:  Initial Exercise Prescription - 06/28/20 1600      Date of Initial Exercise RX and Referring Provider   Date 06/28/20    Referring Provider Tamala Julian      Recumbant Bike   Level 4    RPM 60    Watts 76    Minutes 15    METs 4.25      NuStep   Level 4    SPM 80    Minutes 15    METs 4.25      REL-XR   Level 6    Watts 76    Speed 50    Minutes 15    METs 4.25      T5 Nustep   Level 3    SPM 80    Minutes 15    METs 4.25      Biostep-RELP   Level 4    SPM 50    Minutes 15    METs 4      Prescription Details   Frequency (times per week) 3    Duration Progress to 30 minutes of continuous aerobic without signs/symptoms of physical distress      Intensity   THRR 40-80% of Max Heartrate 119-147    Ratings of Perceived Exertion 11-15    Perceived Dyspnea 0-4      Resistance Training   Training Prescription Yes    Weight 5 lb    Reps 10-15           Discharge Exercise Prescription (Final Exercise Prescription Changes):  Exercise Prescription Changes - 07/11/20 1500      Response to Exercise   Blood Pressure (Admit) 104/60    Blood Pressure (Exercise) 130/64    Blood Pressure (Exit) 140/80    Heart Rate (Admit) 58 bpm    Heart Rate (Exercise) 117 bpm    Heart Rate (Exit) 77 bpm    Rating of Perceived Exertion (Exercise) 13    Symptoms none    Comments second  day    Duration Progress to 30 minutes of  aerobic without signs/symptoms of physical distress    Intensity THRR unchanged      Progression   Progression Continue to progress workloads to maintain intensity without signs/symptoms of physical distress.    Average METs 3      Resistance Training   Training Prescription Yes    Weight 3 lb    Reps 10-15      NuStep   Level 4    SPM 80    Minutes 15    METs 3           Functional Capacity:  6 Minute Walk    Row Name 06/28/20 1640         6 Minute Walk   Phase Initial     Distance 1615 feet     Walk Time  6 minutes     # of Rest Breaks 0     MPH 3     METS 4.25     RPE 15     Perceived Dyspnea  3     VO2 Peak 14.86     Symptoms Yes (comment)     Comments hip pain 5/10     Resting HR 93 bpm     Resting BP 108/64     Resting Oxygen Saturation  97 %     Exercise Oxygen Saturation  during 6 min walk 96 %     Max Ex. HR 132 bpm     Max Ex. BP 152/76     2 Minute Post BP 116/66            Psychological, QOL, Others - Outcomes: PHQ 2/9: Depression screen [facility] 2/9 06/28/2020 09/30/2019  Decreased Interest 0 0  Down, Depressed, Hopeless 0 0  PHQ - 2 Score 0 0  Altered sleeping 1 -  Tired, decreased energy 1 -  Change in appetite 2 -  Feeling bad or failure about yourself  0 -  Trouble concentrating 0 -  Moving slowly or fidgety/restless 0 -  Suicidal thoughts 0 -  PHQ-9 Score 4 -    Quality of Life:  Quality of Life - 06/28/20 1648      Quality of Life   Select Quality of Life      Quality of Life Scores   Health/Function Pre 20.73 %    Socioeconomic Pre 19.25 %    Psych/Spiritual Pre 28.29 %    Family Pre 19.7 %    GLOBAL Pre 21.91 %           Personal Goals: Goals established at orientation with interventions provided to work toward goal.  Personal Goals and Risk Factors at Admission - 06/28/20 1647      Core Components/Risk Factors/Patient Goals on Admission    Weight Management Yes     Intervention Weight Management: Develop a combined nutrition and exercise program designed to reach desired caloric intake, while maintaining appropriate intake of nutrient and fiber, sodium and fats, and appropriate energy expenditure required for the weight goal.    Admit Weight 234 lb 4.8 oz (106.3 kg)    Goal Weight: Short Term 230 lb (104.3 kg)    Goal Weight: Long Term 225 lb (102.1 kg)    Expected Outcomes Short Term: Continue to assess and modify interventions until short term weight is achieved;Long Term: Adherence to nutrition and physical activity/exercise program aimed toward attainment of established weight goal;Understanding recommendations for meals to include 15-35% energy as protein, 25-35% energy from fat, 35-60% energy from carbohydrates, less than 200mg  of dietary cholesterol, 20-35 gm of total fiber daily;Understanding of distribution of calorie intake throughout the day with the consumption of 4-5 meals/snacks    Diabetes Yes    Intervention Provide education about signs/symptoms and action to take for hypo/hyperglycemia.;Provide education about proper nutrition, including hydration, and aerobic/resistive exercise prescription along with prescribed medications to achieve blood glucose in normal ranges: Fasting glucose 65-99 mg/dL    Expected Outcomes Short Term: Participant verbalizes understanding of the signs/symptoms and immediate care of hyper/hypoglycemia, proper foot care and importance of medication, aerobic/resistive exercise and nutrition plan for blood glucose control.;Long Term: Attainment of HbA1C < 7%.    Hypertension Yes    Intervention Provide education on lifestyle modifcations including regular physical activity/exercise, weight management, moderate sodium restriction and increased consumption of fresh fruit, vegetables,  and low fat dairy, alcohol moderation, and smoking cessation.;Monitor prescription use compliance.    Expected Outcomes Short Term: Continued  assessment and intervention until BP is < 140/15mm HG in hypertensive participants. < 130/22mm HG in hypertensive participants with diabetes, heart failure or chronic kidney disease.;Long Term: Maintenance of blood pressure at goal levels.    Lipids Yes    Intervention Provide education and support for participant on nutrition & aerobic/resistive exercise along with prescribed medications to achieve LDL 70mg , HDL >40mg .    Expected Outcomes Short Term: Participant states understanding of desired cholesterol values and is compliant with medications prescribed. Participant is following exercise prescription and nutrition guidelines.;Long Term: Cholesterol controlled with medications as prescribed, with individualized exercise RX and with personalized nutrition plan. Value goals: LDL < 70mg , HDL > 40 mg.            Personal Goals Discharge:   Exercise Goals and Review:  Exercise Goals    Row Name 06/28/20 1645             Exercise Goals   Increase Physical Activity Yes       Intervention Provide advice, education, support and counseling about physical activity/exercise needs.;Develop an individualized exercise prescription for aerobic and resistive training based on initial evaluation findings, risk stratification, comorbidities and participant's personal goals.       Expected Outcomes Short Term: Attend rehab on a regular basis to increase amount of physical activity.;Long Term: Add in home exercise to make exercise part of routine and to increase amount of physical activity.;Long Term: Exercising regularly at least 3-5 days a week.       Increase Strength and Stamina Yes       Intervention Provide advice, education, support and counseling about physical activity/exercise needs.;Develop an individualized exercise prescription for aerobic and resistive training based on initial evaluation findings, risk stratification, comorbidities and participant's personal goals.       Expected Outcomes  Short Term: Increase workloads from initial exercise prescription for resistance, speed, and METs.;Short Term: Perform resistance training exercises routinely during rehab and add in resistance training at home;Long Term: Improve cardiorespiratory fitness, muscular endurance and strength as measured by increased METs and functional capacity (6MWT)       Able to understand and use rate of perceived exertion (RPE) scale Yes       Intervention Provide education and explanation on how to use RPE scale       Expected Outcomes Short Term: Able to use RPE daily in rehab to express subjective intensity level;Long Term:  Able to use RPE to guide intensity level when exercising independently       Able to understand and use Dyspnea scale Yes       Intervention Provide education and explanation on how to use Dyspnea scale       Expected Outcomes Short Term: Able to use Dyspnea scale daily in rehab to express subjective sense of shortness of breath during exertion;Long Term: Able to use Dyspnea scale to guide intensity level when exercising independently       Knowledge and understanding of Target Heart Rate Range (THRR) Yes       Intervention Provide education and explanation of THRR including how the numbers were predicted and where they are located for reference       Expected Outcomes Short Term: Able to state/look up THRR;Short Term: Able to use daily as guideline for intensity in rehab;Long Term: Able to use THRR to govern intensity when exercising independently  Able to check pulse independently Yes       Intervention Provide education and demonstration on how to check pulse in carotid and radial arteries.;Review the importance of being able to check your own pulse for safety during independent exercise       Expected Outcomes Short Term: Able to explain why pulse checking is important during independent exercise;Long Term: Able to check pulse independently and accurately       Understanding of Exercise  Prescription Yes       Intervention Provide education, explanation, and written materials on patient's individual exercise prescription       Expected Outcomes Short Term: Able to explain program exercise prescription;Long Term: Able to explain home exercise prescription to exercise independently              Exercise Goals Re-Evaluation:  Exercise Goals Re-Evaluation    Row Name 07/02/20 1635 07/11/20 1514           Exercise Goal Re-Evaluation   Exercise Goals Review Increase Physical Activity;Able to understand and use rate of perceived exertion (RPE) scale;Knowledge and understanding of Target Heart Rate Range (THRR);Understanding of Exercise Prescription;Increase Strength and Stamina;Able to check pulse independently Increase Physical Activity;Increase Strength and Stamina      Comments Reviewed RPE and dyspnea scales, THR and program prescription with pt today.  Pt voiced understanding and was given a copy of goals to take home. tammy has completed 2 exercise sessions. Staff will monitor progress.      Expected Outcomes Short: Use RPE daily to regulate intensity. Long: Follow program prescription in THR. Short: attend consistently Long: build overall stamina             Nutrition & Weight - Outcomes:  Pre Biometrics - 06/28/20 1646      Pre Biometrics   Height 5' 10.5" (1.791 m)    Weight 234 lb 4.8 oz (106.3 kg)    BMI (Calculated) 33.13            Nutrition:   Nutrition Discharge:  Nutrition Assessments - 06/28/20 1647      MEDFICTS Scores   Pre Score 24           Education Questionnaire Score:  Knowledge Questionnaire Score - 06/28/20 1648      Knowledge Questionnaire Score   Pre Score 23/26           Goals reviewed with patient; copy given to patient.

## 2020-11-21 ENCOUNTER — Other Ambulatory Visit: Payer: Self-pay | Admitting: Ophthalmology

## 2020-11-21 DIAGNOSIS — H5340 Unspecified visual field defects: Secondary | ICD-10-CM

## 2020-12-02 ENCOUNTER — Ambulatory Visit: Admission: RE | Admit: 2020-12-02 | Payer: BC Managed Care – PPO | Source: Ambulatory Visit

## 2021-01-23 ENCOUNTER — Other Ambulatory Visit: Payer: Self-pay

## 2021-01-23 ENCOUNTER — Ambulatory Visit (INDEPENDENT_AMBULATORY_CARE_PROVIDER_SITE_OTHER): Payer: BC Managed Care – PPO

## 2021-01-23 ENCOUNTER — Ambulatory Visit
Admission: EM | Admit: 2021-01-23 | Discharge: 2021-01-23 | Disposition: A | Discharge status: Home / Self Care | Payer: BC Managed Care – PPO | Attending: Family Medicine | Admitting: Family Medicine

## 2021-01-23 DIAGNOSIS — J441 Chronic obstructive pulmonary disease with (acute) exacerbation: Secondary | ICD-10-CM

## 2021-01-23 DIAGNOSIS — R059 Cough, unspecified: Secondary | ICD-10-CM

## 2021-01-23 MED ORDER — HYDROCOD POLST-CPM POLST ER 10-8 MG/5ML PO SUER
5.0000 mL | Freq: Two times a day (BID) | ORAL | 0 refills | Status: AC | PRN
Start: 2021-01-23 — End: ?

## 2021-01-23 MED ORDER — DOXYCYCLINE HYCLATE 100 MG PO CAPS: 100.0000 mg | ORAL_CAPSULE | Freq: Two times a day (BID) | ORAL | 0 refills | Status: AC

## 2021-01-23 NOTE — ED Triage Notes (Signed)
Patient has been having a cough x 3 weeks. States that she saw her pcp and was treated with proair inhaler, augmentin and prednisone and improved but symptoms have worsened again. States that cough is worse when she is hot.

## 2021-01-23 NOTE — ED Provider Notes (Signed)
[facility]    CSN: [id number] Arrival date & time: 01/23/21  1554      History   Chief Complaint Chief Complaint  Patient presents with  . Cough   HPI  60 year old female presents with cough.  Patient reports that she has had cough for 3 weeks.  She has recently been treated with antibiotics and steroids by her PCP.  Inhaler has not really helped.  Cough is severe.  Worse when she is hot.  Productive.  She has had no relief with the above-mentioned therapeutics.  She has been tested for COVID-19.  No other reported symptoms.  No other complaints.  Past Medical History:  Diagnosis Date  . Abdominal pain   . Acid reflux 01/04/2014  . Acute respiratory failure with hypoxemia ([place]) 09/20/2014  . Anemia   . Anxiety   . Arthritis   . Asthma   . Benign neoplasm of sigmoid colon   . Billowing mitral valve   . Calculus of kidney 02/07/2013  . Complication of anesthesia    pneumonia after anesthesia  . COPD (chronic obstructive pulmonary disease) ([place])   . Depression   . Diabetes mellitus without complication ([place])   . Diverticulitis   . Essential (primary) hypertension 01/10/2015  . GERD (gastroesophageal reflux disease)   . Hypertension   . Incomplete emptying of bladder 07/05/2014  . Kidney stones   . Neuropathy   . PVC (premature ventricular contraction)   . Sciatica of left side   . Septic shock ([place]) 2015  . Sigmoid diverticulitis 01/27/2017  . Sleep apnea    CPAP  . Tachycardia 04/17/2014  . Thrombocytopenia ([place]) 09/20/2014    Patient Active Problem List   Diagnosis Date Noted  . Type 2 diabetes mellitus with circulatory disorder, without long-term current use of insulin ([place]) 06/01/2020  . Osteoarthritis of hips (Bilateral) 10/31/2019  . Levoscoliosis 10/31/2019  . Calcaneal spur of feet (Bilateral) 10/31/2019  . Neurogenic pain 10/31/2019  . Vitamin D insufficiency 10/31/2019  . Polyneuropathy, peripheral sensorimotor axonal 10/03/2019  . Chronic  feet pain (Bilateral) 10/03/2019  . Chronic ankle pain (Bilateral) 10/03/2019  . Chronic hip pain (Bilateral) (L>R) 10/03/2019  . Chronic knee pain (Bilateral) (R>L) 10/03/2019  . Chronic lower extremity pain (Bilateral) 10/03/2019  . Chronic hand pain (Bilateral) 10/03/2019  . History of lumbar surgery 10/03/2019  . Abnormal nerve conduction studies 10/03/2019  . Complaints of weakness of lower extremity 10/03/2019  . Anemia 09/29/2019  . Chronic pain syndrome 09/29/2019  . Pharmacologic therapy 09/29/2019  . Disorder of skeletal system 09/29/2019  . Problems influencing health status 09/29/2019  . S/P laparoscopic colectomy 09/21/2017  . Diverticulitis of colon without hemorrhage   . Benign neoplasm of ascending colon   . Palpitations 06/16/2017  . Loose stools 01/27/2017  . Sigmoid diverticulitis 01/27/2017  . Acute diverticulitis 10/28/2016  . Benign neoplasm of sigmoid colon   . Other diseases of stomach and duodenum   . Noninfectious gastroenteritis, unspecified   . Diarrhea   . Gastritis   . Absolute anemia 02/13/2016  . Anxiety 02/13/2016  . Billowing mitral valve 02/13/2016  . Beat, premature ventricular 02/13/2016  . Diarrhea, functional 01/15/2016  . Chronic cystitis 07/12/2015  . Pneumonia 04/28/2015  . Benign essential HTN 03/26/2015  . Essential (primary) hypertension 01/10/2015  . Proteus mirabilis infection 09/27/2014  . Thrombocytopenia ([place]) 09/20/2014  . Septic shock ([place]) 09/18/2014  . Acute cystitis 07/07/2014  . Dysuria 07/05/2014  . Incomplete emptying of bladder 07/05/2014  .  Tachycardia 04/17/2014  . Weakness 04/17/2014  . Acid reflux 01/04/2014  . Calculus of kidney 02/07/2013  . History of urinary stone 09/15/2012  . Mixed urge and stress incontinence 08/06/2012    Past Surgical History:  Procedure Laterality Date  . ABDOMINAL HYSTERECTOMY    . AUGMENTATION MAMMAPLASTY  2008  . COLECTOMY    . COLONOSCOPY WITH PROPOFOL N/A 03/14/2016    Procedure: COLONOSCOPY WITH PROPOFOL;  Surgeon: Lucilla Lame, MD;  Location: [place];  Service: Endoscopy;  Laterality: N/A;  . COLONOSCOPY WITH PROPOFOL N/A 07/27/2017   Procedure: COLONOSCOPY WITH Biopsy;  Surgeon: Lucilla Lame, MD;  Location: [place];  Service: Endoscopy;  Laterality: N/A;  . ESOPHAGOGASTRODUODENOSCOPY (EGD) WITH PROPOFOL N/A 03/14/2016   Procedure: ESOPHAGOGASTRODUODENOSCOPY (EGD) WITH PROPOFOL;  Surgeon: Lucilla Lame, MD;  Location: [place];  Service: Endoscopy;  Laterality: N/A;  . FLEXOR TENDON REPAIR Left 08/04/13   Dr. Elvina Mattes, [facility]  . GANGLION CYST EXCISION Left   . IMAGE GUIDED SINUS SURGERY    . KIDNEY STONE SURGERY  2015  . LAPAROSCOPIC SIGMOID COLECTOMY N/A 09/21/2017   Procedure: LAPAROSCOPIC SIGMOID COLECTOMY;  Surgeon: Jules Husbands, MD;  Location: [facility];  Service: General;  Laterality: N/A;  . POLYPECTOMY  03/14/2016   Procedure: POLYPECTOMY;  Surgeon: Lucilla Lame, MD;  Location: [place];  Service: Endoscopy;;  . POLYPECTOMY  07/27/2017   Procedure: POLYPECTOMY INTESTINAL;  Surgeon: Lucilla Lame, MD;  Location: [place];  Service: Endoscopy;;  Ascending colon polyp  . ROTATOR CUFF REPAIR Left     OB History   No obstetric history on file.      Home Medications    Prior to Admission medications   Medication Sig Start Date End Date Taking? Authorizing Provider  albuterol (PROVENTIL HFA;VENTOLIN HFA) 108 (90 Base) MCG/ACT inhaler Inhale 1-2 puffs into the lungs every 6 (six) hours as needed for wheezing or shortness of breath. 04/03/16  Yes Lorin Picket, PA-C  albuterol (VENTOLIN HFA) 108 (90 Base) MCG/ACT inhaler  12/21/19  Yes [provider]  amitriptyline (ELAVIL) 10 MG tablet  12/17/19  Yes [provider]  atorvastatin (LIPITOR) 40 MG tablet Take 40 mg by mouth at bedtime. 06/02/20  Yes [provider]  busPIRone (BUSPAR) 10 MG tablet Take 10 mg by mouth 2 (two)  times daily. 2 tabs twice per day   Yes [provider]  chlorpheniramine-HYDROcodone (TUSSIONEX PENNKINETIC ER) 10-8 MG/5ML SUER Take 5 mLs by mouth every 12 (twelve) hours as needed for cough. 01/23/21  Yes Thersa Salt G, DO  clopidogrel (PLAVIX) 75 MG tablet Take 75 mg by mouth daily. 06/02/20  Yes [provider]  [employer] D3 125 MCG (5000 UT) capsule Take 5,000 Units by mouth every morning. 10/31/19  Yes [provider]  doxepin (SINEQUAN) 50 MG capsule Take 50 mg by mouth at bedtime. 05/14/20  Yes [provider]  doxycycline (VIBRAMYCIN) 100 MG capsule Take 1 capsule (100 mg total) by mouth 2 (two) times daily. 01/23/21  Yes Mishelle Hassan G, DO  EUCRISA 2 % OINT Apply 1 application topically at bedtime. 07/01/17  Yes [provider]  fexofenadine (ALLEGRA) 180 MG tablet Take 180 mg by mouth daily.    Yes [provider]  isosorbide mononitrate (IMDUR) 30 MG 24 hr tablet Take 30 mg by mouth daily. 06/05/20  Yes [provider]  levocetirizine (XYZAL) 5 MG tablet Take 5 mg by mouth at bedtime. 04/22/20  Yes [provider]  losartan (COZAAR) 25 MG tablet Take 25 mg by mouth daily. 06/05/20  Yes [provider]  nitroGLYCERIN (NITROSTAT) 0.4 MG SL tablet SMARTSIG:1 Sublingual 4-5 Times Daily 06/09/20  Yes [provider]  calcium carbonate (CALCIUM 600) 600 MG TABS tablet Take 2 tablets (1,200 mg total) by mouth daily with breakfast. 10/31/19 04/28/20  Milinda Pointer, MD  esomeprazole (NEXIUM) 20 MG capsule Take 20 mg by mouth every morning.     [provider]  metoprolol succinate (TOPROL-XL) 50 MG 24 hr tablet Take 50 mg by mouth daily. 09/26/19   [provider]  pregabalin (LYRICA) 50 MG capsule Take 1 capsule (50 mg total) by mouth 3 (three) times daily. 12/30/19 03/29/20  Milinda Pointer, MD  traMADol (ULTRAM) 50 MG tablet Take 1-2 tablets (50-100 mg total) by mouth at bedtime. Must last 30 days 12/30/19  03/29/20  Milinda Pointer, MD  DULoxetine (CYMBALTA) 60 MG capsule Take 120 mg by mouth daily. Patient not taking: No sig reported 11/21/19 01/23/21  [provider]  hydrochlorothiazide (MICROZIDE) 12.5 MG capsule Take 12.5-25 mg by mouth daily as needed. Patient not taking: No sig reported 10/01/19 01/23/21  [provider]  metFORMIN (GLUCOPHAGE) 500 MG tablet Take 500 mg by mouth 2 (two) times daily. 10/12/19 01/23/21  [provider]    Family History Family History  Problem Relation Age of Onset  . Heart disease Mother   . Stroke Mother   . Heart disease Father     Social History Social History   Tobacco Use  . Smoking status: Former Smoker    Quit date: 01/27/2000    Years since quitting: 21.0  . Smokeless tobacco: Never Used  Vaping Use  . Vaping Use: Never used  Substance Use Topics  . Alcohol use: Not Currently  . Drug use: Not Currently     Allergies   Latex, Tape, and Latex   Review of Systems Review of Systems  Constitutional: Negative for fever.  Respiratory: Positive for cough.    Physical Exam Triage Vital Signs ED Triage Vitals  Enc Vitals Group     BP 01/23/21 1613 138/82     Pulse Rate 01/23/21 1613 (!) 104     Resp 01/23/21 1613 18     Temp 01/23/21 1613 99.2 F (37.3 C)     Temp Source 01/23/21 1613 Oral     SpO2 01/23/21 1613 96 %     Weight 01/23/21 1610 218 lb (98.9 kg)     Height 01/23/21 1610 5\' 11"  (1.803 m)     Head Circumference --      Peak Flow --      Pain Score 01/23/21 1610 0     Pain Loc --      Pain Edu? --      Excl. in [place]? --    Updated Vital Signs BP 138/82 (BP Location: Left Arm)   Pulse (!) 104   Temp 99.2 F (37.3 C) (Oral)   Resp 18   Ht 5\' 11"  (1.803 m)   Wt 98.9 kg   SpO2 96%   BMI 30.40 kg/m   Visual Acuity Right Eye Distance:   Left Eye Distance:   Bilateral Distance:    Right Eye Near:   Left Eye Near:    Bilateral Near:     Physical Exam Vitals and nursing note reviewed.   Constitutional:      General: She is not in acute distress.    Appearance:  Normal appearance.  HENT:     Head: Normocephalic and atraumatic.  Eyes:     General:        Right eye: No discharge.        Left eye: No discharge.     Conjunctiva/sclera: Conjunctivae normal.  Cardiovascular:     Rate and Rhythm: Regular rhythm. Tachycardia present.  Pulmonary:     Effort: Pulmonary effort is normal.     Comments: Coarse breath sounds. Neurological:     Mental Status: She is alert.  Psychiatric:        Mood and Affect: Mood normal.        Behavior: Behavior normal.    UC Treatments / Results  Labs (all labs ordered are listed, but only abnormal results are displayed) Labs Reviewed - No data to display  EKG   Radiology DG Chest 2 View  Result Date: 01/23/2021 CLINICAL DATA:  Cough x 3 weeks EXAM: CHEST - 2 VIEW COMPARISON:  12/22/2019 FINDINGS: The mediastinal contours are within normal limits. No cardiomegaly. Similar appearing cicatricial atelectasis in the anterior right mid lung. The lungs are otherwise clear bilaterally without evidence of focal consolidation, pleural effusion, or pneumothorax. No acute osseous abnormality. IMPRESSION: No acute cardiopulmonary process. Unchanged chronic right midlung cicatricial atelectasis. Electronically Signed   By: Ruthann Cancer MD   On: 01/23/2021 16:57    Procedures Procedures (including critical care time)  Medications Ordered in UC Medications - No data to display  Initial Impression / Assessment and Plan / UC Course  I have reviewed the triage vital signs and the nursing notes.  Pertinent labs & imaging results that were available during my care of the patient were reviewed by me and considered in my medical decision making (see chart for details).    60 year old female presents with COPD exacerbation.  Chest x-ray was obtained given persistence of symptoms.  Interpretation: No apparent pneumonia.  Treating with doxycycline.   Tussionex for cough.  Final Clinical Impressions(s) / UC Diagnoses   Final diagnoses:  COPD exacerbation [facility])   Discharge Instructions   None    ED Prescriptions    Medication Sig Dispense Auth. Provider   doxycycline (VIBRAMYCIN) 100 MG capsule Take 1 capsule (100 mg total) by mouth 2 (two) times daily. 14 capsule [place], [place] G, DO   chlorpheniramine-HYDROcodone (TUSSIONEX PENNKINETIC ER) 10-8 MG/5ML SUER Take 5 mLs by mouth every 12 (twelve) hours as needed for cough. 115 mL Coral Spikes, DO     PDMP not reviewed this encounter.   Coral Spikes, [place] 01/23/21 1732

## 2021-08-21 ENCOUNTER — Ambulatory Visit
Admission: EM | Admit: 2021-08-21 | Discharge: 2021-08-21 | Disposition: A | Discharge status: Home / Self Care | Payer: BC Managed Care – PPO

## 2021-08-21 ENCOUNTER — Other Ambulatory Visit: Payer: Self-pay

## 2021-08-21 DIAGNOSIS — L03032 Cellulitis of left toe: Secondary | ICD-10-CM

## 2021-08-21 DIAGNOSIS — S91302A Unspecified open wound, left foot, initial encounter: Secondary | ICD-10-CM

## 2021-08-21 MED ORDER — CEPHALEXIN 500 MG PO CAPS: 500.0000 mg | ORAL_CAPSULE | Freq: Four times a day (QID) | ORAL | 0 refills | Status: AC

## 2021-08-21 NOTE — Discharge Instructions (Addendum)
-  You have a mild infection. Sent antibiotics to pharmacy  -Daily with soap and water.  Make sure the area staying dry.  Looks like its been wet for a long time.  Change bandage at least 2 times a day, more often if it looks wet. - If the wounds have not healed over the next week or are not looking better, follow-up with PCP as you may need wound care.  Follow-up sooner for any signs of worsening infection.

## 2021-08-21 NOTE — ED Triage Notes (Signed)
Patient is here for "Foot Pain' (Left). No injury known. "No feeling in feet, normally". About 2 days ago noticed bleeding, and sore's under great toe & 2nd toe. History of Neuropathy. Swelling of great toe noticed too.

## 2021-08-21 NOTE — ED Provider Notes (Signed)
[facility]    CSN: [id number] Arrival date & time: 08/21/21  1219      History   Chief Complaint Chief Complaint  Patient presents with   Foot Pain    Left     HPI Grace Hester is a 61 y.o. female presenting for open wounds of left foot.  Patient reports that she noticed it a couple days ago.  Denies any injury.  Patient reports that she has very bad polyneuropathy of her feet and chronic foot pain.  Reports occasional sharp pains in her feet but that is consistent with her neuropathy.  No associated fevers.  She is concerned because her great toe looks more swollen than normal and she noticed some redness about the great toe as well.  Has been cleaning it with soap and water and apply Neosporin and a Band-Aid once a day.  Reports that she noticed bleeding on her sock.  No other complaints.  HPI  Past Medical History:  Diagnosis Date   Abdominal pain    Acid reflux 01/04/2014   Acute respiratory failure with hypoxemia (HCC) 09/20/2014   Anemia    Anxiety    Arthritis    Asthma    Benign neoplasm of sigmoid colon    Billowing mitral valve    Calculus of kidney 12/31/3265   Complication of anesthesia    pneumonia after anesthesia   COPD (chronic obstructive pulmonary disease) (HCC)    Depression    Diabetes mellitus without complication ([place])    Diverticulitis    Essential (primary) hypertension 01/10/2015   GERD (gastroesophageal reflux disease)    Hypertension    Incomplete emptying of bladder 07/05/2014   Kidney stones    Neuropathy    PVC (premature ventricular contraction)    Sciatica of left side    Septic shock ([place]) 2015   Sigmoid diverticulitis 01/27/2017   Sleep apnea    CPAP   Tachycardia 04/17/2014   Thrombocytopenia ([place]) 09/20/2014    Patient Active Problem List   Diagnosis Date Noted   Type 2 diabetes mellitus with circulatory disorder, without long-term current use of insulin ([place]) 06/01/2020   Osteoarthritis of hips (Bilateral)  10/31/2019   Levoscoliosis 10/31/2019   Calcaneal spur of feet (Bilateral) 10/31/2019   Neurogenic pain 10/31/2019   Vitamin D insufficiency 10/31/2019   Polyneuropathy, peripheral sensorimotor axonal 10/03/2019   Chronic feet pain (Bilateral) 10/03/2019   Chronic ankle pain (Bilateral) 10/03/2019   Chronic hip pain (Bilateral) (L>R) 10/03/2019   Chronic knee pain (Bilateral) (R>L) 10/03/2019   Chronic lower extremity pain (Bilateral) 10/03/2019   Chronic hand pain (Bilateral) 10/03/2019   History of lumbar surgery 10/03/2019   Abnormal nerve conduction studies 10/03/2019   Complaints of weakness of lower extremity 10/03/2019   Anemia 09/29/2019   Chronic pain syndrome 09/29/2019   Pharmacologic therapy 09/29/2019   Disorder of skeletal system 09/29/2019   Problems influencing health status 09/29/2019   S/P laparoscopic colectomy 09/21/2017   Diverticulitis of colon without hemorrhage    Benign neoplasm of ascending colon    Palpitations 06/16/2017   Loose stools 01/27/2017   Sigmoid diverticulitis 01/27/2017   Acute diverticulitis 10/28/2016   Benign neoplasm of sigmoid colon    Other diseases of stomach and duodenum    Noninfectious gastroenteritis, unspecified    Diarrhea    Gastritis    Absolute anemia 02/13/2016   Anxiety 02/13/2016   Billowing mitral valve 02/13/2016   Beat, premature ventricular 02/13/2016   Diarrhea, functional  01/15/2016   Chronic cystitis 07/12/2015   Pneumonia 04/28/2015   Benign essential HTN 03/26/2015   Essential (primary) hypertension 01/10/2015   Proteus mirabilis infection 09/27/2014   Thrombocytopenia ([place]) 09/20/2014   Septic shock ([place]) 09/18/2014   Acute cystitis 07/07/2014   Dysuria 07/05/2014   Incomplete emptying of bladder 07/05/2014   Tachycardia 04/17/2014   Weakness 04/17/2014   Acid reflux 01/04/2014   Calculus of kidney 02/07/2013   History of urinary stone 09/15/2012   Mixed urge and stress incontinence 08/06/2012     Past Surgical History:  Procedure Laterality Date   ABDOMINAL HYSTERECTOMY     AUGMENTATION MAMMAPLASTY  2008   COLECTOMY     COLONOSCOPY WITH PROPOFOL N/A 03/14/2016   Procedure: COLONOSCOPY WITH PROPOFOL;  Surgeon: Lucilla Lame, MD;  Location: [place];  Service: Endoscopy;  Laterality: N/A;   COLONOSCOPY WITH PROPOFOL N/A 07/27/2017   Procedure: COLONOSCOPY WITH Biopsy;  Surgeon: Lucilla Lame, MD;  Location: [place];  Service: Endoscopy;  Laterality: N/A;   ESOPHAGOGASTRODUODENOSCOPY (EGD) WITH PROPOFOL N/A 03/14/2016   Procedure: ESOPHAGOGASTRODUODENOSCOPY (EGD) WITH PROPOFOL;  Surgeon: Lucilla Lame, MD;  Location: [place];  Service: Endoscopy;  Laterality: N/A;   FLEXOR TENDON REPAIR Left 08/04/13   Dr. Elvina Mattes, [place] Left    IMAGE GUIDED SINUS SURGERY     KIDNEY STONE SURGERY  2015   LAPAROSCOPIC SIGMOID COLECTOMY N/A 09/21/2017   Procedure: LAPAROSCOPIC SIGMOID COLECTOMY;  Surgeon: Jules Husbands, MD;  Location: [facility];  Service: General;  Laterality: N/A;   POLYPECTOMY  03/14/2016   Procedure: POLYPECTOMY;  Surgeon: Lucilla Lame, MD;  Location: [place];  Service: Endoscopy;;   POLYPECTOMY  07/27/2017   Procedure: POLYPECTOMY INTESTINAL;  Surgeon: Lucilla Lame, MD;  Location: [place];  Service: Endoscopy;;  Ascending colon polyp   ROTATOR CUFF REPAIR Left     OB History   No obstetric history on file.      Home Medications    Prior to Admission medications   Medication Sig Start Date End Date Taking? Authorizing Provider  amitriptyline (ELAVIL) 10 MG tablet  12/17/19  Yes [provider]  atorvastatin (LIPITOR) 40 MG tablet Take 40 mg by mouth at bedtime. 06/02/20  Yes [provider]  busPIRone (BUSPAR) 10 MG tablet Take 10 mg by mouth 2 (two) times daily. 2 tabs twice per day   Yes [provider]  cephALEXin (KEFLEX) 500 MG capsule Take 1 capsule (500 mg total) by  mouth 4 (four) times daily for 7 days. 08/21/21 08/28/21 Yes Danton Clap, PA-C  clopidogrel (PLAVIX) 75 MG tablet Take 75 mg by mouth daily. 06/02/20  Yes [provider]  levocetirizine (XYZAL) 5 MG tablet Take 5 mg by mouth at bedtime. 04/22/20  Yes [provider]  losartan (COZAAR) 25 MG tablet Take 25 mg by mouth daily. 06/05/20  Yes [provider]  metoprolol succinate (TOPROL-XL) 100 MG 24 hr tablet Take 1 tablet by mouth daily. 10/03/20  Yes [provider]  nitroGLYCERIN (NITROSTAT) 0.4 MG SL tablet SMARTSIG:1 Sublingual 4-5 Times Daily 06/09/20  Yes [provider]  hydrochlorothiazide (MICROZIDE) 12.5 MG capsule Take 12.5-25 mg by mouth daily as needed. 10/01/19 01/23/21 Yes [provider]  metFORMIN (GLUCOPHAGE) 500 MG tablet Take 500 mg by mouth 2 (two) times daily. 10/12/19 01/23/21 Yes [provider]  albuterol (PROVENTIL HFA;VENTOLIN HFA) 108 (90 Base) MCG/ACT inhaler Inhale 1-2 puffs into the lungs  every 6 (six) hours as needed for wheezing or shortness of breath. 04/03/16   Lorin Picket, PA-C  albuterol (VENTOLIN HFA) 108 [phone] Base) MCG/ACT inhaler  12/21/19   [provider]  calcium carbonate (CALCIUM 600) 600 MG TABS tablet Take 2 tablets (1,200 mg total) by mouth daily with breakfast. 10/31/19 04/28/20  Milinda Pointer, MD  chlorpheniramine-HYDROcodone (TUSSIONEX PENNKINETIC ER) 10-8 MG/5ML SUER Take 5 mLs by mouth every 12 (twelve) hours as needed for cough. 01/23/21   Coral Spikes, DO  [employer] D3 125 MCG (5000 UT) capsule Take 5,000 Units by mouth every morning. 10/31/19   [provider]  doxepin (SINEQUAN) 50 MG capsule Take 50 mg by mouth at bedtime. 05/14/20   [provider]  doxycycline (VIBRAMYCIN) 100 MG capsule Take 1 capsule (100 mg total) by mouth 2 (two) times daily. 01/23/21   Coral Spikes, DO  esomeprazole (NEXIUM) 20 MG capsule Take 20 mg by mouth every morning.     [provider]  EUCRISA 2 % OINT Apply 1 application topically at bedtime. 07/01/17   [provider]  fexofenadine (ALLEGRA) 180 MG tablet Take 180 mg by mouth daily.     [provider]  isosorbide mononitrate (IMDUR) 30 MG 24 hr tablet Take 30 mg by mouth daily. 06/05/20   [provider]  metoprolol succinate (TOPROL-XL) 50 MG 24 hr tablet Take 50 mg by mouth daily. 09/26/19   [provider]  pregabalin (LYRICA) 50 MG capsule Take 1 capsule (50 mg total) by mouth 3 (three) times daily. 12/30/19 08/21/21  Milinda Pointer, MD  traMADol (ULTRAM) 50 MG tablet Take 1-2 tablets (50-100 mg total) by mouth at bedtime. Must last 30 days 12/30/19 03/29/20  Milinda Pointer, MD  DULoxetine (CYMBALTA) 60 MG capsule Take 120 mg by mouth daily. Patient not taking: No sig reported 11/21/19 01/23/21  [provider]    Family History Family History  Problem Relation Age of Onset   Heart disease Mother    Stroke Mother    Heart disease Father     Social History Social History   Tobacco Use   Smoking status: Former    Types: Cigarettes    Quit date: 01/27/2000    Years since quitting: 21.5   Smokeless tobacco: Never  Vaping Use   Vaping Use: Never used  Substance Use Topics   Alcohol use: Not Currently   Drug use: Not Currently     Allergies   Latex, Tape, Isosorbide nitrate, Latex, and Metformin   Review of Systems Review of Systems  Constitutional:  Negative for fatigue and fever.  Musculoskeletal:  Positive for arthralgias and joint swelling. Negative for gait problem.  Skin:  Positive for color change and wound.  Neurological:  Positive for numbness. Negative for weakness.    Physical Exam Triage Vital Signs ED Triage Vitals [08/21/21 1244]  Enc Vitals Group     BP 133/75     Pulse Rate 90     Resp 20     Temp 98.7 F (37.1 C)     Temp Source Oral     SpO2 97 %     Weight 215 lb (97.5 kg)     Height      Head Circumference      Peak Flow       Pain Score 6     Pain Loc      Pain Edu?      Excl. in [place]?  No data found.  Updated Vital Signs BP 133/75 (BP Location: Left Arm)    Pulse 90    Temp 98.7 F (37.1 C) (Oral)    Resp 20    Wt 215 lb (97.5 kg)    SpO2 97%    BMI 29.99 kg/m        Physical Exam Vitals and nursing note reviewed.  Constitutional:      General: She is not in acute distress.    Appearance: Normal appearance. She is not ill-appearing or toxic-appearing.  HENT:     Head: Normocephalic and atraumatic.  Eyes:     General: No scleral icterus.       Right eye: No discharge.        Left eye: No discharge.     Conjunctiva/sclera: Conjunctivae normal.  Cardiovascular:     Rate and Rhythm: Normal rate.     Pulses: Normal pulses.  Pulmonary:     Effort: Pulmonary effort is normal. No respiratory distress.  Musculoskeletal:     Cervical back: Neck supple.  Skin:    General: Skin is dry.     Comments: LEFT FOOT: Erythema and swelling at base of great toe.  2 open wounds of the plantar foot and bases of great toe and second digit.  Skin is macerated.  No tenderness palpation but patient does not have good sensation.  Neurological:     General: No focal deficit present.     Mental Status: She is alert. Mental status is at baseline.     Motor: No weakness.     Gait: Gait normal.  Psychiatric:        Mood and Affect: Mood normal.        Behavior: Behavior normal.        Thought Content: Thought content normal.          UC Treatments / Results  Labs (all labs ordered are listed, but only abnormal results are displayed) Labs Reviewed - No data to display  EKG   Radiology No results found.  Procedures Procedures (including critical care time)  Medications Ordered in UC Medications - No data to display  Initial Impression / Assessment and Plan / UC Course  I have reviewed the triage vital signs and the nursing notes.  Pertinent labs & imaging results that were available during my  care of the patient were reviewed by me and considered in my medical decision making (see chart for details).  60 year old female presenting for left great toe redness and swelling as well as to open wounds of the plantar left foot.  No injury.  Patient has severe polyneuropathy.  Photos in chart of her foot.  Suspicious for cellulitis of the great toe.  Open wounds look to be secondary to blisters.  Skin is macerated.  Advised her she needs to make sure to keep the foot clean and dry.  Advised changing the bandage twice a day.  Advised following up with PCP if the wounds are not healing over the next week as she may need wound care.  I am concerned that she has a neuropathy so I made sure that she knows to check the foot every day.  Advise going to ED for any acute worsening of her symptoms including fever, pain, swelling.   Final Clinical Impressions(s) / UC Diagnoses   Final diagnoses:  Cellulitis of right toe  Open wound of right foot, initial encounter     Discharge Instructions      -  You have a mild infection. Sent antibiotics to pharmacy  -Daily with soap and water.  Make sure the area staying dry.  Looks like its been wet for a long time.  Change bandage at least 2 times a day, more often if it looks wet. - If the wounds have not healed over the next week or are not looking better, follow-up with PCP as you may need wound care.  Follow-up sooner for any signs of worsening infection.     ED Prescriptions     Medication Sig Dispense Auth. Provider   cephALEXin (KEFLEX) 500 MG capsule Take 1 capsule (500 mg total) by mouth 4 (four) times daily for 7 days. 28 capsule Danton Clap, PA-C      PDMP not reviewed this encounter.   Danton Clap, PA-C 08/21/21 1606

## 2023-06-02 ENCOUNTER — Ambulatory Visit: Payer: BC Managed Care – PPO

## 2023-06-02 DIAGNOSIS — Z23 Encounter for immunization: Secondary | ICD-10-CM | POA: Diagnosis not present

## 2024-05-31 ENCOUNTER — Ambulatory Visit

## 2024-05-31 DIAGNOSIS — Z23 Encounter for immunization: Secondary | ICD-10-CM
